# Patient Record
Sex: Female | Born: 1955 | Race: Black or African American | Hispanic: No | Marital: Married | Smoking: Former smoker
Health system: Southern US, Community
[De-identification: ages and names within clinical notes are randomized; demographics above are authoritative.]

## PROBLEM LIST (undated history)

## (undated) DIAGNOSIS — N186 End stage renal disease: Secondary | ICD-10-CM

## (undated) DIAGNOSIS — Z992 Dependence on renal dialysis: Secondary | ICD-10-CM

## (undated) DIAGNOSIS — F32A Depression, unspecified: Secondary | ICD-10-CM

## (undated) DIAGNOSIS — I10 Essential (primary) hypertension: Secondary | ICD-10-CM

## (undated) DIAGNOSIS — Z87440 Personal history of urinary (tract) infections: Secondary | ICD-10-CM

## (undated) DIAGNOSIS — Z8673 Personal history of transient ischemic attack (TIA), and cerebral infarction without residual deficits: Secondary | ICD-10-CM

## (undated) DIAGNOSIS — R58 Hemorrhage, not elsewhere classified: Secondary | ICD-10-CM

## (undated) DIAGNOSIS — D649 Anemia, unspecified: Secondary | ICD-10-CM

## (undated) DIAGNOSIS — E079 Disorder of thyroid, unspecified: Secondary | ICD-10-CM

## (undated) DIAGNOSIS — I5032 Chronic diastolic (congestive) heart failure: Secondary | ICD-10-CM

## (undated) DIAGNOSIS — E213 Hyperparathyroidism, unspecified: Secondary | ICD-10-CM

## (undated) DIAGNOSIS — I219 Acute myocardial infarction, unspecified: Secondary | ICD-10-CM

## (undated) DIAGNOSIS — G4733 Obstructive sleep apnea (adult) (pediatric): Secondary | ICD-10-CM

## (undated) DIAGNOSIS — F329 Major depressive disorder, single episode, unspecified: Secondary | ICD-10-CM

## (undated) DIAGNOSIS — E785 Hyperlipidemia, unspecified: Secondary | ICD-10-CM

## (undated) DIAGNOSIS — M1990 Unspecified osteoarthritis, unspecified site: Secondary | ICD-10-CM

## (undated) DIAGNOSIS — I6529 Occlusion and stenosis of unspecified carotid artery: Secondary | ICD-10-CM

## (undated) DIAGNOSIS — J189 Pneumonia, unspecified organism: Secondary | ICD-10-CM

## (undated) HISTORY — DX: Hyperlipidemia, unspecified: E78.5

## (undated) HISTORY — PX: ABDOMINAL HYSTERECTOMY: SHX81

## (undated) HISTORY — DX: Obstructive sleep apnea (adult) (pediatric): G47.33

## (undated) HISTORY — DX: Personal history of transient ischemic attack (TIA), and cerebral infarction without residual deficits: Z86.73

## (undated) HISTORY — DX: Personal history of urinary (tract) infections: Z87.440

## (undated) HISTORY — DX: Major depressive disorder, single episode, unspecified: F32.9

## (undated) HISTORY — DX: Occlusion and stenosis of unspecified carotid artery: I65.29

## (undated) HISTORY — PX: KIDNEY TRANSPLANT: SHX239

## (undated) HISTORY — PX: HERNIA REPAIR: SHX51

## (undated) HISTORY — DX: Anemia, unspecified: D64.9

## (undated) HISTORY — PX: CHOLECYSTECTOMY: SHX55

## (undated) HISTORY — DX: Hyperparathyroidism, unspecified: E21.3

## (undated) HISTORY — DX: Chronic diastolic (congestive) heart failure: I50.32

## (undated) HISTORY — DX: Essential (primary) hypertension: I10

## (undated) HISTORY — DX: Hemorrhage, not elsewhere classified: R58

## (undated) HISTORY — DX: Disorder of thyroid, unspecified: E07.9

## (undated) HISTORY — DX: Depression, unspecified: F32.A

## (undated) HISTORY — PX: OTHER SURGICAL HISTORY: SHX169

## (undated) HISTORY — PX: EYE SURGERY: SHX253

---

## 1998-11-23 ENCOUNTER — Emergency Department (HOSPITAL_COMMUNITY): Admission: EM | Admit: 1998-11-23 | Discharge: 1998-11-24 | Payer: Self-pay | Admitting: Emergency Medicine

## 2000-05-23 ENCOUNTER — Encounter: Admission: RE | Admit: 2000-05-23 | Discharge: 2000-08-21 | Payer: Self-pay | Admitting: Pulmonary Disease

## 2000-05-31 ENCOUNTER — Ambulatory Visit (HOSPITAL_COMMUNITY): Admission: RE | Admit: 2000-05-31 | Discharge: 2000-05-31 | Payer: Self-pay | Admitting: *Deleted

## 2000-06-02 ENCOUNTER — Emergency Department (HOSPITAL_COMMUNITY): Admission: EM | Admit: 2000-06-02 | Discharge: 2000-06-02 | Payer: Self-pay | Admitting: Emergency Medicine

## 2000-06-03 ENCOUNTER — Emergency Department (HOSPITAL_COMMUNITY): Admission: EM | Admit: 2000-06-03 | Discharge: 2000-06-03 | Payer: Self-pay | Admitting: Emergency Medicine

## 2000-11-02 ENCOUNTER — Encounter: Admission: RE | Admit: 2000-11-02 | Discharge: 2000-11-02 | Payer: Self-pay | Admitting: Orthopedic Surgery

## 2000-11-02 ENCOUNTER — Encounter: Payer: Self-pay | Admitting: Orthopedic Surgery

## 2000-11-21 ENCOUNTER — Encounter: Payer: Self-pay | Admitting: Orthopedic Surgery

## 2000-11-21 ENCOUNTER — Ambulatory Visit (HOSPITAL_COMMUNITY): Admission: RE | Admit: 2000-11-21 | Discharge: 2000-11-21 | Payer: Self-pay | Admitting: Orthopedic Surgery

## 2001-01-23 ENCOUNTER — Ambulatory Visit (HOSPITAL_COMMUNITY): Admission: RE | Admit: 2001-01-23 | Discharge: 2001-01-23 | Payer: Self-pay | Admitting: Gastroenterology

## 2001-02-15 ENCOUNTER — Encounter: Admission: RE | Admit: 2001-02-15 | Discharge: 2001-02-15 | Payer: Self-pay | Admitting: Internal Medicine

## 2001-02-15 ENCOUNTER — Encounter: Payer: Self-pay | Admitting: Internal Medicine

## 2001-03-20 ENCOUNTER — Encounter: Payer: Self-pay | Admitting: Emergency Medicine

## 2001-03-20 ENCOUNTER — Encounter: Admission: RE | Admit: 2001-03-20 | Discharge: 2001-03-20 | Payer: Self-pay | Admitting: Orthopedic Surgery

## 2001-03-20 ENCOUNTER — Emergency Department (HOSPITAL_COMMUNITY): Admission: EM | Admit: 2001-03-20 | Discharge: 2001-03-20 | Payer: Self-pay | Admitting: Emergency Medicine

## 2001-03-20 ENCOUNTER — Encounter: Payer: Self-pay | Admitting: Orthopedic Surgery

## 2001-04-20 ENCOUNTER — Ambulatory Visit (HOSPITAL_COMMUNITY): Admission: RE | Admit: 2001-04-20 | Discharge: 2001-04-20 | Payer: Self-pay | Admitting: Orthopedic Surgery

## 2001-04-20 ENCOUNTER — Encounter: Payer: Self-pay | Admitting: Orthopedic Surgery

## 2001-05-07 ENCOUNTER — Encounter: Admission: RE | Admit: 2001-05-07 | Discharge: 2001-05-16 | Payer: Self-pay | Admitting: Pulmonary Disease

## 2001-07-18 ENCOUNTER — Encounter: Admission: RE | Admit: 2001-07-18 | Discharge: 2001-07-18 | Payer: Self-pay

## 2001-07-18 ENCOUNTER — Encounter: Payer: Self-pay | Admitting: Internal Medicine

## 2001-08-22 ENCOUNTER — Other Ambulatory Visit: Admission: RE | Admit: 2001-08-22 | Discharge: 2001-08-22 | Payer: Self-pay | Admitting: Internal Medicine

## 2002-01-03 ENCOUNTER — Ambulatory Visit (HOSPITAL_COMMUNITY): Admission: RE | Admit: 2002-01-03 | Discharge: 2002-01-03 | Payer: Self-pay | Admitting: Gastroenterology

## 2002-01-03 ENCOUNTER — Encounter (INDEPENDENT_AMBULATORY_CARE_PROVIDER_SITE_OTHER): Payer: Self-pay | Admitting: Specialist

## 2002-02-11 ENCOUNTER — Encounter: Payer: Self-pay | Admitting: Internal Medicine

## 2002-02-11 ENCOUNTER — Encounter: Admission: RE | Admit: 2002-02-11 | Discharge: 2002-02-11 | Payer: Self-pay | Admitting: Internal Medicine

## 2002-02-26 ENCOUNTER — Ambulatory Visit (HOSPITAL_COMMUNITY): Admission: RE | Admit: 2002-02-26 | Discharge: 2002-02-26 | Payer: Self-pay | Admitting: Internal Medicine

## 2002-03-18 ENCOUNTER — Encounter: Payer: Self-pay | Admitting: Internal Medicine

## 2002-03-18 ENCOUNTER — Inpatient Hospital Stay (HOSPITAL_COMMUNITY): Admission: RE | Admit: 2002-03-18 | Discharge: 2002-03-21 | Payer: Self-pay | Admitting: Internal Medicine

## 2002-03-19 ENCOUNTER — Encounter (INDEPENDENT_AMBULATORY_CARE_PROVIDER_SITE_OTHER): Payer: Self-pay | Admitting: Cardiology

## 2002-07-07 ENCOUNTER — Encounter: Payer: Self-pay | Admitting: Emergency Medicine

## 2002-07-07 ENCOUNTER — Emergency Department (HOSPITAL_COMMUNITY): Admission: EM | Admit: 2002-07-07 | Discharge: 2002-07-07 | Payer: Self-pay | Admitting: Emergency Medicine

## 2003-02-05 ENCOUNTER — Ambulatory Visit (HOSPITAL_COMMUNITY): Admission: RE | Admit: 2003-02-05 | Discharge: 2003-02-05 | Payer: Self-pay | Admitting: Gastroenterology

## 2003-02-05 ENCOUNTER — Encounter (INDEPENDENT_AMBULATORY_CARE_PROVIDER_SITE_OTHER): Payer: Self-pay | Admitting: Specialist

## 2003-02-27 ENCOUNTER — Ambulatory Visit (HOSPITAL_COMMUNITY): Admission: RE | Admit: 2003-02-27 | Discharge: 2003-02-27 | Payer: Self-pay | Admitting: Internal Medicine

## 2003-02-27 ENCOUNTER — Encounter: Payer: Self-pay | Admitting: Internal Medicine

## 2003-09-28 ENCOUNTER — Inpatient Hospital Stay (HOSPITAL_COMMUNITY): Admission: EM | Admit: 2003-09-28 | Discharge: 2003-10-01 | Payer: Self-pay | Admitting: *Deleted

## 2005-07-21 ENCOUNTER — Encounter: Admission: RE | Admit: 2005-07-21 | Discharge: 2005-07-21 | Payer: Self-pay | Admitting: Nephrology

## 2005-08-02 ENCOUNTER — Encounter (HOSPITAL_COMMUNITY): Admission: RE | Admit: 2005-08-02 | Discharge: 2005-10-31 | Payer: Self-pay | Admitting: Nephrology

## 2005-11-09 ENCOUNTER — Encounter (HOSPITAL_COMMUNITY): Admission: RE | Admit: 2005-11-09 | Discharge: 2006-02-07 | Payer: Self-pay | Admitting: Nephrology

## 2006-02-17 ENCOUNTER — Encounter (HOSPITAL_COMMUNITY): Admission: RE | Admit: 2006-02-17 | Discharge: 2006-05-18 | Payer: Self-pay | Admitting: Nephrology

## 2006-03-02 ENCOUNTER — Emergency Department (HOSPITAL_COMMUNITY): Admission: EM | Admit: 2006-03-02 | Discharge: 2006-03-02 | Payer: Self-pay | Admitting: Emergency Medicine

## 2006-06-02 ENCOUNTER — Encounter: Admission: RE | Admit: 2006-06-02 | Discharge: 2006-06-02 | Payer: Self-pay | Admitting: Internal Medicine

## 2006-06-20 ENCOUNTER — Encounter (HOSPITAL_COMMUNITY): Admission: RE | Admit: 2006-06-20 | Discharge: 2006-09-18 | Payer: Self-pay | Admitting: Nephrology

## 2006-10-31 ENCOUNTER — Encounter (HOSPITAL_COMMUNITY): Admission: RE | Admit: 2006-10-31 | Discharge: 2007-01-29 | Payer: Self-pay | Admitting: Nephrology

## 2007-02-26 ENCOUNTER — Encounter (HOSPITAL_COMMUNITY): Admission: RE | Admit: 2007-02-26 | Discharge: 2007-05-27 | Payer: Self-pay | Admitting: Nephrology

## 2007-06-19 ENCOUNTER — Emergency Department (HOSPITAL_COMMUNITY): Admission: EM | Admit: 2007-06-19 | Discharge: 2007-06-19 | Payer: Self-pay | Admitting: Emergency Medicine

## 2007-06-21 ENCOUNTER — Encounter (HOSPITAL_COMMUNITY): Admission: RE | Admit: 2007-06-21 | Discharge: 2007-09-19 | Payer: Self-pay | Admitting: Nephrology

## 2007-07-06 ENCOUNTER — Encounter: Admission: RE | Admit: 2007-07-06 | Discharge: 2007-07-06 | Payer: Self-pay | Admitting: Internal Medicine

## 2007-07-21 ENCOUNTER — Observation Stay (HOSPITAL_COMMUNITY): Admission: EM | Admit: 2007-07-21 | Discharge: 2007-07-22 | Payer: Self-pay | Admitting: Emergency Medicine

## 2007-07-21 ENCOUNTER — Encounter (INDEPENDENT_AMBULATORY_CARE_PROVIDER_SITE_OTHER): Payer: Self-pay | Admitting: Neurology

## 2007-09-20 ENCOUNTER — Encounter (HOSPITAL_COMMUNITY): Admission: RE | Admit: 2007-09-20 | Discharge: 2007-12-19 | Payer: Self-pay | Admitting: Nephrology

## 2007-12-20 ENCOUNTER — Encounter (HOSPITAL_COMMUNITY): Admission: RE | Admit: 2007-12-20 | Discharge: 2008-03-19 | Payer: Self-pay | Admitting: Nephrology

## 2008-02-27 ENCOUNTER — Emergency Department (HOSPITAL_COMMUNITY): Admission: EM | Admit: 2008-02-27 | Discharge: 2008-02-27 | Payer: Self-pay | Admitting: Emergency Medicine

## 2008-04-15 ENCOUNTER — Encounter (HOSPITAL_COMMUNITY): Admission: RE | Admit: 2008-04-15 | Discharge: 2008-07-14 | Payer: Self-pay | Admitting: Nephrology

## 2008-05-06 LAB — HM COLONOSCOPY

## 2008-06-17 ENCOUNTER — Emergency Department (HOSPITAL_COMMUNITY): Admission: EM | Admit: 2008-06-17 | Discharge: 2008-06-17 | Payer: Self-pay | Admitting: Emergency Medicine

## 2008-07-21 ENCOUNTER — Encounter: Admission: RE | Admit: 2008-07-21 | Discharge: 2008-07-21 | Payer: Self-pay | Admitting: Internal Medicine

## 2008-07-30 ENCOUNTER — Encounter (HOSPITAL_COMMUNITY): Admission: RE | Admit: 2008-07-30 | Discharge: 2008-10-16 | Payer: Self-pay | Admitting: Nephrology

## 2008-10-17 ENCOUNTER — Encounter (HOSPITAL_COMMUNITY): Admission: RE | Admit: 2008-10-17 | Discharge: 2009-01-15 | Payer: Self-pay | Admitting: Nephrology

## 2009-02-16 ENCOUNTER — Encounter (HOSPITAL_COMMUNITY): Admission: RE | Admit: 2009-02-16 | Discharge: 2009-05-17 | Payer: Self-pay | Admitting: Nephrology

## 2009-05-18 ENCOUNTER — Encounter (HOSPITAL_COMMUNITY): Admission: RE | Admit: 2009-05-18 | Discharge: 2009-08-16 | Payer: Self-pay | Admitting: Nephrology

## 2009-07-22 ENCOUNTER — Encounter: Admission: RE | Admit: 2009-07-22 | Discharge: 2009-07-22 | Payer: Self-pay | Admitting: Internal Medicine

## 2009-09-16 ENCOUNTER — Encounter (HOSPITAL_COMMUNITY): Admission: RE | Admit: 2009-09-16 | Discharge: 2009-12-15 | Payer: Self-pay | Admitting: Nephrology

## 2009-12-14 ENCOUNTER — Encounter: Payer: Self-pay | Admitting: Internal Medicine

## 2009-12-21 ENCOUNTER — Observation Stay (HOSPITAL_COMMUNITY): Admission: EM | Admit: 2009-12-21 | Discharge: 2009-12-23 | Payer: Self-pay | Admitting: Emergency Medicine

## 2010-01-08 ENCOUNTER — Encounter (HOSPITAL_COMMUNITY): Admission: RE | Admit: 2010-01-08 | Discharge: 2010-04-08 | Payer: Self-pay | Admitting: Nephrology

## 2010-01-20 ENCOUNTER — Encounter: Payer: Self-pay | Admitting: Internal Medicine

## 2010-01-29 ENCOUNTER — Emergency Department (HOSPITAL_COMMUNITY): Admission: EM | Admit: 2010-01-29 | Discharge: 2010-01-29 | Payer: Self-pay | Admitting: Emergency Medicine

## 2010-05-21 ENCOUNTER — Encounter: Payer: Self-pay | Admitting: Internal Medicine

## 2010-05-27 ENCOUNTER — Ambulatory Visit: Payer: Self-pay | Admitting: Internal Medicine

## 2010-05-27 ENCOUNTER — Encounter: Payer: Self-pay | Admitting: Internal Medicine

## 2010-05-27 DIAGNOSIS — R109 Unspecified abdominal pain: Secondary | ICD-10-CM

## 2010-05-27 DIAGNOSIS — E119 Type 2 diabetes mellitus without complications: Secondary | ICD-10-CM | POA: Insufficient documentation

## 2010-05-27 DIAGNOSIS — M79609 Pain in unspecified limb: Secondary | ICD-10-CM

## 2010-05-27 DIAGNOSIS — E785 Hyperlipidemia, unspecified: Secondary | ICD-10-CM

## 2010-05-27 DIAGNOSIS — I10 Essential (primary) hypertension: Secondary | ICD-10-CM

## 2010-05-27 DIAGNOSIS — N186 End stage renal disease: Secondary | ICD-10-CM | POA: Insufficient documentation

## 2010-05-27 DIAGNOSIS — D631 Anemia in chronic kidney disease: Secondary | ICD-10-CM

## 2010-05-27 DIAGNOSIS — K5909 Other constipation: Secondary | ICD-10-CM

## 2010-05-27 DIAGNOSIS — N189 Chronic kidney disease, unspecified: Secondary | ICD-10-CM

## 2010-05-27 LAB — CONVERTED CEMR LAB
Bilirubin Urine: NEGATIVE
Blood Glucose, AC Bkfst: 157 mg/dL
Blood in Urine, dipstick: NEGATIVE
Glucose, Urine, Semiquant: NEGATIVE
Hgb A1c MFr Bld: 7.8 %
Ketones, urine, test strip: NEGATIVE
Nitrite: POSITIVE
Specific Gravity, Urine: 1.01
Urobilinogen, UA: 0.2
pH: 5

## 2010-05-27 LAB — HM DIABETES FOOT EXAM

## 2010-05-28 LAB — CONVERTED CEMR LAB
ALT: 14 units/L (ref 0–35)
AST: 13 units/L (ref 0–37)
Albumin: 4.1 g/dL (ref 3.5–5.2)
Alkaline Phosphatase: 90 units/L (ref 39–117)
BUN: 49 mg/dL — ABNORMAL HIGH (ref 6–23)
CO2: 23 meq/L (ref 19–32)
Calcium, Total (PTH): 10 mg/dL (ref 8.4–10.5)
Calcium: 9.9 mg/dL (ref 8.4–10.5)
Chloride: 103 meq/L (ref 96–112)
Cholesterol: 286 mg/dL — ABNORMAL HIGH (ref 0–200)
Creatinine, Ser: 2 mg/dL — ABNORMAL HIGH (ref 0.40–1.20)
Creatinine, Urine: 40.4 mg/dL
Glucose, Bld: 128 mg/dL — ABNORMAL HIGH (ref 70–99)
HCT: 36.2 % (ref 36.0–46.0)
HDL: 48 mg/dL (ref >39)
Hemoglobin: 11.2 g/dL — ABNORMAL LOW (ref 12.0–15.0)
LDL Cholesterol: 208 mg/dL — ABNORMAL HIGH (ref 0–99)
MCHC: 30.9 g/dL (ref 30.0–36.0)
MCV: 84.8 fL (ref >=78.0)
Microalb Creat Ratio: 261.1 mg/g — ABNORMAL HIGH (ref 0.0–30.0)
Microalb, Ur: 10.55 mg/dL — ABNORMAL HIGH (ref 0.00–1.89)
PTH: 97 pg/mL — ABNORMAL HIGH (ref 14.0–72.0)
Platelets: 341 10*3/uL (ref 150–400)
Potassium: 5.4 meq/L — ABNORMAL HIGH (ref 3.5–5.3)
RBC: 4.27 M/uL (ref 3.87–5.11)
RDW: 14.3 % (ref 11.5–15.5)
Sodium: 138 meq/L (ref 135–145)
Total Bilirubin: 0.4 mg/dL (ref 0.3–1.2)
Total CHOL/HDL Ratio: 6
Total Protein: 7.7 g/dL (ref 6.0–8.3)
Triglycerides: 149 mg/dL (ref <150)
VLDL: 30 mg/dL (ref 0–40)
WBC: 11.9 10*3/uL — ABNORMAL HIGH (ref 4.0–10.5)

## 2010-06-08 ENCOUNTER — Encounter: Payer: Self-pay | Admitting: Internal Medicine

## 2010-06-08 ENCOUNTER — Ambulatory Visit: Payer: Self-pay | Admitting: Internal Medicine

## 2010-06-09 LAB — CONVERTED CEMR LAB
BUN: 49 mg/dL — ABNORMAL HIGH (ref 6–23)
CO2: 24 meq/L (ref 19–32)
Calcium: 9.4 mg/dL (ref 8.4–10.5)
Chloride: 103 meq/L (ref 96–112)
Creatinine, Ser: 1.99 mg/dL — ABNORMAL HIGH (ref 0.40–1.20)
Glucose, Bld: 65 mg/dL — ABNORMAL LOW (ref 70–99)
Potassium: 4.3 meq/L (ref 3.5–5.3)
Sodium: 138 meq/L (ref 135–145)

## 2010-06-15 ENCOUNTER — Telehealth: Payer: Self-pay | Admitting: Internal Medicine

## 2010-06-30 ENCOUNTER — Telehealth: Payer: Self-pay | Admitting: *Deleted

## 2010-07-07 ENCOUNTER — Encounter: Payer: Self-pay | Admitting: Internal Medicine

## 2010-07-28 ENCOUNTER — Ambulatory Visit (HOSPITAL_COMMUNITY): Admission: RE | Admit: 2010-07-28 | Discharge: 2010-07-28 | Payer: Self-pay | Admitting: Cardiology

## 2010-07-28 ENCOUNTER — Encounter (INDEPENDENT_AMBULATORY_CARE_PROVIDER_SITE_OTHER): Payer: Self-pay | Admitting: Cardiology

## 2010-08-20 ENCOUNTER — Telehealth: Payer: Self-pay | Admitting: Internal Medicine

## 2010-09-01 ENCOUNTER — Encounter (HOSPITAL_COMMUNITY)
Admission: RE | Admit: 2010-09-01 | Discharge: 2010-11-16 | Payer: Self-pay | Source: Home / Self Care | Attending: Nephrology | Admitting: Nephrology

## 2010-10-11 ENCOUNTER — Emergency Department (HOSPITAL_COMMUNITY)
Admission: EM | Admit: 2010-10-11 | Discharge: 2010-10-11 | Payer: Self-pay | Source: Home / Self Care | Admitting: Family Medicine

## 2010-10-13 ENCOUNTER — Emergency Department (HOSPITAL_COMMUNITY)
Admission: EM | Admit: 2010-10-13 | Discharge: 2010-10-13 | Payer: Self-pay | Source: Home / Self Care | Admitting: Family Medicine

## 2010-10-16 ENCOUNTER — Emergency Department (HOSPITAL_COMMUNITY)
Admission: EM | Admit: 2010-10-16 | Discharge: 2010-10-16 | Payer: Self-pay | Source: Home / Self Care | Admitting: Family Medicine

## 2010-10-19 ENCOUNTER — Emergency Department (HOSPITAL_COMMUNITY)
Admission: EM | Admit: 2010-10-19 | Discharge: 2010-10-19 | Payer: Self-pay | Source: Home / Self Care | Admitting: Family Medicine

## 2010-10-26 ENCOUNTER — Emergency Department (HOSPITAL_COMMUNITY)
Admission: EM | Admit: 2010-10-26 | Discharge: 2010-10-26 | Payer: Self-pay | Source: Home / Self Care | Admitting: Family Medicine

## 2010-11-18 NOTE — Progress Notes (Signed)
Summary: [facility] appt/ [facility]  Phone Note Call from Patient   Summary of Call: pt calls to say her cardiology appt is 07/22/2010 at 0930 at [facility] w/ dr Alford Highland Initial call taken by: Freddy Finner RN,  June 15, 2010 10:43 AM  Follow-up for Phone Call        Thank you - noted. Follow-up by: Rikki Spearing, MD,  June 15, 2010 11:20 AM

## 2010-11-18 NOTE — Assessment & Plan Note (Signed)
Summary: NEW PT/DIABETIC/RECORDS W/CHILON/DS   Vital Signs:  Patient profile:   55 year [name] female Height:      65 inches (165.10 cm) Weight:      207.7 pounds (94.41 kg) BMI:     34.69 Temp:     98.2 degrees F oral Pulse rate:   83 / minute BP sitting:   155 / 85  (right arm)  Vitals Entered By: Danielle Old RN (May 27, 2010 8:49 AM) CC: New to the clinic. Haing pain left flank and upper back. Is Patient Diabetic? Yes Did you bring your meter with you today? Yes Pain Assessment Patient in pain? yes     Location: left flank/back Intensity: 7 Type: sharp Onset of pain  Intermittent Nutritional Status BMI of > 30 = obese  Have you ever been in a relationship where you felt threatened, hurt or afraid?Unable to ask; daughter w/pt.   Does patient need assistance? Functional Status Self care Ambulation Normal   Primary Care Provider:  Rikki Spearing, MD  CC:  New to the clinic. Haing pain left flank and upper back..  History of Present Illness: Patient is a new patient to the clinic and would like to establish care with [place]. Patient has h/o DM (on insulin), HTN, hyperlipidemia, and Grade 3 renal failure (not on dialysis).   Her job ended and so now she is uninsured.  Previously seen by Dr. Baird [name].  The patient ses Dr. Jane [name] (renal)  - she is sponsored by [employer] for 1 year to receive her epogen.  Dr. Carlis [name] (endocrinology) is following her diabetes. She will continue to see renal and endocrine physicians through payment plan..    For the past 5 days the patient has had pain in her upper back that wraps around her front on the left side.  She wakes up with it hurting and it can last through the day.  Sharp pain.  Waxing and waning.  Tried tylenol and this helps some. No dyruria.  Increased frequency.  Increased volume of urine.  No fevers.  With this some periumbillical pain that is sharp and waxing and waning.  Leg pain for the past 2 months; starts in her  lateral left ankle, shoots up her leg and into her back.  It lasts until she leans over, and it will gradually go away.  Sharp pain.  Leaning forward is the only thing that makes it better.  She has tried taking tylenol for it and it does help. No bowel or bladder incontinence.  Patient states she has long standing numbness and tingling in her feet.  This happens 2x/week and is increasing in frequency.  Patient is scared to cut her toenails right now because she has injured them recently - she has seen a podiatrist in the past.  Patient is chronically constipated - has used Miralax in the past but concerned that might be bad for kidneys.  Currently uses OTC stool softeners with good relief.   Depression History:      The patient denies a depressed mood most of the day and a diminished interest in her usual daily activities.         Preventive Screening-Counseling & Management  Alcohol-Tobacco     Alcohol drinks/day: 0     Smoking Status: quit     Year Quit: 28 yrs ago  Caffeine-Diet-Exercise     Does Patient Exercise: no  Current Medications (verified): 1)  Novolog 100 Unit/ml Soln (Insulin Aspart) .Marland Kitchen.. 12u in  The Am, 12u At Lunch, and 18u in The Evening 2)  Lantus 100 Unit/ml Soln (Insulin Glargine) .... 30u in The Evening 3)  Norvasc 10 Mg Tabs (Amlodipine Besylate) .... Take One Tablet By Mouth Daily 4)  Coreg 25 Mg Tabs (Carvedilol) .... Take 1 Tablet By Mouth Twice Daily 5)  Furosemide 40 Mg Tabs (Furosemide) .... Take One Tablet By Mouth in The Morning 6)  Simvastatin 20 Mg Tabs (Simvastatin) .... Take One Tablet By Mouth Daily 7)  Procrit 10000 Unit/ml Soln (Epoetin Alfa) .Marland Kitchen.. 10000u Injection Every 2 Weeks 8)  Fish Oil 1000 Mg Caps (Omega-3 Fatty Acids) .... Take 2 Tablets By Mouth Twice Daily 9)  Lisinopril 10 Mg Tabs (Lisinopril) .... Take One Tablet By Mouth Daily 10)  Ciprofloxacin Hcl 250 Mg Tabs (Ciprofloxacin Hcl) .... Take 1 Tablet By Mouth Twice Daily 11)  Aspir-Low  81 Mg Tbec (Aspirin) .... Take One Tablet By Mouth Daily  Allergies (verified): 1)  ! Codeine  Past History:  Past Medical History: DM, type II - diagnosed in 1988; on insulin (HgbA1c March 2011 11.9) HTN hyperlipidemia (March 2011 Chol 225; LDL 148 HDL 49 TG 142) kidney failure - stage III 2/2 DM and HTN (last creatinine in March 2011 was 2.17 and this appears to be around her baseline with eGFR 28) h/o CHF - diagnosed in 2003 during hospitalization, ECHO Sept 2009 showed EF50-55%, impared LV relaxation, mild calcification of mitral valve, mild mitral regurg, mild tricuspid regurg, mildly sclerotic aortic valve with trace aortic regurgitation s/p heart catheterization July 2003 showing "normal coronary arteries" anemia 2/2 renal failure (March 2011 Hgb 10.7) Depression h/o TIA - October 2008 - "twisted face" and slurred speech without extremity weakness h/o retinal bleed on full-dose aspirin h/o MI h/o UTIs OSA - patient does not use her CPAP because it is uncomfortable; Medium full face mask with heated humidification and CPAP 10cm H2O diabetic retinopathy s/p PRP OS last colonoscopy 2009 with polyp removal - pt next due for colonoscopy in 2014  Past Surgical History: TAH/BSO - 1974 - for heavy bleeding/infection; last female exam July 2010 wnl cholescystectomy - 1984 hernia repair - 1985  Family History: mother - [name] (lung, smoker), DM, HTN, CHF (died at 46) father - DM, HTN, CHF (died at 12) both grandmothers - HTN son (9 years [name]) - DM, CHF, HTN  sister - breast [name] No family history of colon [name]  Social History: Recently unemployed from the health department, and she is actively looking for work.  She lives with husband (monogamous sexual partner) and 2 grandsons.  She has 2 adult children.  Patient is receiving unemployment at the moment; her husband is unemployed.  Quit smoking cigarettes 28 years ago; smoked 4-5 years  ~1ppd. Alcohol - none Drugs -  noneSmoking Status:  quit Does Patient Exercise:  no  Physical Exam  General:  NAD Head:  normocephalic.   Eyes:  pupils equal, pupils round, and pupils reactive to light.   Ears:  no external deformities.   Nose:  no external erythema and no nasal discharge.   Mouth:  pharynx pink and moist, fair dentition, and poor dentition.   Neck:  supple, full ROM, and no masses.   Lungs:  normal respiratory effort, normal breath sounds, no crackles, and no wheezes.   Heart:  normal rate, regular rhythm, and no murmur.   Abdomen:  soft and normal bowel sounds.  Mildly TTP LLQ and LRQ. No rebound/guarding.  + bilateral flank tenderness Msk:  normal ROM, no joint tenderness, no joint swelling, and no joint warmth.   Pulses:  2+ bilateral pedal pulses Extremities:  trace, non-pitting pretibial edema to the knees bilaterally Neurologic:  alert & oriented X3 and cranial nerves II-XII intact.  decreased sensation in bilateral feet (cannot feel monofilament at all and decreased to sharp touch).  Diabetes Management Exam:    Foot Exam (with socks and/or shoes not present):       Sensory-Pinprick/Light touch:          Left medial foot (L-4): absent          Left dorsal foot (L-5): absent          Left lateral foot (S-1): absent          Right medial foot (L-4): absent          Right dorsal foot (L-5): absent          Right lateral foot (S-1): absent       Sensory-Monofilament:          Left foot: absent          Right foot: absent       Inspection:          Left foot: normal          Right foot: normal       Nails:          Left foot: too long          Right foot: too long   Impression & Recommendations:  Problem # 1:  FLANK PAIN (ICD-789.09) Patient today presents with left sided flank pain in setting of increased nitrites and leukocytes on UA.  She has been afebrile and VSS.  We will obtain urine culture, however go ahead and treat for presumed left-sided pyelonephritis as an outpatient.   Patient has h/o UTIs in the past, treated with ciprofloxiacin.  Will Rx Cipro 250mg  by mouth two times a day x14 days (renal adjusted dose, given patient's last estimated GFR of 28).  Orders: T-Culture, Urine [id number])  Problem # 2:  ESSENTIAL HYPERTENSION (ICD-401.9) The patient is not at goal today (which would be <130/80); given the patient has kidney failure, she would benefit over the long term with addition of an ACE-I.  We will check the patient's renal function today as part of the C-Met; her last Cr that we have from the notes she brought from her previous clinic is 2.17 on 12/14/09.  If she is not in acute on chronic renal failure, we will start lisinopril 10 daily and have her return in 1 week for repeat B-Met.  She can continue her other medicines (norvasc, coreg, and lasix) for now.  She is expecting our return phone call with permission to start taking her lisinopril (or not).  Her updated medication list for this problem includes:    Norvasc 10 Mg Tabs (Amlodipine besylate) .Marland Kitchen... Take one tablet by mouth daily    Coreg 25 Mg Tabs (Carvedilol) .Marland Kitchen... Take 1 tablet by mouth twice daily    Furosemide 40 Mg Tabs (Furosemide) .Marland Kitchen... Take one tablet by mouth in the morning    Lisinopril 10 Mg Tabs (Lisinopril) .Marland Kitchen... Take one tablet by mouth daily  Problem # 3:  KIDNEY FAILURE (ICD-586) Patient is followed by nephrology for her kidney failure, which is Grade 3, given her most recent eGFR of 28 and Creatinine of 2.17 (which appears to be near her baseline).  The exact etiology of her renal  failure is unknown to [place], though it is very likely 2/2 HTN and DM.  Today we will check BUN/Cr and also an intact PTH.  (increased PTH can be cardiac risk factor) Orders: T-Parathyroid Hormone, Intact w/ Calcium [id number])  Problem # 4:  DIABETES MELLITUS (ICD-250.00) The patient is followed by endocrinology for this problem.  Her HgbA1c is 7.8 today which the patient states is much better than it  has been in the past.  She is compliant with her insulin regimen.  The patient was not taking a daily aspirin.  There is a note in her records about a retinal hemorrhage while on full dose aspirin in the past, however, her coronary health trumps any negative ocular effects that may or may not be related to the patient's aspirin use, so we will restart her on a daily baby aspirin today. The patient is due for her yearly eye exam, however she is uninsured and does not qualify for the orange card, so this will be difficult to get done.  The same is true for the patient's need to see a podiatrist regarding her long toenails.  Her foot exam today showed significant sensory loss of both feet, however the skin was clean, dry and intact.  The patient was counseled on proper foot care and daily foot exams. We will also check a urine creatinine/microalbumin ratio today.  Her updated medication list for this problem includes:    Novolog 100 Unit/ml Soln (Insulin aspart) .Marland Kitchen... 12u in the am, 12u at lunch, and 18u in the evening    Lantus 100 Unit/ml Soln (Insulin glargine) .Marland KitchenMarland KitchenMarland KitchenMarland Kitchen 30u in the evening    Lisinopril 10 Mg Tabs (Lisinopril) .Marland Kitchen... Take one tablet by mouth daily    Aspir-low 81 Mg Tbec (Aspirin) .Marland Kitchen... Take one tablet by mouth daily  Orders: T- Capillary Blood Glucose [id number]) T-Hgb A1C (in-house) [id number]) T-Urine Microalbumin w/creat. ratio [phone]) Ophthalmology Referral (Ophthalmology)  Problem # 5:  Preventive Health Care (ICD-V70.0) Breast [name]:  The patient has a Risk analyst risk = 2.2% - we can consider discussing chemophrophylaxis with tamoxifen with her in the future, given that her risk is >1.66%.    Colon [name]:  Next colonoscopy due in 2014.  She has no family h/o colon [name] but did have non-cancerous polyps removed in 2009.  Pap Smear:  She has had a TAH/BSO. Last female exam in 2010 was wnl.  Immunizations:  She has had a tetanus shot in the last 10 years.    Problem  # 6:  HYPERLIPIDEMIA ([id number]) The patient is fasting today so we will check an FLP and see if we need to adjust her statin dose.  We will evaluate her liver function with the CMP.  Her updated medication list for this problem includes:    Simvastatin 20 Mg Tabs (Simvastatin) .Marland Kitchen... Take one tablet by mouth daily  Orders: T-Lipid Profile [id number])  Problem # 7:  LEG PAIN, RIGHT (ICD-729.5) The patient is complaining of transient 2x/week right leg pain for the past 2 months that is relieved by leaning over and tylenol.  She does not have any new neurologic symptoms.  The patient was reassure that this is likely musculoskeltal in nature and she was encouraged to continue to use tylenol for pain.  We will continue to monitor this symptom.  Imaging is not warranted at this time.    Complete Medication List: 1)  Novolog 100 Unit/ml Soln (Insulin aspart) .Marland Kitchen.. 12u in the am, 12u at lunch, and 18u  in the evening 2)  Lantus 100 Unit/ml Soln (Insulin glargine) .... 30u in the evening 3)  Norvasc 10 Mg Tabs (Amlodipine besylate) .... Take one tablet by mouth daily 4)  Coreg 25 Mg Tabs (Carvedilol) .... Take 1 tablet by mouth twice daily 5)  Furosemide 40 Mg Tabs (Furosemide) .... Take one tablet by mouth in the morning 6)  Simvastatin 20 Mg Tabs (Simvastatin) .... Take one tablet by mouth daily 7)  Procrit 10000 Unit/ml Soln (Epoetin alfa) .Marland Kitchen.. 10000u injection every 2 weeks 8)  Fish Oil 1000 Mg Caps (Omega-3 fatty acids) .... Take 2 tablets by mouth twice daily 9)  Lisinopril 10 Mg Tabs (Lisinopril) .... Take one tablet by mouth daily 10)  Ciprofloxacin Hcl 250 Mg Tabs (Ciprofloxacin hcl) .... Take 1 tablet by mouth twice daily 11)  Aspir-low 81 Mg Tbec (Aspirin) .... Take one tablet by mouth daily  Other Orders: T-CBC No Diff [id number]) T-Comprehensive Metabolic Panel ([id number])  Patient Instructions: 1)  Please take the antibiotic ciprofloxacin twice a day for 14 days. 2)  Today you  had bloodwork and I will call you if there is anything abnormal.   3)  Today I am going to start you on a new blood pressure medicine called lisinopril.  Do not start taking it until I call you and tell you it is ok.  Then, after you start taking it, you will need to come back and see me and get labwork again after one week of taking the medicine. 4)  Start taking a baby aspirin every day. Prescriptions: CIPROFLOXACIN HCL 250 MG TABS (CIPROFLOXACIN HCL) Take 1 tablet by mouth twice daily  #28 x 0   Entered and Authorized by:   Danielle Spearing, MD   Signed by:   Danielle Spearing, MD on 05/27/2010   Method used:   Electronically to        [facility] [phone]* (retail)       [address]       [place], [place]  [address]       Ph: [phone]       Fax: [phone]   RxIDZV:[id number] LISINOPRIL 10 MG TABS (LISINOPRIL) Take one tablet by mouth daily  #30 x 5   Entered and Authorized by:   Danielle Spearing, MD   Signed by:   Danielle Spearing, MD on 05/27/2010   Method used:   Electronically to        [facility] [phone]* (retail)       [address]       [place], [place]  [address]       Ph: [phone]       Fax: [phone]   RxIDOD:[id number]   Prevention & Chronic Care Immunizations   Influenza vaccine: Not documented    Tetanus booster: Not documented    Pneumococcal vaccine: Not documented  Colorectal Screening   Hemoccult: Not documented    Colonoscopy: Not documented  Other Screening   Pap smear: Not documented    Mammogram: Not documented   Smoking status: quit  (05/27/2010)  Diabetes Mellitus   HgbA1C: 7.8  (05/27/2010)    Eye exam: Not documented   Diabetic eye exam action/deferral: Ophthalmology referral  (05/27/2010)    Foot exam: yes  (05/27/2010)   High risk foot: Not documented   Foot care education: Not documented    Urine microalbumin/creatinine ratio: Not documented   Urine microalbumin action/deferral: Ordered     Diabetes  flowsheet reviewed?: Yes   Progress toward A1C goal: Improved  Lipids   Total Cholesterol: Not documented   Lipid panel action/deferral: Lipid Panel ordered   LDL: Not documented   LDL Direct: Not documented   HDL: Not documented   Triglycerides: Not documented    SGOT (AST): Not documented   BMP action: Ordered   SGPT (ALT): Not documented CMP ordered    Alkaline phosphatase: Not documented   Total bilirubin: Not documented  Hypertension   Last Blood Pressure: 155 / 85  (05/27/2010)   Serum creatinine: Not documented   BMP action: Ordered   Serum potassium Not documented CMP ordered     Hypertension flowsheet reviewed?: Yes   Progress toward BP goal: Deteriorated  Self-Management Support :    Diabetes self-management support: Not documented    Hypertension self-management support: Not documented    Lipid self-management support: Not documented    Nursing Instructions: Refer for screening diabetic eye exam (see order)   Process Orders Check Orders Results:     [facility]: ABN not required for this insurance Tests Sent for requisitioning (May 27, 2010 2:08 PM):     05/27/2010: [facility] -- T-CBC No Diff [id number] (signed)     05/27/2010: [facility] -- T-Parathyroid Hormone, Intact w/ Calcium [id number] (signed)     05/27/2010: [facility] -- T-Urine Microalbumin w/creat. ratio [[id number]] (signed)     05/27/2010: [facility] -- T-Lipid Profile [phone] (signed)     05/27/2010: [facility] -- T-Comprehensive Metabolic Panel [id number] (signed)     05/27/2010: [facility] -- T-Culture, Urine [id number] (signed)    Laboratory Results   Urine Tests  Date/Time Received: 05/27/10 9:51AM Date/Time Reported: same  Routine Urinalysis   Color: lt. yellow Appearance: Clear Glucose: negative   (Normal Range:  Negative) Bilirubin: negative   (Normal Range: Negative) Ketone: negative   (Normal Range: Negative) Spec. Gravity: 1.010   (Normal Range: 1.003-1.035) Blood: negative   (Normal Range: Negative) pH: 5.0   (Normal Range: 5.0-8.0) Protein: trace   (Normal Range: Negative) Urobilinogen: 0.2   (Normal Range: 0-1) Nitrite: positive   (Normal Range: Negative) Leukocyte Esterace: trace   (Normal Range: Negative)     Blood Tests   Date/Time Received: May 27, 2010 9:14 AM  Date/Time Reported: Lenoria Farrier  May 27, 2010 9:14 AM   HGBA1C: 7.8%   (Normal Range: Non-Diabetic - 3-6%   Control Diabetic - 6-8%) CBG Fasting:: 157mg /dL        Process Orders Check Orders Results:     [facility]: ABN not required for this insurance Tests Sent for requisitioning (May 27, 2010 2:08 PM):     05/27/2010: [facility] -- T-CBC No Diff [id number] (signed)     05/27/2010: [facility] -- T-Parathyroid Hormone, Intact w/ Calcium [id number] (signed)     05/27/2010: [facility] -- T-Urine Microalbumin w/creat. ratio [[id number]] (signed)     05/27/2010: [facility] -- T-Lipid Profile [phone] (signed)     05/27/2010: [facility] -- T-Comprehensive Metabolic Panel [id number] (signed)     05/27/2010: [facility] -- T-Culture, Urine [id number] (signed)    Appended Document: NEW PT/DIABETIC/RECORDS W/CHILON/DS [name] hsitory and physical examination were reviewed with Dr. Shon Baton and we jointly formulated her assessment and plan.  I agree with her documentation above.  [name] recent eGFR of 28 places her in the Stage 3/4 range and this will be repeated  today.  If she has chronic kidney disease she may benefit from the initation of an ACEI with close follow-up of the creatinine and potassium.  We will try to figure out the pathology of the  "non-cancerous" polyp from 2009.  If it is a tubular adenoma she will require a surveillence colonoscopy in 2014 as planned.  If it is a hyperplastic polyp she will require a screening colonoscopy in 2019.

## 2010-11-18 NOTE — Letter (Signed)
Summary: [facility]   Imported By: Garlan Fillers 07/07/2010 13:33:49  _____________________________________________________________________  External Attachment:    Type:   Image     Comment:   External Document

## 2010-11-18 NOTE — Letter (Signed)
Summary: ONE TOUCH/BLOOD GLUCOSE  ONE TOUCH/BLOOD GLUCOSE   Imported By: Garlan Fillers 06/16/2010 11:10:58  _____________________________________________________________________  External Attachment:    Type:   Image     Comment:   External Document

## 2010-11-18 NOTE — Miscellaneous (Signed)
Summary: PATIENT CONSENT FORM  PATIENT CONSENT FORM   Imported By: Lacy Duverney 05/27/2010 09:31:38  _____________________________________________________________________  External Attachment:    Type:   Image     Comment:   External Document

## 2010-11-18 NOTE — Progress Notes (Signed)
Summary: Refill/gh  Phone Note Refill Request Message from:  Fax from Pharmacy on August 20, 2010 3:34 PM  Refills Requested: Medication #1:  Vit. D 50,000 unit caps 1 capsule by mouth on Tuesday nad Fridays   Last Refilled: 07/18/2010 Last office visit and labs were 06/08/2010.  No mention of Vit D .  No appointments are pending.   Method Requested: Electronic Initial call taken by: Sander Nephew RN,  August 20, 2010 3:44 PM  Follow-up for Phone Call        I'm going to have to refuse this as you pointed out not on med list and never had Vit D level. Follow-up by: Larey Dresser MD,  August 20, 2010 3:57 PM

## 2010-11-18 NOTE — Letter (Signed)
Summary: DIABETIC METER DOWNLOAD 07/25-08/23  DIABETIC METER DOWNLOAD 07/25-08/23   Imported By: Enedina Finner 06/22/2010 11:13:21  _____________________________________________________________________  External Attachment:    Type:   Image     Comment:   External Document

## 2010-11-18 NOTE — Assessment & Plan Note (Signed)
Summary: EST-CK/FU/MEDS/CFB   Vital Signs:  Patient profile:   55 year old female Height:      65 inches (165.10 cm) Weight:      207.8 pounds (94.41 kg) BMI:     34.70 Temp:     98.7 degrees F (37.06 degrees C) oral Pulse rate:   93 / minute BP sitting:   178 / 88  (left arm) Cuff size:   large  Vitals Entered By: Lucky Rathke NT II (June 08, 2010 4:16 PM) CC: PATIENT IS HERE FOR PAIN IN LEFT ARM - PAIN ALL DAY / N/V THIS MORNING X1  /   RIGHT KNEE PAIN X 3 DAYS Is Patient Diabetic? Yes Did you bring your meter with you today? Yes Pain Assessment Patient in pain? yes     Location: LEFT ARM Intensity:               6 Type: sharp Onset of pain  ALL DAY Nutritional Status BMI of > 30 = obese  Have you ever been in a relationship where you felt threatened, hurt or afraid?No   Does patient need assistance? Functional Status Self care Ambulation Normal   Primary Care Provider:  Rikki Spearing, MD  CC:  PATIENT IS HERE FOR PAIN IN LEFT ARM - PAIN ALL DAY / N/V THIS MORNING X1  /   RIGHT KNEE PAIN X 3 DAYS.  History of Present Illness: Patient here for follow-up.  Today complaining of left sided arm tingling just today.  No weakness.  This has happened before - she says this has happened in the past when she has been stressed out; for the past several years.  It sometimes happens when she is working and on the computer or when she is walking. IT will last sevearl hours at a time. It will go away on its own, nothing makes it better.   With this she can have sharp pain in her chest.  Last time was last week.  Pain lasts a few seconds.  IT goes away on it's own.  When she coughs it hurts worse.   No SOB.  She did get nauseous and vomited this morning x1 and she felt better after vomiting.  No fevers.   No dizziness/LOC.     Patient here today for knee pain - for the past 3 days, but better today.  She can still feel pressure at full extension today. She tried a soft knee brace and  she said she thinks this helped.    Patinet has been taking amoxicillin and tolerating this well for pyelonephritis.  No fevers.  No dysuria, no flank pain.    CBGs 89 - 161 at home.       Preventive Screening-Counseling & Management  Alcohol-Tobacco     Alcohol drinks/day: 0     Smoking Status: quit     Year Quit: 28 yrs ago  Caffeine-Diet-Exercise     Does Patient Exercise: no  Current Problems (verified): 1)  Coronary Artery Disease  (ICD-414.00) 2)  Constipation, Chronic  (ICD-564.09) 3)  Leg Pain, Right  (ICD-729.5) 4)  Kidney Failure  (ICD-586) 5)  Anemia of Renal Failure  (ICD-285.21) 6)  Flank Pain  (ICD-789.09) 7)  Hyperlipidemia  (ICD-272.4) 8)  Essential Hypertension  (ICD-401.9) 9)  Diabetes Mellitus  (ICD-250.00)  Current Medications (verified): 1)  Novolog 100 Unit/ml Soln (Insulin Aspart) .Marland Kitchen.. 12u in The Am, 12u At Lunch, and 18u in The Evening 2)  Lantus 100 Unit/ml Soln (  Insulin Glargine) .... 30u in The Evening 3)  Norvasc 10 Mg Tabs (Amlodipine Besylate) .... Take One Tablet By Mouth Daily 4)  Coreg 25 Mg Tabs (Carvedilol) .... Take 1 Tablet By Mouth Twice Daily 5)  Furosemide 40 Mg Tabs (Furosemide) .... Take One Tablet By Mouth in The Morning 6)  Simvastatin 40 Mg Tabs (Simvastatin) .... Take 1 Tablet By Mouth Daily 7)  Procrit 10000 Unit/ml Soln (Epoetin Alfa) .Marland Kitchen.. 10000u Injection Every 2 Weeks 8)  Fish Oil 1000 Mg Caps (Omega-3 Fatty Acids) .... Take 2 Tablets By Mouth Twice Daily 9)  Aspir-Low 81 Mg Tbec (Aspirin) .... Take One Tablet By Mouth Daily 10)  Amoxicillin 250 Mg Caps (Amoxicillin) .... Take 1 Tablet By Mouth Three Times A Day For 14 Days 11)  Doxazosin Mesylate 1 Mg Tabs (Doxazosin Mesylate) .... Take 1 Tablet By Mouth At Bedtime  Allergies (verified): 1)  ! Codeine  Past History:  Past medical, surgical, family and social histories (including risk factors) reviewed for relevance to current acute and chronic problems.  Past Medical  History: Reviewed history from 05/27/2010 and no changes required. DM, type II - diagnosed in 1988; on insulin (HgbA1c March 2011 11.9) HTN hyperlipidemia (March 2011 Chol 225; LDL 148 HDL 49 TG 142) kidney failure - stage III 2/2 DM and HTN (last creatinine in March 2011 was 2.17 and this appears to be around her baseline with eGFR 28) h/o CHF - diagnosed in 2003 during hospitalization, ECHO Sept 2009 showed EF50-55%, impared LV relaxation, mild calcification of mitral valve, mild mitral regurg, mild tricuspid regurg, mildly sclerotic aortic valve with trace aortic regurgitation s/p heart catheterization July 2003 showing "normal coronary arteries" anemia 2/2 renal failure (March 2011 Hgb 10.7) Depression h/o TIA - October 2008 - "twisted face" and slurred speech without extremity weakness h/o retinal bleed on full-dose aspirin h/o MI h/o UTIs OSA - patient does not use her CPAP because it is uncomfortable; Medium full face mask with heated humidification and CPAP 10cm H2O diabetic retinopathy s/p PRP OS last colonoscopy 2009 with polyp removal - pt next due for colonoscopy in 2014  Past Surgical History: Reviewed history from 05/27/2010 and no changes required. TAH/BSO - 1974 - for heavy bleeding/infection; last female exam July 2010 wnl cholescystectomy - 1984 hernia repair - 1985  Family History: Reviewed history from 05/27/2010 and no changes required. mother - cancer (lung, smoker), DM, HTN, CHF (died at 65) father - DM, HTN, CHF (died at 66) both grandmothers - HTN son (49 years old) - DM, CHF, HTN  sister - breast cancer No family history of colon cancer  Social History: Reviewed history from 05/27/2010 and no changes required. Recently unemployed from the health department, and she is actively looking for work.  She lives with husband (monogamous sexual partner) and 2 grandsons.  She has 2 adult children.  Patient is receiving unemployment at the moment; her husband is  unemployed.  Quit smoking cigarettes 28 years ago; smoked 4-5 years  ~1ppd. Alcohol - none Drugs - none  Review of Systems       see HPI  Physical Exam  General:  obese woman in NAD Mouth:  pharynx pink and moist.   Neck:  supple, full ROM, and no masses.   Lungs:  normal breath sounds, no crackles, and no wheezes.   Heart:  normal rate, regular rhythm, and no murmur.   Abdomen:  soft, non-tender, and normal bowel sounds.   Pulses:  2+ bilateral pedal pulses  Extremities:  trace pitting edema bilaterally Psych:  Oriented X3, memory intact for recent and remote, normally interactive, good eye contact, not anxious appearing, and not depressed appearing.     Impression & Recommendations:  Problem # 1:  CORONARY ARTERY DISEASE (ICD-414.00) Assessment Unchanged Patient presents today with atypical CP, normal EKG.  Pt is uninsured.  Last ECHO in 2009 with EF 50-55%.  Will refer to [facility] cardiology given that she is female, diabeteic high cholesterdol with possible non-sig CAD on cath in 2003.  Continue aspirin, statin, beta blocker.  HTN sub-optimal control (see next problem).   The following medications were removed from the medication list:    Lisinopril 10 Mg Tabs (Lisinopril) .Marland Kitchen... Take one tablet by mouth daily Her updated medication list for this problem includes:    Norvasc 10 Mg Tabs (Amlodipine besylate) .Marland Kitchen... Take one tablet by mouth daily    Coreg 25 Mg Tabs (Carvedilol) .Marland Kitchen... Take 1 tablet by mouth twice daily    Furosemide 40 Mg Tabs (Furosemide) .Marland Kitchen... Take one tablet by mouth in the morning    Aspir-low 81 Mg Tbec (Aspirin) .Marland Kitchen... Take one tablet by mouth daily    Doxazosin Mesylate 1 Mg Tabs (Doxazosin mesylate) .Marland Kitchen... Take 1 tablet by mouth at bedtime  Orders: Cardiology Referral (Cardiology)  Problem # 2:  ESSENTIAL HYPERTENSION (ICD-401.9) Assessment: Deteriorated Pt with stage IV kidney disease and h/o hyperkalemia so unforunately we cannot start lisinopril  on her.  Will try alpha blocker doxazosin and titrate up dose as tolerated.  Continue other meds. f/u 10-12 weeks.  The following medications were removed from the medication list:    Lisinopril 10 Mg Tabs (Lisinopril) .Marland Kitchen... Take one tablet by mouth daily Her updated medication list for this problem includes:    Norvasc 10 Mg Tabs (Amlodipine besylate) .Marland Kitchen... Take one tablet by mouth daily    Coreg 25 Mg Tabs (Carvedilol) .Marland Kitchen... Take 1 tablet by mouth twice daily    Furosemide 40 Mg Tabs (Furosemide) .Marland Kitchen... Take one tablet by mouth in the morning    Doxazosin Mesylate 1 Mg Tabs (Doxazosin mesylate) .Marland Kitchen... Take 1 tablet by mouth at bedtime  Orders: T-Basic Metabolic Panel (99991111)  Problem # 3:  DIABETES MELLITUS (ICD-250.00) Assessment: Unchanged last Hgb A1c was 7.8 - pt on insulin.  Will continue current regimen and f/u 10-12 weeks with repeat HgbA1c.  will refer to Butch Penny for education.  The following medications were removed from the medication list:    Lisinopril 10 Mg Tabs (Lisinopril) .Marland Kitchen... Take one tablet by mouth daily Her updated medication list for this problem includes:    Novolog 100 Unit/ml Soln (Insulin aspart) .Marland Kitchen... 12u in the am, 12u at lunch, and 18u in the evening    Lantus 100 Unit/ml Soln (Insulin glargine) .Marland KitchenMarland KitchenMarland KitchenMarland Kitchen 30u in the evening    Aspir-low 81 Mg Tbec (Aspirin) .Marland Kitchen... Take one tablet by mouth daily  Orders: Diabetic Clinic Referral (Diabetic)  Problem # 4:  KIDNEY FAILURE (ICD-586) Assessment: Comment Only PT is stage 4 - followed by renal for this.  Not on dialysis.  Problem # 5:  HYPERLIPIDEMIA (P102836.4) Assessment: Deteriorated FLP grossly elevated.  Will maximize simvastatin dose today to 40mg  and consider starting niacin in future if still not controlled.  Also gave patient counseling on diet and exercise.   Her updated medication list for this problem includes:    Simvastatin 40 Mg Tabs (Simvastatin) .Marland Kitchen... Take 1 tablet by mouth daily  Complete  Medication List: 1)  Novolog 100 Unit/ml Soln (Insulin aspart) .Marland Kitchen.. 12u in the am, 12u at lunch, and 18u in the evening 2)  Lantus 100 Unit/ml Soln (Insulin glargine) .... 30u in the evening 3)  Norvasc 10 Mg Tabs (Amlodipine besylate) .... Take one tablet by mouth daily 4)  Coreg 25 Mg Tabs (Carvedilol) .... Take 1 tablet by mouth twice daily 5)  Furosemide 40 Mg Tabs (Furosemide) .... Take one tablet by mouth in the morning 6)  Simvastatin 40 Mg Tabs (Simvastatin) .... Take 1 tablet by mouth daily 7)  Procrit 10000 Unit/ml Soln (Epoetin alfa) .Marland Kitchen.. 10000u injection every 2 weeks 8)  Fish Oil 1000 Mg Caps (Omega-3 fatty acids) .... Take 2 tablets by mouth twice daily 9)  Aspir-low 81 Mg Tbec (Aspirin) .... Take one tablet by mouth daily 10)  Amoxicillin 250 Mg Caps (Amoxicillin) .... Take 1 tablet by mouth three times a day for 14 days 11)  Doxazosin Mesylate 1 Mg Tabs (Doxazosin mesylate) .... Take 1 tablet by mouth at bedtime  Patient Instructions: 1)  Please finish your antibiotics. 2)  I will refer you to [facility] cardiology. 3)  I have referred you to Butch Penny, our diabetes educator. 4)  You will start a new blood pressure medicine called doxazosin.  Take 1 tablet every night. 5)  Please follow- up with me in 10-12 weeks. Prescriptions: SIMVASTATIN 40 MG TABS (SIMVASTATIN) Take 1 tablet by mouth daily  #30 x 5   Entered and Authorized by:   Rikki Spearing, MD   Signed by:   Rikki Spearing, MD on 06/08/2010   Method used:   Electronically to        [facility] [phone]* (retail)       [address]       [place], [place]  [address]       Ph: [phone]       Fax: [phone]   [id number] DOXAZOSIN MESYLATE 1 MG TABS (DOXAZOSIN MESYLATE) Take 1 tablet by mouth at bedtime  #30 x 5   Entered and Authorized by:   Rikki Spearing, MD   Signed by:   Rikki Spearing, MD on 06/08/2010   Method used:   Electronically to        [facility] [phone]*  (retail)       [address]       [place], [place]  [address]       Ph: [phone]       Fax: [phone]   RxID:   [id number]  Process Orders Check Orders Results:     [facility]: ABN not required for this insurance Tests Sent for requisitioning (June 08, 2010 6:22 PM):     06/08/2010: [facility] -- T-Basic Metabolic Panel 0000000 (signed)     Prevention & Chronic Care Immunizations   Influenza vaccine: Not documented    Tetanus booster: Not documented    Pneumococcal vaccine: Not documented  Colorectal Screening   Hemoccult: Not documented    Colonoscopy: Not documented  Other Screening   Pap smear: Not documented    Mammogram: Not documented   Smoking status: quit  (06/08/2010)  Diabetes Mellitus   HgbA1C: 7.8  (05/27/2010)    Eye exam: Not documented   Diabetic eye exam action/deferral: Ophthalmology referral  (05/27/2010)    Foot exam: yes  (05/27/2010)   High risk foot: Not documented   Foot care education: Not documented    Urine microalbumin/creatinine ratio: 261.1  (05/28/2010)  Urine microalbumin action/deferral: Ordered  Lipids   Total Cholesterol: 286  (05/28/2010)   Lipid panel action/deferral: Lipid Panel ordered   LDL: 208  (05/28/2010)   LDL Direct: Not documented   HDL: 48  (05/28/2010)   Triglycerides: 149  (05/28/2010)    SGOT (AST): 13  (05/28/2010)   BMP action: Ordered   SGPT (ALT): 14  (05/28/2010)   Alkaline phosphatase: 90  (05/28/2010)   Total bilirubin: 0.4  (05/28/2010)  Hypertension   Last Blood Pressure: 178 / 88  (06/08/2010)   Serum creatinine: 2.00  (05/28/2010)   BMP action: Ordered   Serum potassium 5.4  (05/28/2010)  Self-Management Support :   Personal Goals (by the next clinic visit) :     Personal A1C goal: 6  (06/08/2010)     Personal blood pressure goal: 130/80  (06/08/2010)     Personal LDL goal: 100  (06/08/2010)    Patient will work on the following items  until the next clinic visit to reach self-care goals:     Medications and monitoring: take my medicines every day, check my blood sugar, bring all of my medications to every visit, examine my feet every day  (06/08/2010)     Eating: eat more vegetables, use fresh or frozen vegetables, eat foods that are low in salt, eat baked foods instead of fried foods, eat fruit for snacks and desserts, limit or avoid alcohol  (06/08/2010)    Diabetes self-management support: Resources for patients handout  (06/08/2010)   Referred for diabetes self-mgmt training.    Hypertension self-management support: Resources for patients handout  (06/08/2010)    Lipid self-management support: Resources for patients handout  (06/08/2010)        Resource handout printed.   Appended Document: EST-CK/FU/MEDS/CFB I saw and examined Ms Cowles with Dr Shon Baton and agree with her note above. Pt has atypical CP but is a diabetic and a female so may have atypical CP. She is at high risk as Dr Shon Baton outlined. She had a cath with "no sig lesions." We do not have the origina report and itis not available in e chart either. She could have had nonobstructing lesions of the 30%-40% type (this is just speculation not fact). Regardless she is at high risk and I agree with Dr Shon Baton that she needs further eval.

## 2010-11-18 NOTE — Consult Note (Signed)
Summary: [facility]   [facility]   Imported By: Garlan Fillers 06/16/2010 11:08:47  _____________________________________________________________________  External Attachment:    Type:   Image     Comment:   External Document

## 2010-11-18 NOTE — Progress Notes (Signed)
  Phone Note Call from Patient   Caller: Patient Call For: [name] Reason for Call: Referral Details for Reason: TO HAVE APPT CANCELLED [place]. Summary of Call: [facility] CALLED AND ASKED ME TO CANCELL  THIS APPT WITH [facility].THIS APPT WAS SCHULED FOR OCT. 6. 011. AT PATIENT'S REQUEST.

## 2010-12-27 LAB — POCT URINALYSIS DIPSTICK
Ketones, ur: NEGATIVE mg/dL
Protein, ur: >=300 mg/dL — AB
pH: 5.5 (ref 5.0–8.0)

## 2010-12-27 LAB — URINE CULTURE: Colony Count: 70000

## 2011-01-02 LAB — RENAL FUNCTION PANEL
Albumin: 3.7 g/dL (ref 3.5–5.2)
BUN: 69 mg/dL — ABNORMAL HIGH (ref 6–23)
Calcium: 9.6 mg/dL (ref 8.4–10.5)
Creatinine, Ser: 2.85 mg/dL — ABNORMAL HIGH (ref 0.4–1.2)
GFR calc Af Amer: 23 mL/min — ABNORMAL LOW (ref >60)
GFR calc non Af Amer: 19 mL/min — ABNORMAL LOW (ref >60)
Phosphorus: 4.1 mg/dL (ref 2.3–4.6)
Phosphorus: 5.2 mg/dL — ABNORMAL HIGH (ref 2.3–4.6)
Sodium: 130 mEq/L — ABNORMAL LOW (ref 135–145)

## 2011-01-02 LAB — POCT HEMOGLOBIN-HEMACUE: Hemoglobin: 11.2 g/dL — ABNORMAL LOW (ref 12.0–15.0)

## 2011-01-03 LAB — RENAL FUNCTION PANEL
CO2: 24 mEq/L (ref 19–32)
Calcium: 9 mg/dL (ref 8.4–10.5)
Creatinine, Ser: 2.26 mg/dL — ABNORMAL HIGH (ref 0.4–1.2)
Glucose, Bld: 148 mg/dL — ABNORMAL HIGH (ref 70–99)
Sodium: 135 mEq/L (ref 135–145)

## 2011-01-03 LAB — POCT HEMOGLOBIN-HEMACUE: Hemoglobin: 10.9 g/dL — ABNORMAL LOW (ref 12.0–15.0)

## 2011-01-04 LAB — RENAL FUNCTION PANEL
Calcium: 9.2 mg/dL (ref 8.4–10.5)
Creatinine, Ser: 2.6 mg/dL — ABNORMAL HIGH (ref 0.4–1.2)
Glucose, Bld: 363 mg/dL — ABNORMAL HIGH (ref 70–99)
Phosphorus: 4.2 mg/dL (ref 2.3–4.6)
Sodium: 135 mEq/L (ref 135–145)

## 2011-01-04 LAB — IRON AND TIBC
Iron: 67 ug/dL (ref 42–135)
Saturation Ratios: 22 % (ref 20–55)
TIBC: 300 ug/dL (ref 250–470)

## 2011-01-05 LAB — RENAL FUNCTION PANEL
Albumin: 3.3 g/dL — ABNORMAL LOW (ref 3.5–5.2)
CO2: 20 mEq/L (ref 19–32)
Chloride: 99 mEq/L (ref 96–112)
GFR calc Af Amer: 29 mL/min — ABNORMAL LOW (ref >60)
GFR calc non Af Amer: 24 mL/min — ABNORMAL LOW (ref >60)
Potassium: 5.5 mEq/L — ABNORMAL HIGH (ref 3.5–5.1)
Sodium: 127 mEq/L — ABNORMAL LOW (ref 135–145)

## 2011-01-05 LAB — IRON AND TIBC: Iron: 76 ug/dL (ref 42–135)

## 2011-01-05 LAB — POCT HEMOGLOBIN-HEMACUE: Hemoglobin: 10.7 g/dL — ABNORMAL LOW (ref 12.0–15.0)

## 2011-01-07 LAB — BASIC METABOLIC PANEL
BUN: 52 mg/dL — ABNORMAL HIGH (ref 6–23)
BUN: 57 mg/dL — ABNORMAL HIGH (ref 6–23)
Calcium: 8.7 mg/dL (ref 8.4–10.5)
Calcium: 9.1 mg/dL (ref 8.4–10.5)
Creatinine, Ser: 2.59 mg/dL — ABNORMAL HIGH (ref 0.4–1.2)
GFR calc Af Amer: 29 mL/min — ABNORMAL LOW (ref >60)
GFR calc non Af Amer: 19 mL/min — ABNORMAL LOW (ref >60)
GFR calc non Af Amer: 23 mL/min — ABNORMAL LOW (ref >60)
GFR calc non Af Amer: 24 mL/min — ABNORMAL LOW (ref >60)
Glucose, Bld: 175 mg/dL — ABNORMAL HIGH (ref 70–99)
Glucose, Bld: 245 mg/dL — ABNORMAL HIGH (ref 70–99)
Glucose, Bld: 257 mg/dL — ABNORMAL HIGH (ref 70–99)
Sodium: 136 mEq/L (ref 135–145)
Sodium: 140 mEq/L (ref 135–145)

## 2011-01-07 LAB — LIPID PANEL
LDL Cholesterol: 117 mg/dL — ABNORMAL HIGH (ref 0–99)
Total CHOL/HDL Ratio: 4.7 RATIO
VLDL: 16 mg/dL (ref 0–40)

## 2011-01-07 LAB — CBC
HCT: 32 % — ABNORMAL LOW (ref 36.0–46.0)
Hemoglobin: 10.7 g/dL — ABNORMAL LOW (ref 12.0–15.0)
Hemoglobin: 10.8 g/dL — ABNORMAL LOW (ref 12.0–15.0)
MCHC: 33.8 g/dL (ref 30.0–36.0)
Platelets: 355 10*3/uL (ref 150–400)
RDW: 12.9 % (ref 11.5–15.5)
RDW: 13.1 % (ref 11.5–15.5)
RDW: 13.2 % (ref 11.5–15.5)
WBC: 11.5 10*3/uL — ABNORMAL HIGH (ref 4.0–10.5)

## 2011-01-07 LAB — POCT CARDIAC MARKERS
Myoglobin, poc: 374 ng/mL (ref 12–200)
Troponin i, poc: <0.05 ng/mL (ref 0.00–0.09)

## 2011-01-07 LAB — RAPID URINE DRUG SCREEN, HOSP PERFORMED
Amphetamines: NOT DETECTED
Barbiturates: NOT DETECTED
Benzodiazepines: NOT DETECTED
Cocaine: NOT DETECTED

## 2011-01-07 LAB — URINE CULTURE

## 2011-01-07 LAB — URINALYSIS, ROUTINE W REFLEX MICROSCOPIC
Bilirubin Urine: NEGATIVE
Glucose, UA: NEGATIVE mg/dL
Ketones, ur: NEGATIVE mg/dL
Protein, ur: 30 mg/dL — AB
pH: 5 (ref 5.0–8.0)

## 2011-01-07 LAB — BRAIN NATRIURETIC PEPTIDE: Pro B Natriuretic peptide (BNP): <30 pg/mL (ref 0.0–100.0)

## 2011-01-07 LAB — HEMOGLOBIN A1C
Hgb A1c MFr Bld: 14.9 % — ABNORMAL HIGH (ref 4.6–6.1)
Mean Plasma Glucose: 381 mg/dL

## 2011-01-07 LAB — GLUCOSE, CAPILLARY
Glucose-Capillary: 219 mg/dL — ABNORMAL HIGH (ref 70–99)
Glucose-Capillary: 229 mg/dL — ABNORMAL HIGH (ref 70–99)
Glucose-Capillary: 299 mg/dL — ABNORMAL HIGH (ref 70–99)
Glucose-Capillary: 343 mg/dL — ABNORMAL HIGH (ref 70–99)

## 2011-01-07 LAB — DIFFERENTIAL
Basophils Absolute: 0 10*3/uL (ref 0.0–0.1)
Basophils Absolute: 0 10*3/uL (ref 0.0–0.1)
Basophils Relative: 0 % (ref 0–1)
Eosinophils Absolute: 0.2 10*3/uL (ref 0.0–0.7)
Eosinophils Relative: 2 % (ref 0–5)
Lymphocytes Relative: 11 % — ABNORMAL LOW (ref 12–46)
Lymphs Abs: 1.3 10*3/uL (ref 0.7–4.0)
Monocytes Absolute: 0.5 10*3/uL (ref 0.1–1.0)
Neutrophils Relative %: 82 % — ABNORMAL HIGH (ref 43–77)

## 2011-01-07 LAB — URINE MICROSCOPIC-ADD ON

## 2011-01-07 LAB — CARDIAC PANEL(CRET KIN+CKTOT+MB+TROPI)
Relative Index: 1.6 (ref 0.0–2.5)
Total CK: 128 U/L (ref 7–177)
Troponin I: <0.01 ng/mL (ref 0.00–0.06)

## 2011-01-07 LAB — MAGNESIUM: Magnesium: 1.9 mg/dL (ref 1.5–2.5)

## 2011-01-07 LAB — POCT HEMOGLOBIN-HEMACUE: Hemoglobin: 10.7 g/dL — ABNORMAL LOW (ref 12.0–15.0)

## 2011-01-07 LAB — HEPATIC FUNCTION PANEL
AST: 18 U/L (ref 0–37)
Bilirubin, Direct: 0.1 mg/dL (ref 0.0–0.3)
Indirect Bilirubin: 0.6 mg/dL (ref 0.3–0.9)
Total Bilirubin: 0.7 mg/dL (ref 0.3–1.2)

## 2011-01-07 LAB — PREGNANCY, URINE: Preg Test, Ur: NEGATIVE

## 2011-01-07 LAB — RENAL FUNCTION PANEL
BUN: 42 mg/dL — ABNORMAL HIGH (ref 6–23)
Calcium: 9.2 mg/dL (ref 8.4–10.5)
Creatinine, Ser: 2.3 mg/dL — ABNORMAL HIGH (ref 0.4–1.2)
Glucose, Bld: 294 mg/dL — ABNORMAL HIGH (ref 70–99)
Phosphorus: 4.3 mg/dL (ref 2.3–4.6)
Sodium: 135 mEq/L (ref 135–145)

## 2011-01-07 LAB — CK TOTAL AND CKMB (NOT AT ARMC)
CK, MB: 2.1 ng/mL (ref 0.3–4.0)
Relative Index: 1.5 (ref 0.0–2.5)
Total CK: 144 U/L (ref 7–177)

## 2011-01-07 LAB — TSH: TSH: 0.672 u[IU]/mL (ref 0.350–4.500)

## 2011-01-18 LAB — RENAL FUNCTION PANEL
BUN: 54 mg/dL — ABNORMAL HIGH (ref 6–23)
Chloride: 99 mEq/L (ref 96–112)
Glucose, Bld: 343 mg/dL — ABNORMAL HIGH (ref 70–99)
Phosphorus: 4.2 mg/dL (ref 2.3–4.6)
Potassium: 5.4 mEq/L — ABNORMAL HIGH (ref 3.5–5.1)
Sodium: 132 mEq/L — ABNORMAL LOW (ref 135–145)

## 2011-01-19 ENCOUNTER — Other Ambulatory Visit: Payer: Self-pay | Admitting: *Deleted

## 2011-01-19 DIAGNOSIS — E785 Hyperlipidemia, unspecified: Secondary | ICD-10-CM

## 2011-01-19 MED ORDER — SIMVASTATIN 40 MG PO TABS: 40.0000 mg | ORAL_TABLET | Freq: Every day | ORAL | Status: DC

## 2011-01-20 LAB — RENAL FUNCTION PANEL
Albumin: 3.4 g/dL — ABNORMAL LOW (ref 3.5–5.2)
BUN: 56 mg/dL — ABNORMAL HIGH (ref 6–23)
CO2: 26 mEq/L (ref 19–32)
Calcium: 9.1 mg/dL (ref 8.4–10.5)
Chloride: 101 mEq/L (ref 96–112)
Creatinine, Ser: 3.28 mg/dL — ABNORMAL HIGH (ref 0.4–1.2)
GFR calc Af Amer: 18 mL/min — ABNORMAL LOW (ref >60)
GFR calc non Af Amer: 15 mL/min — ABNORMAL LOW (ref >60)
Glucose, Bld: 252 mg/dL — ABNORMAL HIGH (ref 70–99)
Phosphorus: 3.4 mg/dL (ref 2.3–4.6)
Potassium: 4.9 mEq/L (ref 3.5–5.1)
Sodium: 134 mEq/L — ABNORMAL LOW (ref 135–145)

## 2011-01-20 LAB — POCT HEMOGLOBIN-HEMACUE: Hemoglobin: 9.9 g/dL — ABNORMAL LOW (ref 12.0–15.0)

## 2011-01-21 LAB — RENAL FUNCTION PANEL
BUN: 59 mg/dL — ABNORMAL HIGH (ref 6–23)
CO2: 26 mEq/L (ref 19–32)
Chloride: 103 mEq/L (ref 96–112)
Chloride: 101 mEq/L (ref 96–112)
Creatinine, Ser: 2.49 mg/dL — ABNORMAL HIGH (ref 0.4–1.2)
GFR calc Af Amer: 24 mL/min — ABNORMAL LOW (ref >60)
GFR calc non Af Amer: 20 mL/min — ABNORMAL LOW (ref >60)
Glucose, Bld: 301 mg/dL — ABNORMAL HIGH (ref 70–99)
Glucose, Bld: 152 mg/dL — ABNORMAL HIGH (ref 70–99)
Potassium: 5.2 mEq/L — ABNORMAL HIGH (ref 3.5–5.1)
Sodium: 137 mEq/L (ref 135–145)

## 2011-01-21 LAB — IRON AND TIBC
Iron: 75 ug/dL (ref 42–135)
Saturation Ratios: 16 % — ABNORMAL LOW (ref 20–55)
UIBC: 393 ug/dL

## 2011-01-21 LAB — POCT HEMOGLOBIN-HEMACUE: Hemoglobin: 10.2 g/dL — ABNORMAL LOW (ref 12.0–15.0)

## 2011-01-22 LAB — RENAL FUNCTION PANEL
Albumin: 3.3 g/dL — ABNORMAL LOW (ref 3.5–5.2)
Calcium: 9.7 mg/dL (ref 8.4–10.5)
GFR calc Af Amer: 29 mL/min — ABNORMAL LOW (ref >60)
GFR calc non Af Amer: 24 mL/min — ABNORMAL LOW (ref >60)
Glucose, Bld: 206 mg/dL — ABNORMAL HIGH (ref 70–99)
Phosphorus: 3.8 mg/dL (ref 2.3–4.6)
Potassium: 5.8 mEq/L — ABNORMAL HIGH (ref 3.5–5.1)
Sodium: 135 mEq/L (ref 135–145)

## 2011-01-22 LAB — POCT HEMOGLOBIN-HEMACUE: Hemoglobin: 10.7 g/dL — ABNORMAL LOW (ref 12.0–15.0)

## 2011-01-23 LAB — RENAL FUNCTION PANEL
CO2: 25 mEq/L (ref 19–32)
Chloride: 106 mEq/L (ref 96–112)
Creatinine, Ser: 2.11 mg/dL — ABNORMAL HIGH (ref 0.4–1.2)
GFR calc Af Amer: 29 mL/min — ABNORMAL LOW (ref >60)
GFR calc non Af Amer: 24 mL/min — ABNORMAL LOW (ref >60)
Glucose, Bld: 243 mg/dL — ABNORMAL HIGH (ref 70–99)

## 2011-01-23 LAB — IRON AND TIBC: Iron: 63 ug/dL (ref 42–135)

## 2011-01-24 LAB — POCT HEMOGLOBIN-HEMACUE: Hemoglobin: 10.8 g/dL — ABNORMAL LOW (ref 12.0–15.0)

## 2011-01-24 LAB — RENAL FUNCTION PANEL
Albumin: 3.3 g/dL — ABNORMAL LOW (ref 3.5–5.2)
Chloride: 103 mEq/L (ref 96–112)
Glucose, Bld: 468 mg/dL — ABNORMAL HIGH (ref 70–99)
Phosphorus: 3.9 mg/dL (ref 2.3–4.6)
Potassium: 5.2 mEq/L — ABNORMAL HIGH (ref 3.5–5.1)
Sodium: 134 mEq/L — ABNORMAL LOW (ref 135–145)

## 2011-01-25 LAB — RENAL FUNCTION PANEL
Albumin: 3.3 g/dL — ABNORMAL LOW (ref 3.5–5.2)
BUN: 56 mg/dL — ABNORMAL HIGH (ref 6–23)
Creatinine, Ser: 2.6 mg/dL — ABNORMAL HIGH (ref 0.4–1.2)
Phosphorus: 4.4 mg/dL (ref 2.3–4.6)
Potassium: 5.3 mEq/L — ABNORMAL HIGH (ref 3.5–5.1)

## 2011-01-25 LAB — POCT HEMOGLOBIN-HEMACUE: Hemoglobin: 11.2 g/dL — ABNORMAL LOW (ref 12.0–15.0)

## 2011-01-26 LAB — POCT HEMOGLOBIN-HEMACUE: Hemoglobin: 10.5 g/dL — ABNORMAL LOW (ref 12.0–15.0)

## 2011-01-26 LAB — RENAL FUNCTION PANEL
CO2: 25 mEq/L (ref 19–32)
Chloride: 101 mEq/L (ref 96–112)
GFR calc Af Amer: 28 mL/min — ABNORMAL LOW (ref >60)
GFR calc non Af Amer: 23 mL/min — ABNORMAL LOW (ref >60)
Sodium: 132 mEq/L — ABNORMAL LOW (ref 135–145)

## 2011-01-27 LAB — POCT HEMOGLOBIN-HEMACUE: Hemoglobin: 10.6 g/dL — ABNORMAL LOW (ref 12.0–15.0)

## 2011-01-27 LAB — RENAL FUNCTION PANEL
Albumin: 3.2 g/dL — ABNORMAL LOW (ref 3.5–5.2)
Phosphorus: 4.3 mg/dL (ref 2.3–4.6)
Potassium: 5 mEq/L (ref 3.5–5.1)
Sodium: 135 mEq/L (ref 135–145)

## 2011-01-31 LAB — POCT HEMOGLOBIN-HEMACUE: Hemoglobin: 12.1 g/dL (ref 12.0–15.0)

## 2011-02-01 LAB — RENAL FUNCTION PANEL
Albumin: 3.2 g/dL — ABNORMAL LOW (ref 3.5–5.2)
BUN: 44 mg/dL — ABNORMAL HIGH (ref 6–23)
CO2: 26 mEq/L (ref 19–32)
Chloride: 102 mEq/L (ref 96–112)
Creatinine, Ser: 2.08 mg/dL — ABNORMAL HIGH (ref 0.4–1.2)
Potassium: 5.1 mEq/L (ref 3.5–5.1)

## 2011-02-15 ENCOUNTER — Encounter: Payer: Self-pay | Admitting: Internal Medicine

## 2011-03-01 NOTE — H&P (Signed)
[name]                  ACCOUNT NO.:  [id number]   MEDICAL RECORD NO.:  [id number]          [name] TYPE:  INP   LOCATION:  3020                         FACILITY:  [facility]   PHYSICIAN:  Sharlet Salina, M.D.   DATE OF BIRTH:  1956-03-25   DATE OF ADMISSION:  07/21/2007  DATE OF DISCHARGE:                              HISTORY & PHYSICAL   CHIEF COMPLAINT:  Weakness on Danielle left side.   HISTORY OF PRESENT ILLNESS:  [name] is a 55 year old African-  American female that presented to [facility] urgent care with left-sided  weakness.  [name] states that at about 11:00 p.m. last night, she  started feeling funny.  Then about 8:00 a.m. while reading Danielle bible,  both her and her husband noted that her speech was slurred.  She then  noted also that she was weaker on Danielle left side with some left-side  pain.  She has no chest pain, headache, no shortness of breath, but she  does have some headache on Danielle left side.   PAST MEDICAL HISTORY:  1. Significant for CHF.  2. Diabetes.  3. Hypertension.  4. Chronic kidney disease followed by Dr. Marval Regal.  5. Dyslipidemia.  6. Obesity.   FAMILY HISTORY:  Mother died of heart attack at 47.  Father is 59 years  old with a history of diabetes.  Six siblings with a history of stroke,  diabetes, hypertension, and heart disease, and also cancer.   SOCIAL HISTORY:  She is married.  She quit tobacco about 25 years ago.  No alcohol use.  She has two biologic children.   MEDICATIONS:  She takes:  1. Diovan HCTZ 160/12.5 daily.  2. Humalog 75/25, 40 twice daily.  3. Carvedilol 25 b.i.d.   ALLERGIES:  CODEINE.   REVIEW OF SYMPTOMS:  As per stated in Danielle HPI.   PHYSICAL EXAMINATION:  VITAL SIGNS:  Temperature 98.6, blood pressure  169/86, pulse 89, respirations 16, pulse ox 100% on room air.  HEENT:  Danielle head is normocephalic, atraumatic.  Pupils are equal and  reactive to light and accommodation.  Throat without erythema.  CARDIOVASCULAR:  Regular rate and rhythm.  LUNGS:  Clear bilaterally.  No wheezes, rhonchi or rales.  ABDOMEN:  Obese.  Positive bowel sounds.  EXTREMITIES:  Without edema.  There is 2+ DP pulses bilaterally.  NEUROLOGICAL EXAM:  She has some facial asymmetry.  She also is more  weaker on Danielle left both upper and lower extremities, 3-4/5 compared to  Danielle right of 5/5 strength.   LABORATORY DATA:  A head CT that showed no acute stroke.   ASSESSMENT/PLAN:  1. Transient ischemic attack versus conversion disorder.  We will      admit [name] to telemetry.  We will get an MRI and MRA, a two-      dimensional echocardiogram, fasting lipid panel, hemoglobin A1C,      cardiac enzymes, chest x-ray, urinalysis and urine drug screen.      Dr. Leonie Man in neurology has also seen [name].  2. Diabetes.  We will check hemoglobin  A1C.  Start on Lantus sliding-      scale insulin.  3. Hypertension.  We will continue her home medications.  4. Obesity.  We will encourage diet and exercise.  5. Dyslipidemia.  We will check a fasting lipid panel.      Sharlet Salina, M.D.  Electronically Signed     [place]/MEDQ  D:  07/21/2007  T:  07/21/2007  Job:  [id number]   cc:   Theda Belfast. Baird Cancer, M.D.  Pramod P. Leonie Man, MD

## 2011-03-01 NOTE — Discharge Summary (Signed)
NAMEMARRINA, [name]                  ACCOUNT NO.:  [id number]   MEDICAL RECORD NO.:  [id number]          PATIENT TYPE:  INP   LOCATION:  3020                         FACILITY:  [facility]   PHYSICIAN:  Sherryl Manges, M.D.  DATE OF BIRTH:  May 03, 1956   DATE OF ADMISSION:  07/21/2007  DATE OF DISCHARGE:  07/22/2007                               DISCHARGE SUMMARY   PRIMARY MEDICAL DOCTOR:  Dr. Glendale Chard   DISCHARGE DIAGNOSES:  1. Transient left-sided weakness, likely secondary to conversion      symptoms.  2. Type 2 diabetes mellitus.  3. Dyslipidemia.  4. Chronic kidney disease.  5. Hypertension.  6. History of congestive heart failure.  7. Morbid obesity.   DISCHARGE MEDICATIONS:  1. Diovan HCT (12.5/320) one p.o. daily.  2. Carvedilol 25 mg p.o. b.i.d.  3. NovoLog (75/25) 40 units subcutaneously b.i.d.  4. Aspirin 325 mg p.o. daily (over-the-counter).  5. Zocor 20 mg p.o. q.h.s.   PROCEDURES:  1. Head CT scan dated July 21, 2007:  This showed no acute      intracranial abnormality or evidence of acute cortically based      infarct.  2. Brain MRI dated July 21, 2007:  This was normal with no acute      intracranial abnormality.  3. Brain MRA dated July 21, 2007:  This was a normal study.   CONSULTATIONS:  Dr. Antony Contras, neurologist.   ADMISSION HISTORY:  Per history and physical notes of July 21, 2007  dictated by Dr. Horatio Pel.   In brief, this is a 55 year old female, with known history of type 2  diabetes mellitus, morbid obesity, dyslipidemia, chronic kidney disease,  hypertension, previous history of CHF, who presents with complaints of  slurred speech and left-sided weakness/pain commencing at 8:00 a.m.  Code stroke was called.  Patient was reviewed in the emergency  department by Dr. Antony Contras, neurologist.  Head CT scan was  unremarkable for acute pathology.  It was felt that she had possible  TIA, although physical examination did not reveal  focal neurologic  deficits.  She was, however, admitted for further evaluation,  investigation and management.   CLINICAL COURSE:  1. Possible conversion reaction.  For details of presentation, refer      to Admission History above.  Patient at the time of arrival in the      emergency department was evaluated by neurologist; no focal      neurology was seen.  Subsequent brain imaging studies including      head CT scan, brain MRI/MRA showed no evidence of acute pathology.      Patient was placed on neuro checks; she remained asymptomatic      overnight and per neurology opinion, it appears the patient's      symptoms may have been consistent with a conversion reaction.      Patient has been recommended relaxation/stress management.  On the      other hand, she does have risk factors for possible TIA.  She has;      therefore, been placed on low-dose  Aspirin and risk factor      modification has been instituted.   1. Hypertension.  This was suboptimally controlled during the      patient's hospitalization. Blood pressure on July 22, 2007 as a      matter of fact was 160/75.  Her blood pressure medications have      been adjusted upwards accordingly.  We anticipate that patient's      primary MD will continue to monitor her blood pressure and adjust      medications further if indicated.   1. Type 2 diabetes mellitus.  Patient continues on home insulin      regimen.  We shall defer management of her diabetes to her primary      MD who we recommend she sees, within one to two weeks of discharge.   1. Dyslipidemia.  Patient's lipid profile was as follows:  Total      cholesterol 280, triglycerides 174, HDL 52, LDL 215.  Being      diabetic with cardiovascular risk factors, her LDL should ideally      be less than 100.  We have; therefore, commenced patient on a      statin.   1. History of chronic kidney disease.  Patient's BUN was 31,      creatinine 1.56.  She continues to follow  up with her nephrologist,      Dr. Marval Regal.   DISPOSITION:  Patient was considered clinically stable to be discharged  on July 22, 2007.   DIET:  Heart-healthy, carbohydrate modified.   ACTIVITY:  As tolerated.   FOLLOWUP INSTRUCTIONS:  1. Patient is to follow up with her primary MD, Dr. Glendale Chard,      within one to two weeks of discharge.  She has been instructed to      call for an appointment.  2. She is to follow up with her primary nephrologist per scheduled      appointment.      Sherryl Manges, M.D.  Electronically Signed     [place]/MEDQ  D:  07/22/2007  T:  07/22/2007  Job:  [id number]   cc:   Theda Belfast. Baird Cancer, M.D.

## 2011-03-01 NOTE — Consult Note (Signed)
[name], [name]                  ACCOUNT NO.:  [id number]   MEDICAL RECORD NO.:  [id number]          PATIENT TYPE:  INP   LOCATION:  3020                         FACILITY:  [facility]   PHYSICIAN:  Pramod P. Leonie Man, MD    DATE OF BIRTH:  11-Feb-1956   DATE OF CONSULTATION:  DATE OF DISCHARGE:  07/22/2007                                 CONSULTATION   REASON FOR REFERRAL:  Code stroke.   HISTORY OF PRESENT ILLNESS:  [name] is a 55 year old African-American  lady who states she developed sudden onset of slurred speech and left-  sided weakness, at around 10:00 a.m. today.  She actually stated she was  not feeling well since yesterday and had a mild headache which resolved  this morning.  She got up twice at 4:00 and 8:00 and did not feel like  taking her medicines, as she was not feeling well.  At 10:00, she  developed sudden onset of slurred speech.  Family could not stand her.  She was taken to Urgent Care, with the onset of this left-sided  weakness.  Code Stroke was called at 10:33 a.m.  The patient arrived at  11:00 a.m. and was met by me in the hallway, upon arrival.  She has had  improved in her speech, and by the time she was brought back from CT  scan, her left hand strength had also improved.  She had only minimum  left leg drift remaining and NIH score was on lead 2, with 1.4 on leg  drift and 1+ decreased sensation on the left.  She has no known prior  history of stroke, TIA or significant neurological problems.   PAST MEDICAL HISTORY:  Significant for diabetes, hypertension,  hyperlipidemia, obesity, congestive heart failure, anemia.   HOME MEDICATIONS:  Coreg, Diovan, Humalog, Norvasc.   ALLERGIES TO MEDICATIONS:  CODEINE.   FAMILY HISTORY:  Positive for stroke in her younger brother.   SOCIAL HISTORY:  She is lives at home.  She is independent in activities  of daily living.  Does not smoke presently or drink but has a history of  tobacco abuse in the past.   REVIEW OF SYSTEMS:  Negative for chest pain, fever, cough, diarrhea or  illness.   PHYSICAL EXAM:  GENERAL:  Reveals obese middle-aged African-American  lady who is at present not in distress.  VITAL SIGNS:  She is afebrile, temperature 98.6, pulse 89 per minute,  regular sinus, blood pressure 169/86, heart rate 89 per minute.  Respiratory rate 18 per minute, sats 100% on room air.  HEENT:  Head is nontraumatic.  NECK:  Supple.  There is no bruit.  ENT:  Exam unremarkable.  CARDIAC:  Regular heart sounds.  No murmur or gallop.  LUNGS:  Clear to auscultation.  ABDOMEN:  Soft, nontender.  NEUROLOGIC:  The patient is awake, alert.  She is oriented to time,  place and person.  There is no aphasia, apraxia or dysarthria.  Eye  movements are full range.  There is no nystagmus.  Face is symmetric.  Palatal movements are normal.  Tongue is midline.  Motor system exam  reveals no upper extremity drift; however, fine finger movements are  diminished on the left.  She has mild left lower extremity drift.  She  has subjective decreased sensation on the left body.  Deep tendon  reflexes are 1+ symmetric.  Both ankle jerks are absent.  Plantars are  downgoing.   DATA REVIEWED:  Noncontrast CAT scan of the head done today reveals no  acute abnormality.  No hemorrhage or stroke is seen.   LABS:  Are pending at this time.   IMPRESSION:  A 55 year old lady with sudden onset of slurred speech,  left hemiparesis, which seems to be resolving.  She probably has a right  hemispheric TIA versus a small subcortical infarct.  The patient has  presented within 3 hours, but does not qualify for IV thrombolysis due  to rapid resolution of her symptoms.  She will be admitted for stroke  workup to the hospitalist service.  Will check MRI scan of the brain,  MRA, carotid transcranial Doppler studies, 2-D echo and stroke labs.  Start aspirin.  I had long discussion with the patient and her family  members,  regarding symptoms, presentation, treatment plan and answered  questions.           ______________________________  Kathie Rhodes. Leonie Man, MD     PPS/MEDQ  D:  07/21/2007  T:  07/22/2007  Job:  [id number]

## 2011-03-04 NOTE — Procedures (Signed)
[place]. [facility]  Patient:    [name], [name] Visit Number: [id number] MRN: [id number]          Service Type: Attending:  [name], M.D. Dictated by:   [name], M.D. Proc. Date: 01/03/02   CC:         Bryon Lions, M.D.   Procedure Report  DATE OF BIRTH:  October 22, 1955  PROCEDURE PERFORMED:  Esophagogastroduodenoscopy.  ENDOSCOPIST:  [name], M.D.  INSTRUMENT USED:  Olympus video panendoscope.  INDICATIONS FOR PROCEDURE:  Epigastric pain, nausea and vomiting in a 55 year old [place] American female who has had cholecystectomy done in the past, rule out peptic ulcer disease, esophagitis, gastritis, etc.  The patient gives a history of black stool in the recent past.  PREPROCEDURE PREPARATION:  Informed consent was procured from the patient. The patient was fasted for eight hours prior to the procedure.  PREPROCEDURE PHYSICAL:  VITAL SIGNS:  Stable except for a blood pressure of 222/100.  NECK:  Supple.  CHEST:  Clear to auscultation, S1 and S2 regular.  ABDOMEN:  Soft with normal bowel sounds.  DESCRIPTION OF PROCEDURE:  The patient was placed in the left lateral decubitus position, and sedated with 50 mg of Demerol and 5 mg of Versed intravenously.  Once the patient was adequately sedated, maintained on low flow oxygen and continuous cardiac monitoring, the Olympus video panendoscope was advanced through the mouth piece over the tongue into the esophagus under direct vision.  The entire esophagus appeared widely patent and healthy with no lesions.  On advancing the scope in the stomach, the entire gastric mucosa appeared normal.  No erosions, ulcerations, masses, or polyps were seen.  The proximal small bowel appeared somewhat erythematous and the mucosa seemed "shaggy."  Multiple biopsies were done, the exact nature for this is unclear. There was no outlet obstruction.  No ulceration was in the proximal  small bowel.  RECOMMENDATIONS: 1. Continue PPIs for now. 2. Await pathology results. 3. Outpatient follow up in the next two weeks for further recommendations. Dictated by:   [name], M.D. Attending:  [name], M.D. DD:  01/03/02 TD:  01/06/02 Job: [id number]

## 2011-03-04 NOTE — Op Note (Signed)
   [name], [name]                            ACCOUNT NO.:  [id number]   MEDICAL RECORD NO.:  [id number]                   PATIENT TYPE:  AMB   LOCATION:  ENDO                                 FACILITY:  [facility]   PHYSICIAN:  [name], M.D.               DATE OF BIRTH:  Jul 22, 1956   DATE OF PROCEDURE:  02/05/2003  DATE OF DISCHARGE:                                 OPERATIVE REPORT   PROCEDURE PERFORMED:  Colonoscopy with snare polypectomy times one.   ENDOSCOPIST:  Juanita Craver, M.D.   INSTRUMENT USED:  Olympus video colonoscope.   INDICATIONS FOR PROCEDURE:  Iron deficiency anemia in a 55 year old white  female.  Rule out colonic polyps, masses, etc.   PREPROCEDURE PREPARATION:  Informed consent was procured from the patient.  The patient was fasted for eight hours prior to the procedure and prepped  with a bottle of magnesium citrate and a gallon of GoLYTELY the night prior  to the procedure.   PREPROCEDURE PHYSICAL:  The patient had stable vital signs.  Neck supple.  Chest clear to auscultation.  S1 and S2 regular.  Abdomen soft with normal  bowel sounds.   DESCRIPTION OF PROCEDURE:  The patient was placed in left lateral decubitus  position and sedated with an additional 20 mg of Demerol and 2 mg of Versed  intravenously.  Once the patient was adequately sedated and maintained on  low flow oxygen and continuous cardiac monitoring, the Olympus video  colonoscope was advanced from the rectum to the cecum without difficulty.  The appendicular orifice and ileocecal valve were clearly visualized and  photographed.  A small sessile polyp was snared at 30 cm.  Retroflexion in  the rectum revealed no abnormalities.   IMPRESSION:  Normal colonoscopy except for a small sessile polyp snared from  30 cm.                   RECOMMENDATIONS:  1. Await pathology results.  2. Avoid all nonsteroidals including aspirin for the next four weeks  3. Outpatient  follow-up in the next two weeks for further recommendations.                                                   [name], M.D.    JNM/MEDQ  D:  02/05/2003  T:  02/05/2003  Job:  [id number]   cc:   Theda Belfast. Baird Cancer, M.D.  [address]  [place] [address]  Fax: [phone]

## 2011-03-04 NOTE — Op Note (Signed)
[place]. [facility]  Patient:    [name], [name]                         MRN: [id number] Proc. Date: 01/23/01 Adm. Date:  [id number] Attending:  Juanita Craver CC:         Bryon Lions, M.D.                           Operative Report  DATE OF BIRTH:  05-19-1956.  REFERRING PHYSICIAN:  Bryon Lions, M.D.  PROCEDURE PERFORMED:  Colonoscopy.  ENDOSCOPIST:  [name], M.D.  INSTRUMENT USED:  Olympus video colonoscope.  INDICATIONS FOR PROCEDURE:  Blood in stool with severe constipation and recent history of black stools in a 55 year old African-American female.  Rule out colonic polyps, masses, hemorrhoids, etc.  PREPROCEDURE PREPARATION:  Informed consent was procured from the patient. The patient was fasted for eight hours prior to the procedure and prepped with a bottle of magnesium citrate and a gallon of NuLytely the night prior to the procedure.  PREPROCEDURE PHYSICAL:  The patient had stable vital signs.  Neck supple. Chest clear to auscultation.  S1, S2 regular.  Abdomen soft with normal abdominal bowel sounds.  DESCRIPTION OF PROCEDURE:  The patient was placed in the left lateral decubitus position and sedated with 50 mg of Demerol and 4 mg of Versed intravenously.  Once the patient was adequately sedated and maintained on low-flow oxygen and continuous cardiac monitoring, the Olympus video colonoscope was advanced from the rectum to the cecum with difficulty secondary to a large amount of residual stool in the colon.  The patients position was changed from the left lateral to the supine and the right lateral position to facilitate adequate visualization of the entire colonic mucosa. No masses or polyps were seen.  There was no evidence of diverticulosis or hemorrhoids.  However, small lesions could have been missed secondary to a large amount of residual stool in the colon.  The appendicular office and the cecal base were clearly  visualized after changing the patients position multiple times.  IMPRESSION: 1. No abnormality seen. 2. Large amount of residual stool in the colon.  Small lesions may have    been missed.  RECOMMENDATIONS: 1. Repeat guaiac status on outpatient basis and maker further recommendations    thereafter. 2. The patient has been advised to increase the fluid and fiber in the diet.    Avoid all nonsteroidals including aspirin for now.DD:  01/23/01 TD:  01/23/01 Job: [id number]

## 2011-03-04 NOTE — Discharge Summary (Signed)
NAMETRENEICE, [name]                            ACCOUNT NO.:  [id number]   MEDICAL RECORD NO.:  [id number]                   PATIENT TYPE:  INP   LOCATION:  2012                                 FACILITY:  [facility]   PHYSICIAN:  [name], M.D.             DATE OF BIRTH:  06-Jul-1956   DATE OF ADMISSION:  09/28/2003  DATE OF DISCHARGE:  10/01/2003                                 DISCHARGE SUMMARY   DISCHARGE DIAGNOSES:  1. Chest pain on admission, resolved.  2. A little bit of gastrointestinal discomfort with nausea, resolved.  3. Hypertension.  4. Bradycardia with pulse improved since the dose of Coreg was reduced.  5. Diabetes mellitus.  6. Chronic diastolic heart failure.  7. Hypercholesterolemia.  8. Iron deficiency anemia.  9. History of tobacco abuse.   HISTORY OF PRESENT ILLNESS/HOSPITAL COURSE:  This is a 55 year old Caucasian  lady, a patient of Dr. Octavia [name], who presented to the hospital with  complaints of chest pain, onset three days ago.  Her last echocardiogram was  in June of 2003, which revealed normal ejection fraction and mild mitral  regurgitation.  Last Cardiolite, 19 March 2002, showed EF 36%.  By cath in  July 2003, the patient had no significant coronary artery disease and  ejection fraction was 30-35%.   The patient was admitted to the telemetry unit and we cycled cardiac enzymes  and her first set revealed CPK 362, CK-MB 2.2 and troponin 0.02.  Second set  revealed CPK 284, CK-MB 1.6, and troponin 0.02.  Dr. Shelva [name] saw the  patient on admission and since her complaints of nausea and upper GI  discomfort and negative first set of cardiac enzymes, gastrointestinal  consult was ordered.   Hospital consult:  Dr. [name]. Lajoyce [name] saw the patient for Dr. Nelwyn [name]  and impression was that the patient may have gastroesophageal reflux disease  with possible gastroparesis of diabetes and gastric scan was ordered.  This  gastric scan -- the  next day -- was performed at radiology unit and it  showed delayed emptying.  There was very slow emptying at the first hour and  then normal after two hours.  Recommendations were to progress with the diet  as tolerated if the patient is free of nausea and add Reglan to her  medication regimen and see Dr. Collene [name], outpatient, as needed.   The morning of discharge, the patient was free of chest pain and was  assessed by Dr. Eden Lathe. [name] and deemed stable for discharge home.  Her  blood pressure was 138/65, she was afebrile, heart rate between 70 to 80.  Her bradycardia improved after we decreased the dose of Coreg.   HOSPITAL STUDIES:  At time of discharge, her CBC showed white blood cell  count 7.9, hemoglobin 9.1, hematocrit 27.2, platelets 370,000.  Basic  metabolic panel:  Sodium [id number], potassium 4.2,  chloride 106, carbon dioxide  28, glucose 145, BUN 16, creatinine 1.3, calcium 8.7.  Urine studies this  admission revealed normal values with serum iron 89, TIBC of 306 and percent  of saturation 29, ferritin 58.  The patient was discharged home in stable  condition.   DISCHARGE FOLLOWUP:  Discharge followup with Dr. Tami [name] on October 21, 2003  at 11 a.m. in our office.   DISCHARGE ACTIVITY:  Discharge activity as tolerated.   DISCHARGE DIET:  Advance as tolerated.   DISCHARGE MEDICATIONS:  1. Reglan 10 mg t.i.d. before meals.  2. Protonix 40 mg daily.  3. Norvasc 10 mg daily.  4. Diovan 320 mg daily.  5. Lexapro 20 mg daily.  6. Avandia 8 mg daily.  7. Lantus -- resume home dose.  8. Zocor 20 mg daily.  9. Coreg 12.5 mg b.i.d.  10.      Aspirin 81 mg daily.      [name], P.A.                    [name], M.D.    MK/MEDQ  D:  10/01/2003  T:  10/01/2003  Job:  [id number]   cc:   [facility]

## 2011-03-04 NOTE — Op Note (Signed)
   NAMESHAQUASHA, [name]                            ACCOUNT NO.:  [id number]   MEDICAL RECORD NO.:  [id number]                   PATIENT TYPE:  AMB   LOCATION:  ENDO                                 FACILITY:  [facility]   PHYSICIAN:  [name], M.D.               DATE OF BIRTH:  1956/01/27   DATE OF PROCEDURE:  02/05/2003  DATE OF DISCHARGE:                                 OPERATIVE REPORT   PROCEDURE PERFORMED:  Esophagogastroduodenoscopy.   ENDOSCOPIST:  Juanita Craver, M.D.   INSTRUMENT USED:  Olympus video panendoscope.   INDICATIONS FOR PROCEDURE:  Iron deficiency anemia in a 55 year old African-  American female, rule out peptic ulcer disease, esophagitis, gastritis, etc.   PREPROCEDURE PREPARATION:  Informed consent was procured from the patient.  The patient was fasted for eight hours prior to the procedure.   PREPROCEDURE PHYSICAL:  The patient had stable vital signs.  Neck supple,  chest clear to auscultation.  S1, S2 regular.  Abdomen soft with normal  bowel sounds.   DESCRIPTION OF PROCEDURE:  The patient was placed in the left lateral  decubitus position and sedated with 50 mg of Demerol and 5 mg of Versed  intravenously.  Once the patient was adequately sedated and maintained on  low-flow oxygen and continuous cardiac monitoring, the Olympus video  panendoscope was advanced through the mouth piece over the tongue into the  esophagus under direct vision.  The entire esophagus appeared normal with no  evidence of ring, stricture, masses, esophagitis or Barrett's mucosa.  The  scope was then advanced to the stomach.  The entire gastric mucosa and the  proximal small bowel appeared normal.   IMPRESSION:  Normal esophagogastroduodenoscopy.   RECOMMENDATIONS:  Proceed with colonoscopy at this time.  Further  recommendations will be made after colonoscopy had been done.                                                [name], M.D.    JNM/MEDQ  D:  02/05/2003   T:  02/05/2003  Job:  [id number]   cc:   Theda Belfast. Baird Cancer, M.D.  [address]  [address] [place] [address]  Fax: [phone]

## 2011-03-04 NOTE — Discharge Summary (Signed)
[place]. [facility]  Patient:    [name], [name] Visit Number: [id number] MRN: [id number]          Service Type: MED Location: [id number] Attending Physician:  Maximino Greenland Dictated by:   Bryon Lions, M.D. Admit Date:  03/18/2002 Discharge Date: 03/21/2002                             Discharge Summary  DATE OF BIRTH: 02-Oct-1971  CONSULTATIONS: [facility], Dr. Einar Gip.  DISCHARGE DIAGNOSES: 1. Chest pain. 2. Hypertensive heart disease with congestive heart failure. 3. Pneumonia. 4. Chronic diastolic heart failure. 5. Congestive heart failure. 6. Hypercholesterolemia. 7. Hypopotassemia. 8. Iron deficiency anemia. 9. Type II diabetes. 10.History of tobacco abuse.  HISTORY OF PRESENT ILLNESS: Ms. Damiano is a 55 year old African-American female who presented to the office complaining of shortness of breath. She was evaluated at [facility] on Friday, Mar 15, 2002 and diagnosed with pneumonia and cardiomegaly. She was prescribed Levaquin 500 mg times ten days and Lasix 40 mg daily. She has had minimal improvement of her symptoms over the weekend. She complains of substernal chest pain last week but her husband told her not to worry about it. He thought it was just gas. Her pain has persisted without radiation. There is associated shortness of breath. No diaphoresis. Occasional nausea.  PAST MEDICAL HISTORY: 1. Type II diabetes, uncontrolled, secondary to noncompliance. 2. Hypertension. 3. Hypercholesterolemia.  HOSPITAL COURSE: The patient was admitted to telemetry to rule out MI. Cardiology was consulted as well for further evaluation and management. They agreed with serial enzymes to rule out MI. She was started on IV Heparin and IV Nitro. They felt that an echo would be helpful to evaluate LV systolic and diastolic function. A 2-D echo was significant for overall left ventricular systolic function at lower  limits of normal with EF ranging from 50-55%, mildly increased left ventricular wall thickness, mild mitral regurgitation and mildly dilated left atrium. A BNP was ordered which was 543 which is elevated and significant for congestive heart failure. The Lasix was continued as well as the ace inhibitor to control the congestive heart failure. However, because of her upper respiratory disease, the ace was discontinued and she was started on Diovan. Diabetes had improved control with Glucovance and sliding scale insulin. She was scheduled for outpatient nutrition evaluation at the [facility]. The patients hospitalization was without complications.  DISPOSITION: She was discharged in stable condition on March 21, 2002.  DISCHARGE MEDICATIONS: 1. Glucophage 500 mg daily in the morning. 2. Actos 45 mg daily. 3. Norvasc 5 mg daily. 4. Aspirin 325 mg daily. 5. Zocor 20 mg at bedtime. 6. Lasix 20 mg b.i.d. 7. KCL 40 meq daily. 8. Diovan 80 mg daily. 9. To complete Levaquin 500 mg for three days.  DIET: Diabetic diet.  SPECIAL INSTRUCTIONS: The patient was scheduled for a Persantine-Cardiolite on April 15, 2002 at [facility].  FOLLOW-UP: She was to follow-up with Dr. Baird Cancer on Thursday, March 27, 2002 at 10:00 a.m. and with Dr. [name] at 9:00 a.m. at [facility] on April 09, 2002.  DISCHARGE CONDITION: She was discharged to home in stable and improved condition. Dictated by:   Bryon Lions, M.D. Attending Physician:  Maximino Greenland DD:  04/22/02 TD:  04/23/02 Job: [id number]

## 2011-05-07 ENCOUNTER — Encounter: Payer: Self-pay | Admitting: Internal Medicine

## 2011-07-06 LAB — CBC
MCHC: 34.1
RBC: 4.19
RDW: 13.1

## 2011-07-08 LAB — CBC
Hemoglobin: 11.4 — ABNORMAL LOW
MCV: 81.3
RBC: 4.2
WBC: 11.4 — ABNORMAL HIGH

## 2011-07-08 LAB — IRON AND TIBC
Iron: 65
TIBC: 259
UIBC: 194

## 2011-07-08 LAB — FERRITIN: Ferritin: 259 (ref 10–291)

## 2011-07-11 LAB — POCT HEMOGLOBIN-HEMACUE: Operator id: 181601

## 2011-07-12 LAB — FERRITIN: Ferritin: 294 — ABNORMAL HIGH (ref 10–291)

## 2011-07-12 LAB — HEMOGLOBIN AND HEMATOCRIT, BLOOD
HCT: 35.4 — ABNORMAL LOW
Hemoglobin: 11.7 — ABNORMAL LOW

## 2011-07-13 LAB — RENAL FUNCTION PANEL
CO2: 24
Calcium: 9.6
Chloride: 103
GFR calc Af Amer: 36 — ABNORMAL LOW
GFR calc non Af Amer: 30 — ABNORMAL LOW
Potassium: 4.7
Sodium: 136

## 2011-07-13 LAB — CBC
HCT: 36
Hemoglobin: 12
Platelets: 401 — ABNORMAL HIGH
RBC: 4.28
RBC: 4.51
WBC: 11.4 — ABNORMAL HIGH
WBC: 15.3 — ABNORMAL HIGH

## 2011-07-13 LAB — URINALYSIS, ROUTINE W REFLEX MICROSCOPIC
Glucose, UA: NEGATIVE
Leukocytes, UA: NEGATIVE
Protein, ur: 100 — AB
Urobilinogen, UA: 0.2

## 2011-07-13 LAB — DIFFERENTIAL
Eosinophils Relative: 2
Lymphocytes Relative: 10 — ABNORMAL LOW
Lymphs Abs: 1.6
Monocytes Relative: 5

## 2011-07-13 LAB — POCT CARDIAC MARKERS
Operator id: 234501
Troponin i, poc: <0.05

## 2011-07-13 LAB — POCT I-STAT, CHEM 8
BUN: 45 — ABNORMAL HIGH
Hemoglobin: 12.9
Sodium: 137
TCO2: 24

## 2011-07-13 LAB — URINE MICROSCOPIC-ADD ON

## 2011-07-14 LAB — CBC
Hemoglobin: 11.6 — ABNORMAL LOW
MCHC: 34.6
MCV: 80.1
RBC: 4.17
WBC: 10.8 — ABNORMAL HIGH

## 2011-07-14 LAB — IRON AND TIBC
Iron: 60
TIBC: 273
UIBC: 213

## 2011-07-14 LAB — RENAL FUNCTION PANEL
Albumin: 3.2 — ABNORMAL LOW
BUN: 36 — ABNORMAL HIGH
BUN: 38 — ABNORMAL HIGH
CO2: 25
Calcium: 9
Chloride: 101
Chloride: 102
Creatinine, Ser: 1.86 — ABNORMAL HIGH
Creatinine, Ser: 1.99 — ABNORMAL HIGH
GFR calc Af Amer: 34 — ABNORMAL LOW
GFR calc non Af Amer: 28 — ABNORMAL LOW
Glucose, Bld: 346 — ABNORMAL HIGH
Phosphorus: 4.5

## 2011-07-14 LAB — FERRITIN: Ferritin: 221 (ref 10–291)

## 2011-07-15 LAB — IRON AND TIBC
Saturation Ratios: 24
UIBC: 201

## 2011-07-15 LAB — RENAL FUNCTION PANEL
Albumin: 3 — ABNORMAL LOW
BUN: 36 — ABNORMAL HIGH
Calcium: 9.2
Chloride: 105
Creatinine, Ser: 1.65 — ABNORMAL HIGH
GFR calc Af Amer: 39 — ABNORMAL LOW

## 2011-07-15 LAB — FERRITIN: Ferritin: 164 (ref 10–291)

## 2011-07-15 LAB — HEMOGLOBIN AND HEMATOCRIT, BLOOD
HCT: 38.2
Hemoglobin: 12.8

## 2011-07-19 LAB — RENAL FUNCTION PANEL
Albumin: 2.9 g/dL — ABNORMAL LOW (ref 3.5–5.2)
BUN: 26 mg/dL — ABNORMAL HIGH (ref 6–23)
CO2: 27 mEq/L (ref 19–32)
Chloride: 101 mEq/L (ref 96–112)
GFR calc Af Amer: 34 mL/min — ABNORMAL LOW (ref >60)
GFR calc non Af Amer: 28 mL/min — ABNORMAL LOW (ref >60)
Potassium: 4.4 mEq/L (ref 3.5–5.1)

## 2011-07-19 LAB — IRON AND TIBC
Iron: 61 ug/dL (ref 42–135)
TIBC: 234 ug/dL — ABNORMAL LOW (ref 250–470)
UIBC: 193 ug/dL

## 2011-07-19 LAB — POCT HEMOGLOBIN-HEMACUE
Hemoglobin: 10.9 g/dL — ABNORMAL LOW (ref 12.0–15.0)
Hemoglobin: 10.4 g/dL — ABNORMAL LOW (ref 12.0–15.0)

## 2011-07-19 LAB — FERRITIN: Ferritin: 282 ng/mL (ref 10–291)

## 2011-07-20 LAB — POCT CARDIAC MARKERS
Myoglobin, poc: >500
Troponin i, poc: <0.05

## 2011-07-20 LAB — CBC
Platelets: 383
WBC: 15.8 — ABNORMAL HIGH

## 2011-07-20 LAB — POCT I-STAT, CHEM 8
Chloride: 105
HCT: 40
Potassium: 4.1

## 2011-07-20 LAB — DIFFERENTIAL
Lymphocytes Relative: 8 — ABNORMAL LOW
Lymphs Abs: 1.2
Neutro Abs: 13.8 — ABNORMAL HIGH
Neutrophils Relative %: 87 — ABNORMAL HIGH

## 2011-07-20 LAB — CK: Total CK: 386 — ABNORMAL HIGH

## 2011-07-21 LAB — RENAL FUNCTION PANEL
Albumin: 3.1 g/dL — ABNORMAL LOW (ref 3.5–5.2)
BUN: 55 mg/dL — ABNORMAL HIGH (ref 6–23)
Calcium: 9 mg/dL (ref 8.4–10.5)
Glucose, Bld: 390 mg/dL — ABNORMAL HIGH (ref 70–99)
Phosphorus: 4.4 mg/dL (ref 2.3–4.6)

## 2011-07-21 LAB — IRON AND TIBC
Iron: 40 ug/dL — ABNORMAL LOW (ref 42–135)
Saturation Ratios: 15 % — ABNORMAL LOW (ref 20–55)
UIBC: 221 ug/dL

## 2011-07-21 LAB — FERRITIN: Ferritin: 288 ng/mL (ref 10–291)

## 2011-07-25 LAB — CBC
HCT: 35.3 — ABNORMAL LOW
MCHC: 33.3
MCV: 81.7
Platelets: 360
RDW: 13.3

## 2011-07-26 LAB — CBC
HCT: 31.8 — ABNORMAL LOW
Hemoglobin: 10.6 — ABNORMAL LOW
MCHC: 33.5
MCV: 81.7
RBC: 3.89

## 2011-07-28 LAB — POCT I-STAT 3, ART BLOOD GAS (G3+)
Acid-base deficit: 2
Operator id: 222511
pCO2 arterial: 42.3
pO2, Arterial: 133 — ABNORMAL HIGH

## 2011-07-28 LAB — COMPREHENSIVE METABOLIC PANEL
Albumin: 3 — ABNORMAL LOW
Albumin: 3.2 — ABNORMAL LOW
Alkaline Phosphatase: 134 — ABNORMAL HIGH
Alkaline Phosphatase: 147 — ABNORMAL HIGH
BUN: 31 — ABNORMAL HIGH
BUN: 34 — ABNORMAL HIGH
Calcium: 8.9
Creatinine, Ser: 1.56 — ABNORMAL HIGH
Creatinine, Ser: 1.66 — ABNORMAL HIGH
Potassium: 4.4
Potassium: 4.5
Total Protein: 7.2
Total Protein: 7.7

## 2011-07-28 LAB — CBC
HCT: 34.6 — ABNORMAL LOW
Hemoglobin: 11.1 — ABNORMAL LOW
Hemoglobin: 11.4 — ABNORMAL LOW
Hemoglobin: 11.6 — ABNORMAL LOW
MCHC: 33
RDW: 12.9
RDW: 12.9
RDW: 13.3
WBC: 11.3 — ABNORMAL HIGH
WBC: 9.8

## 2011-07-28 LAB — IRON AND TIBC
Saturation Ratios: 29
TIBC: 279

## 2011-07-28 LAB — CK TOTAL AND CKMB (NOT AT ARMC)
CK, MB: 3
Relative Index: 1.5
Relative Index: 1.6

## 2011-07-28 LAB — URINALYSIS, ROUTINE W REFLEX MICROSCOPIC
Nitrite: NEGATIVE
Specific Gravity, Urine: 1.014
Urobilinogen, UA: 0.2

## 2011-07-28 LAB — DIFFERENTIAL
Lymphocytes Relative: 18
Monocytes Absolute: 0.6
Monocytes Relative: 6
Neutro Abs: 7.1

## 2011-07-28 LAB — URINE MICROSCOPIC-ADD ON

## 2011-07-28 LAB — RAPID URINE DRUG SCREEN, HOSP PERFORMED
Amphetamines: NOT DETECTED
Opiates: NOT DETECTED
Tetrahydrocannabinol: NOT DETECTED

## 2011-07-28 LAB — APTT: aPTT: 32

## 2011-07-28 LAB — TROPONIN I
Troponin I: 0.01
Troponin I: 0.01

## 2011-07-28 LAB — LIPID PANEL
Triglycerides: 174 — ABNORMAL HIGH
VLDL: 35

## 2011-07-28 LAB — PROTIME-INR
INR: 1
Prothrombin Time: 13.1

## 2011-07-28 LAB — HOMOCYSTEINE: Homocysteine: 11.5

## 2011-07-29 LAB — CBC
MCHC: 33.7
MCV: 81.1
Platelets: 381
WBC: 12.2 — ABNORMAL HIGH

## 2011-08-01 LAB — IRON AND TIBC
Iron: 71
Saturation Ratios: 30
UIBC: 167

## 2011-08-01 LAB — CBC
HCT: 31.3 — ABNORMAL LOW
Platelets: 384
WBC: 10.3

## 2011-08-01 LAB — FERRITIN: Ferritin: 330 — ABNORMAL HIGH (ref 10–291)

## 2011-08-02 LAB — CBC
MCHC: 33.5
RDW: 12.8

## 2011-08-04 LAB — CBC
RBC: 4.2
WBC: 8.8

## 2011-08-04 LAB — FERRITIN: Ferritin: 336 — ABNORMAL HIGH (ref 10–291)

## 2011-08-04 LAB — IRON AND TIBC
Iron: 83
TIBC: 306
UIBC: 223

## 2011-11-08 ENCOUNTER — Encounter: Payer: Self-pay | Admitting: Internal Medicine

## 2011-11-23 ENCOUNTER — Encounter: Payer: Self-pay | Admitting: Vascular Surgery

## 2011-11-24 ENCOUNTER — Ambulatory Visit (INDEPENDENT_AMBULATORY_CARE_PROVIDER_SITE_OTHER): Payer: Managed Care, Other (non HMO) | Admitting: Vascular Surgery

## 2011-11-24 ENCOUNTER — Encounter: Payer: Self-pay | Admitting: Vascular Surgery

## 2011-11-24 ENCOUNTER — Encounter (INDEPENDENT_AMBULATORY_CARE_PROVIDER_SITE_OTHER): Payer: Managed Care, Other (non HMO) | Admitting: *Deleted

## 2011-11-24 VITALS — BP 162/79 | HR 84 | Resp 16 | Ht 64.0 in | Wt 211.0 lb

## 2011-11-24 DIAGNOSIS — N186 End stage renal disease: Secondary | ICD-10-CM

## 2011-11-24 DIAGNOSIS — N184 Chronic kidney disease, stage 4 (severe): Secondary | ICD-10-CM

## 2011-11-24 NOTE — Progress Notes (Signed)
[facility] [place] HISTORY AND PHYSICAL   History of Present Illness:  Patient is a 56 y.o. year old female who presents for placement of a permanent hemodialysis access. The patient is left handed .  The patient is not currently on hemodialysis.  The cause of renal failure is thought to be secondary to diabetes and hypertension.  Other chronic medical problems include obesity, hyperlipidemia, CHF, anemia, sleep apnea. All of these are currently controlled.  Past Medical History  Diagnosis Date  . Hypertension   . Hyperlipidemia   . Diabetes mellitus     diagnosed in 1988, on insulin.  . Chronic kidney disease     stage 3, 2/2 DM and HTN.  . CHF (congestive heart failure)     diagnosed in 2003 during hospitalization , echo in 09/09- EF 50-55%, impaired LV relaxation, mild calcification of mitral valve, mild mitral regurg, mild tric regur, mild sclerortic  aortic valve with trace aortic reg.  . Anemia     2/2 renal failure  . Obstructive sleep apnea     does nt use CPAP as it is uncomfortable, medium full face mask with heated humidification and CPAP 10 cm  H2O  . Diabetic retinopathy     s/p PRP OS  . History of UTI   . Depression   . History of TIAs     10/08- twisted face and slurred speech without extremity weakness  . History of MI (myocardial infarction)   . Bleeding     History of retinal bleed on full dose aspirin  . Post PTCA   . S/P left heart catheterization by cutdown     July 2003, normal coronary arteries.    Past Surgical History  Procedure Date  . Hernia repair   . Cholecystectomy   . Abdominal hysterectomy      Social History History  Substance Use Topics  . Smoking status: Former Smoker -- 1.0 packs/day for 4 years    Types: Cigarettes  . Smokeless tobacco: Not on file  . Alcohol Use: No    Family History Family History  Problem Relation Age of Onset  . Cancer Mother     lung smoker  . Diabetes Mother   . Hypertension  Mother   . Heart disease Mother     CHF, died at 24  . Hyperlipidemia Mother   . Heart attack Mother   . Diabetes Father   . Hypertension Father   . Heart disease Father     CHF, died at 29  . Hyperlipidemia Father   . Heart attack Father   . Cancer Sister     breast cancer  . Diabetes Son   . Heart disease Son   . Hypertension Son   . Hypertension Maternal Grandmother   . Hypertension Paternal Grandmother   . Colon cancer Neg Hx     Allergies  Allergies  Allergen Reactions  . Codeine     REACTION: nausea, vomiting     Current Outpatient Prescriptions  Medication Sig Dispense Refill  . amLODipine (NORVASC) 10 MG tablet Take 10 mg by mouth daily.        Marland Kitchen aspirin 81 MG tablet Take 81 mg by mouth daily.        . carvedilol (COREG) 25 MG tablet Take 25 mg by mouth 2 (two) times daily with a meal.        . doxazosin (CARDURA) 1 MG tablet Take 1 mg by mouth at bedtime.        Marland Kitchen  epoetin alfa (PROCRIT) 16109 UNIT/ML injection Inject 10,000 Units into the skin. Every 2 weeks.       . fish oil-omega-3 fatty acids 1000 MG capsule Take 2 g by mouth daily.        . furosemide (LASIX) 40 MG tablet Take 40 mg by mouth daily.        . insulin aspart (NOVOLOG) 100 UNIT/ML injection Inject into the skin. 12 units in AM, 12 units at lunch and 18 units in the evening.       . insulin glargine (LANTUS) 100 UNIT/ML injection Inject 30 Units into the skin at bedtime.        . simvastatin (ZOCOR) 40 MG tablet Take 1 tablet (40 mg total) by mouth daily.  90 tablet  3    ROS:   General:  No weight loss, Fever, chills  HEENT: No recent headaches, no nasal bleeding, no visual changes, no sore throat  Neurologic: No dizziness, blackouts, seizures. No recent symptoms of stroke or mini- stroke. No recent episodes of slurred speech, or temporary blindness.  Cardiac: No recent episodes of chest pain/pressure, no shortness of breath at rest.  No shortness of breath with exertion.  Denies history  of atrial fibrillation or irregular heartbeat  Vascular: No history of rest pain in feet.  No history of claudication.  No history of non-healing ulcer, No history of DVT   Pulmonary: No home oxygen, no productive cough, no hemoptysis,  No asthma or wheezing  Musculoskeletal:  [ ]  Arthritis, [ ]  Low back pain,  [ ]  Joint pain  Hematologic:No history of hypercoagulable state.  No history of easy bleeding.  No history of anemia  Gastrointestinal: No hematochezia or melena,  No gastroesophageal reflux, no trouble swallowing  Urinary: [ ]  chronic Kidney disease, [ ]  on HD - [ ]  MWF or [ ]  TTHS, [ ]  Burning with urination, [ ]  Frequent urination, [ ]  Difficulty urinating;   Skin: No rashes  Psychological: No history of anxiety,  No history of depression   Physical Examination  Filed Vitals:   11/24/11 1437  BP: 162/79  Pulse: 84  Resp: 16  Height: 5\' 4"  (1.626 m)  Weight: 211 lb (95.709 kg)  SpO2: 99%    Body mass index is 36.22 kg/(m^2).  General:  Alert and oriented, no acute distress HEENT: Normal Neck: No bruit or JVD Pulmonary: Clear to auscultation bilaterally Cardiac: Regular Rate and Rhythm without murmur Gastrointestinal: Soft, non-tender, non-distended, no mass, obese Skin: No rash Extremity Pulses:  2+ radial, brachial pulses bilaterally, cephalic vein palpable from the wrist up to the forearm on the right side on placement of a tourniquet. The vein is slightly lateral to his usual anatomic position but easily palpable. Musculoskeletal: No deformity or edema  Neurologic: Upper and lower extremity motor 5/5 and symmetric  DATA:   Vein mapping ultrasound was performed today which I reviewed and interpreted. This shows the cephalic vein in the forearm is between 40 and 60 mm in diameter. It is difficult to visualize in the upper arm. The cephalic vein in the forearm a less than is also between 40 and 50 mm in diameter. The basilic vein was also greater than 3 mm in  diameter throughout its course bilaterally.   ASSESSMENT:   Needs hemodialysis access. Avoid the best option for her initially with be a right radiocephalic AV fistula. Risks benefits possible complications procedure details including but limited to bleeding infection non-maturation of the fistula ischemic steal  were explained to the patient today she understands and agrees to proceed. Scheduled for Monday, 12/05/2011    Ruta Hinds, MD [facility] [place] Office: [phone] Pager: [phone]

## 2011-11-30 ENCOUNTER — Other Ambulatory Visit: Payer: Self-pay

## 2011-12-01 ENCOUNTER — Other Ambulatory Visit: Payer: Self-pay

## 2011-12-01 ENCOUNTER — Encounter (HOSPITAL_COMMUNITY): Payer: Self-pay | Admitting: Pharmacy Technician

## 2011-12-02 ENCOUNTER — Emergency Department (HOSPITAL_COMMUNITY): Payer: No Typology Code available for payment source

## 2011-12-02 ENCOUNTER — Encounter (HOSPITAL_COMMUNITY): Payer: Self-pay

## 2011-12-02 ENCOUNTER — Other Ambulatory Visit: Payer: Self-pay

## 2011-12-02 ENCOUNTER — Emergency Department (HOSPITAL_COMMUNITY)
Admission: EM | Admit: 2011-12-02 | Discharge: 2011-12-02 | Disposition: A | Discharge status: Home / Self Care | Payer: No Typology Code available for payment source | Attending: Emergency Medicine | Admitting: Emergency Medicine

## 2011-12-02 ENCOUNTER — Other Ambulatory Visit (HOSPITAL_COMMUNITY): Payer: Self-pay | Admitting: *Deleted

## 2011-12-02 ENCOUNTER — Encounter (HOSPITAL_COMMUNITY)
Admission: RE | Admit: 2011-12-02 | Discharge: 2011-12-02 | Disposition: A | Discharge status: Home / Self Care | Payer: Managed Care, Other (non HMO) | Source: Ambulatory Visit | Attending: Anesthesiology | Admitting: Anesthesiology

## 2011-12-02 ENCOUNTER — Encounter (HOSPITAL_COMMUNITY): Payer: Self-pay | Admitting: Vascular Surgery

## 2011-12-02 ENCOUNTER — Encounter (HOSPITAL_COMMUNITY)
Admission: RE | Admit: 2011-12-02 | Discharge: 2011-12-02 | Disposition: A | Discharge status: Home / Self Care | Payer: Managed Care, Other (non HMO) | Source: Ambulatory Visit | Attending: Vascular Surgery | Admitting: Vascular Surgery

## 2011-12-02 DIAGNOSIS — E119 Type 2 diabetes mellitus without complications: Secondary | ICD-10-CM | POA: Insufficient documentation

## 2011-12-02 DIAGNOSIS — E785 Hyperlipidemia, unspecified: Secondary | ICD-10-CM | POA: Insufficient documentation

## 2011-12-02 DIAGNOSIS — Z794 Long term (current) use of insulin: Secondary | ICD-10-CM | POA: Insufficient documentation

## 2011-12-02 DIAGNOSIS — F3289 Other specified depressive episodes: Secondary | ICD-10-CM | POA: Insufficient documentation

## 2011-12-02 DIAGNOSIS — I252 Old myocardial infarction: Secondary | ICD-10-CM | POA: Insufficient documentation

## 2011-12-02 DIAGNOSIS — M542 Cervicalgia: Secondary | ICD-10-CM

## 2011-12-02 DIAGNOSIS — Z8673 Personal history of transient ischemic attack (TIA), and cerebral infarction without residual deficits: Secondary | ICD-10-CM | POA: Insufficient documentation

## 2011-12-02 DIAGNOSIS — F329 Major depressive disorder, single episode, unspecified: Secondary | ICD-10-CM | POA: Insufficient documentation

## 2011-12-02 DIAGNOSIS — I509 Heart failure, unspecified: Secondary | ICD-10-CM | POA: Insufficient documentation

## 2011-12-02 DIAGNOSIS — R110 Nausea: Secondary | ICD-10-CM | POA: Insufficient documentation

## 2011-12-02 DIAGNOSIS — I120 Hypertensive chronic kidney disease with stage 5 chronic kidney disease or end stage renal disease: Secondary | ICD-10-CM | POA: Insufficient documentation

## 2011-12-02 DIAGNOSIS — Z79899 Other long term (current) drug therapy: Secondary | ICD-10-CM | POA: Insufficient documentation

## 2011-12-02 DIAGNOSIS — Z7982 Long term (current) use of aspirin: Secondary | ICD-10-CM | POA: Insufficient documentation

## 2011-12-02 DIAGNOSIS — G4733 Obstructive sleep apnea (adult) (pediatric): Secondary | ICD-10-CM | POA: Insufficient documentation

## 2011-12-02 DIAGNOSIS — N186 End stage renal disease: Secondary | ICD-10-CM | POA: Insufficient documentation

## 2011-12-02 DIAGNOSIS — M79609 Pain in unspecified limb: Secondary | ICD-10-CM | POA: Insufficient documentation

## 2011-12-02 DIAGNOSIS — M129 Arthropathy, unspecified: Secondary | ICD-10-CM | POA: Insufficient documentation

## 2011-12-02 HISTORY — DX: Unspecified osteoarthritis, unspecified site: M19.90

## 2011-12-02 HISTORY — DX: Acute myocardial infarction, unspecified: I21.9

## 2011-12-02 MED ORDER — HYDROCODONE-ACETAMINOPHEN 5-325 MG PO TABS: 1.0000 | ORAL_TABLET | ORAL | Status: DC | PRN

## 2011-12-02 MED ORDER — ONDANSETRON 4 MG PO TBDP
8.0000 mg | ORAL_TABLET | Freq: Once | ORAL | Status: AC
  Administered 2011-12-02: 8 mg via ORAL
  Filled 2011-12-02: qty 1

## 2011-12-02 MED ORDER — CYCLOBENZAPRINE HCL 10 MG PO TABS: 5.0000 mg | ORAL_TABLET | Freq: Two times a day (BID) | ORAL | Status: AC | PRN

## 2011-12-02 MED ORDER — MORPHINE SULFATE 4 MG/ML IJ SOLN
4.0000 mg | Freq: Once | INTRAMUSCULAR | Status: AC
  Administered 2011-12-02: 4 mg via INTRAMUSCULAR
  Filled 2011-12-02: qty 1

## 2011-12-02 NOTE — Progress Notes (Signed)
Pt states she had chest pain  Left chest approx 2 weeks ago, pain was sharp and did not radiate.  Pt rated the pain as a "10"- "lasted just a few minutes".  Pt denies SOB at the time.  Pt also stated that she had sharp pain left neck to shoulder a while ago, "10", lasted just a minute.  Pt states that she was sore for a couple weeks after this.  Pt has been seen by [facility] in the past-states it has been a few years.  Pt had labs drawn at [facility] approx a week ago.  I requested labs and office notes from [facility].  I will request information from [facility].  Ebony Hail will she patient while she in in pre- admit today.
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
## 2011-12-02 NOTE — Pre-Procedure Instructions (Signed)
[place]  12/02/2011   Your procedure is scheduled on:  MOnday, February 18th.  Report to [facility] at 7:30 AM.  Call this number if you have problems the morning of surgery: [phone]   Remember:   Do not eat food:After Midnight.  May have clear liquids: up to 4 Hours before arrival.  Clear liquids include soda, tea, black coffee, apple or grape juice, broth.    Take these medicines the morning of surgery with A SIP OF WATER: Amlodipine, Carvedilol, Doxazosin   Do not wear jewelry, make-up or nail polish.  Do not wear lotions, powders, or perfumes. You may wear deodorant.  Do not shave 48 hours prior to surgery.  Do not bring valuables to the hospital.  Contacts, dentures or bridgework may not be worn into surgery.  Leave suitcase in the car. After surgery it may be brought to your room.  For patients admitted to the hospital, checkout time is 11:00 AM the day of discharge.   Patients discharged the day of surgery will not be allowed to drive home.  Name and phone number of your driver: ---  Special Instructions: Incentive Spirometry - Practice and bring it with you on the day of surgery. and CHG Shower Use Special Wash: 1/2 bottle night before surgery and 1/2 bottle morning of surgery.   Please read over the following fact sheets that you were given: Pain Booklet, Coughing and Deep Breathing, MRSA Information and Surgical Site Infection Prevention
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
## 2011-12-03 NOTE — ED Provider Notes (Signed)
Medical screening examination/treatment/procedure(s) were performed by non-physician practitioner and as supervising physician I was immediately available for consultation/collaboration.  Elmer Picker, MD 12/03/11 [phone]

## 2011-12-04 MED ORDER — CEFAZOLIN SODIUM-DEXTROSE 2-3 GM-% IV SOLR
2.0000 g | INTRAVENOUS | Status: AC
  Administered 2011-12-05: 2 g via INTRAVENOUS
  Filled 2011-12-04: qty 50

## 2011-12-05 ENCOUNTER — Encounter (HOSPITAL_COMMUNITY): Payer: Self-pay | Admitting: Vascular Surgery

## 2011-12-05 ENCOUNTER — Encounter (HOSPITAL_COMMUNITY)
Admission: RE | Disposition: A | Discharge status: Home / Self Care | Payer: Self-pay | Source: Ambulatory Visit | Attending: Vascular Surgery

## 2011-12-05 ENCOUNTER — Ambulatory Visit (HOSPITAL_COMMUNITY)
Admission: RE | Admit: 2011-12-05 | Discharge: 2011-12-05 | Disposition: A | Discharge status: Home / Self Care | Payer: Managed Care, Other (non HMO) | Source: Ambulatory Visit | Attending: Vascular Surgery | Admitting: Vascular Surgery

## 2011-12-05 ENCOUNTER — Ambulatory Visit (HOSPITAL_COMMUNITY): Payer: Managed Care, Other (non HMO) | Admitting: Vascular Surgery

## 2011-12-05 DIAGNOSIS — Z01818 Encounter for other preprocedural examination: Secondary | ICD-10-CM | POA: Insufficient documentation

## 2011-12-05 DIAGNOSIS — Z87891 Personal history of nicotine dependence: Secondary | ICD-10-CM | POA: Insufficient documentation

## 2011-12-05 DIAGNOSIS — I2510 Atherosclerotic heart disease of native coronary artery without angina pectoris: Secondary | ICD-10-CM | POA: Insufficient documentation

## 2011-12-05 DIAGNOSIS — F329 Major depressive disorder, single episode, unspecified: Secondary | ICD-10-CM | POA: Insufficient documentation

## 2011-12-05 DIAGNOSIS — Z794 Long term (current) use of insulin: Secondary | ICD-10-CM | POA: Insufficient documentation

## 2011-12-05 DIAGNOSIS — I120 Hypertensive chronic kidney disease with stage 5 chronic kidney disease or end stage renal disease: Secondary | ICD-10-CM | POA: Insufficient documentation

## 2011-12-05 DIAGNOSIS — Z01810 Encounter for preprocedural cardiovascular examination: Secondary | ICD-10-CM | POA: Insufficient documentation

## 2011-12-05 DIAGNOSIS — I509 Heart failure, unspecified: Secondary | ICD-10-CM | POA: Insufficient documentation

## 2011-12-05 DIAGNOSIS — N186 End stage renal disease: Secondary | ICD-10-CM

## 2011-12-05 DIAGNOSIS — F3289 Other specified depressive episodes: Secondary | ICD-10-CM | POA: Insufficient documentation

## 2011-12-05 DIAGNOSIS — K08409 Partial loss of teeth, unspecified cause, unspecified class: Secondary | ICD-10-CM | POA: Insufficient documentation

## 2011-12-05 DIAGNOSIS — G4730 Sleep apnea, unspecified: Secondary | ICD-10-CM | POA: Insufficient documentation

## 2011-12-05 DIAGNOSIS — E1129 Type 2 diabetes mellitus with other diabetic kidney complication: Secondary | ICD-10-CM | POA: Insufficient documentation

## 2011-12-05 HISTORY — PX: AV FISTULA PLACEMENT: SHX1204

## 2011-12-05 LAB — POCT I-STAT 4, (NA,K, GLUC, HGB,HCT)
Glucose, Bld: 302 mg/dL — ABNORMAL HIGH (ref 70–99)
Glucose, Bld: 305 mg/dL — ABNORMAL HIGH (ref 70–99)
HCT: 34 % — ABNORMAL LOW (ref 36.0–46.0)
Potassium: 4.6 mEq/L (ref 3.5–5.1)

## 2011-12-05 LAB — GLUCOSE, CAPILLARY: Glucose-Capillary: 224 mg/dL — ABNORMAL HIGH (ref 70–99)

## 2011-12-05 SURGERY — ARTERIOVENOUS (AV) FISTULA CREATION
Anesthesia: Monitor Anesthesia Care | Site: Arm Lower | Laterality: Right | Wound class: Clean

## 2011-12-05 MED ORDER — ONDANSETRON HCL 4 MG/2ML IJ SOLN: 4.0000 mg | Freq: Once | INTRAMUSCULAR | Status: DC | PRN

## 2011-12-05 MED ORDER — HEPARIN SODIUM (PORCINE) 1000 UNIT/ML IJ SOLN
INTRAMUSCULAR | Status: DC | PRN
  Administered 2011-12-05: 5 mL via INTRAVENOUS

## 2011-12-05 MED ORDER — MIDAZOLAM HCL 5 MG/5ML IJ SOLN
INTRAMUSCULAR | Status: DC | PRN
  Administered 2011-12-05 (×2): 1 mg via INTRAVENOUS

## 2011-12-05 MED ORDER — FENTANYL CITRATE 0.05 MG/ML IJ SOLN
INTRAMUSCULAR | Status: DC | PRN
  Administered 2011-12-05: 25 ug via INTRAVENOUS
  Administered 2011-12-05 (×2): 50 ug via INTRAVENOUS

## 2011-12-05 MED ORDER — SODIUM CHLORIDE 0.9 % IV SOLN
INTRAVENOUS | Status: DC | PRN
  Administered 2011-12-05: 09:00:00 via INTRAVENOUS

## 2011-12-05 MED ORDER — LIDOCAINE HCL (PF) 1 % IJ SOLN
INTRAMUSCULAR | Status: DC | PRN
  Administered 2011-12-05: 16 mL

## 2011-12-05 MED ORDER — INSULIN ASPART 100 UNIT/ML ~~LOC~~ SOLN
5.0000 [IU] | Freq: Once | SUBCUTANEOUS | Status: AC
  Administered 2011-12-05: 5 [IU] via SUBCUTANEOUS

## 2011-12-05 MED ORDER — HYDROMORPHONE HCL PF 1 MG/ML IJ SOLN: 0.2500 mg | INTRAMUSCULAR | Status: DC | PRN

## 2011-12-05 MED ORDER — 0.9 % SODIUM CHLORIDE (POUR BTL) OPTIME
TOPICAL | Status: DC | PRN
  Administered 2011-12-05: 1000 mL

## 2011-12-05 MED ORDER — SODIUM CHLORIDE 0.9 % IV SOLN
200.0000 ug | INTRAVENOUS | Status: DC | PRN
  Administered 2011-12-05: 0.2 ug/kg/h via INTRAVENOUS

## 2011-12-05 MED ORDER — SODIUM CHLORIDE 0.9 % IV SOLN
0.4000 ug/kg/h | INTRAVENOUS | Status: DC
  Filled 2011-12-05: qty 2

## 2011-12-05 MED ORDER — PROTAMINE SULFATE 10 MG/ML IV SOLN
INTRAVENOUS | Status: DC | PRN
  Administered 2011-12-05 (×3): 10 mg via INTRAVENOUS
  Administered 2011-12-05: 20 mg via INTRAVENOUS

## 2011-12-05 MED ORDER — OXYCODONE HCL 5 MG PO TABS: 5.0000 mg | ORAL_TABLET | ORAL | Status: AC | PRN

## 2011-12-05 MED ORDER — SODIUM CHLORIDE 0.9 % IR SOLN
Status: DC | PRN
  Administered 2011-12-05: 10:00:00

## 2011-12-05 SURGICAL SUPPLY — 42 items
CANISTER SUCTION 2500CC (MISCELLANEOUS) ×2 IMPLANT
CLIP TI MEDIUM 6 (CLIP) ×2 IMPLANT
CLIP TI WIDE RED SMALL 6 (CLIP) ×2 IMPLANT
CLOTH BEACON ORANGE TIMEOUT ST (SAFETY) ×2 IMPLANT
COVER PROBE W GEL 5X96 (DRAPES) IMPLANT
COVER SURGICAL LIGHT HANDLE (MISCELLANEOUS) ×4 IMPLANT
DECANTER SPIKE VIAL GLASS SM (MISCELLANEOUS) IMPLANT
DERMABOND ADVANCED (GAUZE/BANDAGES/DRESSINGS) ×1
DERMABOND ADVANCED .7 DNX12 (GAUZE/BANDAGES/DRESSINGS) ×1 IMPLANT
DRAIN PENROSE 1/4X12 LTX STRL (WOUND CARE) ×2 IMPLANT
ELECT REM PT RETURN 9FT ADLT (ELECTROSURGICAL) ×2
ELECTRODE REM PT RTRN 9FT ADLT (ELECTROSURGICAL) ×1 IMPLANT
GAUZE SPONGE 2X2 8PLY STRL LF (GAUZE/BANDAGES/DRESSINGS) ×1 IMPLANT
GEL ULTRASOUND 20GR AQUASONIC (MISCELLANEOUS) IMPLANT
GLOVE BIO SURGEON STRL SZ 6.5 (GLOVE) ×2 IMPLANT
GLOVE BIO SURGEON STRL SZ7.5 (GLOVE) ×2 IMPLANT
GLOVE BIOGEL PI IND STRL 6.5 (GLOVE) ×2 IMPLANT
GLOVE BIOGEL PI IND STRL 7.0 (GLOVE) ×1 IMPLANT
GLOVE BIOGEL PI IND STRL 7.5 (GLOVE) ×1 IMPLANT
GLOVE BIOGEL PI INDICATOR 6.5 (GLOVE) ×2
GLOVE BIOGEL PI INDICATOR 7.0 (GLOVE) ×1
GLOVE BIOGEL PI INDICATOR 7.5 (GLOVE) ×1
GLOVE ECLIPSE 6.5 STRL STRAW (GLOVE) ×4 IMPLANT
GOWN PREVENTION PLUS XLARGE (GOWN DISPOSABLE) ×2 IMPLANT
GOWN STRL NON-REIN LRG LVL3 (GOWN DISPOSABLE) ×6 IMPLANT
KIT BASIN OR (CUSTOM PROCEDURE TRAY) ×2 IMPLANT
KIT ROOM TURNOVER OR (KITS) ×2 IMPLANT
LOOP VESSEL MINI RED (MISCELLANEOUS) ×2 IMPLANT
NS IRRIG 1000ML POUR BTL (IV SOLUTION) ×2 IMPLANT
PACK CV ACCESS (CUSTOM PROCEDURE TRAY) ×2 IMPLANT
PAD ARMBOARD 7.5X6 YLW CONV (MISCELLANEOUS) ×4 IMPLANT
SPONGE GAUZE 2X2 STER 10/PKG (GAUZE/BANDAGES/DRESSINGS) ×1
SPONGE SURGIFOAM ABS GEL 100 (HEMOSTASIS) IMPLANT
SUT PROLENE 6 0 CC (SUTURE) ×4 IMPLANT
SUT PROLENE 7 0 BV 1 (SUTURE) ×4 IMPLANT
SUT VIC AB 3-0 SH 27 (SUTURE) ×2
SUT VIC AB 3-0 SH 27X BRD (SUTURE) ×2 IMPLANT
SUT VICRYL 4-0 PS2 18IN ABS (SUTURE) ×4 IMPLANT
TOWEL OR 17X24 6PK STRL BLUE (TOWEL DISPOSABLE) ×2 IMPLANT
TOWEL OR 17X26 10 PK STRL BLUE (TOWEL DISPOSABLE) ×2 IMPLANT
UNDERPAD 30X30 INCONTINENT (UNDERPADS AND DIAPERS) ×2 IMPLANT
WATER STERILE IRR 1000ML POUR (IV SOLUTION) ×2 IMPLANT

## 2011-12-05 NOTE — Addendum Note (Signed)
Addendum  created 12/05/11 1137 by Josepha Pigg, CRNA   Modules edited:Anesthesia Blocks and Procedures, Inpatient Notes

## 2011-12-05 NOTE — Op Note (Signed)
Procedure: Right Radial Cephalic AV fistula  Preop: ESRD  Postop: ESRD  Anesthesia: MAC with local  Assistant:Regina Roczniak, PA-c  Findings: 3 mm cephalic vein      2  mm radial artery    Procedure Details: The rightt upper extremity was prepped and draped in usual sterile fashion. Local anesthesia was infiltrated midway between the cephalic and radial artery anatomically.  The vein was not palpable due to the patients obesity.  A longitudinal skin incision was then made in this location at the distal right forearm.  The incision was carried into the subcutaneous tissues down to level cephalic vein. The vein had some spasm but was overall reasonable quality approximately 55mm diameter.  The vein was dissected free circumferentially and small side branches ligated and divided between silk ties. This was gently distended with heparinized saline, spatulated,  and marked for orientation.  Next the radial artery was dissected free in the medial portion incision. The artery was  2 mm in diameter. The vessel loops were placed proximal and distal to the planned site of arteriotomy. The patient was given 5000 units of intravenous heparin. After appropriate circulation time, the vessel loops were used to control the artery. A longitudinal opening was made in the right radial artery. The vein was controlled proximally with a fine bulldog clamp. The vein was then swung over to the artery and sewn end of vein to side of artery using a running 7-0 Prolene suture. Just prior to completion, the anastomosis was fore bled back bled and thoroughly flushed. The anastomosis was secured, vessel loops released, and there was a palpable thrill in the fistula immediately. The patient was given 50 mg of Protamine for heparin reversal.   After hemostasis was obtained, the subcutaneous tissues were reapproximated using a running 3-0 Vicryl suture. The skin was then closed with a 4 0 Vicryl subcuticular stitch. Dermabond was  applied to the skin incision.    Ruta Hinds, MD [facility] Office: [phone] Pager: [phone]

## 2011-12-05 NOTE — H&P (View-Only) (Signed)
[facility] [place] HISTORY AND PHYSICAL   History of Present Illness:  Patient is a 56 y.o. year old female who presents for placement of a permanent hemodialysis access. The patient is left handed .  The patient is not currently on hemodialysis.  The cause of renal failure is thought to be secondary to diabetes and hypertension.  Other chronic medical problems include obesity, hyperlipidemia, CHF, anemia, sleep apnea. All of these are currently controlled.  Past Medical History  Diagnosis Date  . Hypertension   . Hyperlipidemia   . Diabetes mellitus     diagnosed in 1988, on insulin.  . Chronic kidney disease     stage 3, 2/2 DM and HTN.  . CHF (congestive heart failure)     diagnosed in 2003 during hospitalization , echo in 09/09- EF 50-55%, impaired LV relaxation, mild calcification of mitral valve, mild mitral regurg, mild tric regur, mild sclerortic  aortic valve with trace aortic reg.  . Anemia     2/2 renal failure  . Obstructive sleep apnea     does nt use CPAP as it is uncomfortable, medium full face mask with heated humidification and CPAP 10 cm  H2O  . Diabetic retinopathy     s/p PRP OS  . History of UTI   . Depression   . History of TIAs     10/08- twisted face and slurred speech without extremity weakness  . History of MI (myocardial infarction)   . Bleeding     History of retinal bleed on full dose aspirin  . Post PTCA   . S/P left heart catheterization by cutdown     July 2003, normal coronary arteries.    Past Surgical History  Procedure Date  . Hernia repair   . Cholecystectomy   . Abdominal hysterectomy      Social History History  Substance Use Topics  . Smoking status: Former Smoker -- 1.0 packs/day for 4 years    Types: Cigarettes  . Smokeless tobacco: Not on file  . Alcohol Use: No    Family History Family History  Problem Relation Age of Onset  . Cancer Mother     lung smoker  . Diabetes Mother   . Hypertension  Mother   . Heart disease Mother     CHF, died at 25  . Hyperlipidemia Mother   . Heart attack Mother   . Diabetes Father   . Hypertension Father   . Heart disease Father     CHF, died at 40  . Hyperlipidemia Father   . Heart attack Father   . Cancer Sister     breast cancer  . Diabetes Son   . Heart disease Son   . Hypertension Son   . Hypertension Maternal Grandmother   . Hypertension Paternal Grandmother   . Colon cancer Neg Hx     Allergies  Allergies  Allergen Reactions  . Codeine     REACTION: nausea, vomiting     Current Outpatient Prescriptions  Medication Sig Dispense Refill  . amLODipine (NORVASC) 10 MG tablet Take 10 mg by mouth daily.        Marland Kitchen aspirin 81 MG tablet Take 81 mg by mouth daily.        . carvedilol (COREG) 25 MG tablet Take 25 mg by mouth 2 (two) times daily with a meal.        . doxazosin (CARDURA) 1 MG tablet Take 1 mg by mouth at bedtime.        Marland Kitchen  epoetin alfa (PROCRIT) 60454 UNIT/ML injection Inject 10,000 Units into the skin. Every 2 weeks.       . fish oil-omega-3 fatty acids 1000 MG capsule Take 2 g by mouth daily.        . furosemide (LASIX) 40 MG tablet Take 40 mg by mouth daily.        . insulin aspart (NOVOLOG) 100 UNIT/ML injection Inject into the skin. 12 units in AM, 12 units at lunch and 18 units in the evening.       . insulin glargine (LANTUS) 100 UNIT/ML injection Inject 30 Units into the skin at bedtime.        . simvastatin (ZOCOR) 40 MG tablet Take 1 tablet (40 mg total) by mouth daily.  90 tablet  3    ROS:   General:  No weight loss, Fever, chills  HEENT: No recent headaches, no nasal bleeding, no visual changes, no sore throat  Neurologic: No dizziness, blackouts, seizures. No recent symptoms of stroke or mini- stroke. No recent episodes of slurred speech, or temporary blindness.  Cardiac: No recent episodes of chest pain/pressure, no shortness of breath at rest.  No shortness of breath with exertion.  Denies history  of atrial fibrillation or irregular heartbeat  Vascular: No history of rest pain in feet.  No history of claudication.  No history of non-healing ulcer, No history of DVT   Pulmonary: No home oxygen, no productive cough, no hemoptysis,  No asthma or wheezing  Musculoskeletal:  [ ]  Arthritis, [ ]  Low back pain,  [ ]  Joint pain  Hematologic:No history of hypercoagulable state.  No history of easy bleeding.  No history of anemia  Gastrointestinal: No hematochezia or melena,  No gastroesophageal reflux, no trouble swallowing  Urinary: [ ]  chronic Kidney disease, [ ]  on HD - [ ]  MWF or [ ]  TTHS, [ ]  Burning with urination, [ ]  Frequent urination, [ ]  Difficulty urinating;   Skin: No rashes  Psychological: No history of anxiety,  No history of depression   Physical Examination  Filed Vitals:   11/24/11 1437  BP: 162/79  Pulse: 84  Resp: 16  Height: 5\' 4"  (1.626 m)  Weight: 211 lb (95.709 kg)  SpO2: 99%    Body mass index is 36.22 kg/(m^2).  General:  Alert and oriented, no acute distress HEENT: Normal Neck: No bruit or JVD Pulmonary: Clear to auscultation bilaterally Cardiac: Regular Rate and Rhythm without murmur Gastrointestinal: Soft, non-tender, non-distended, no mass, obese Skin: No rash Extremity Pulses:  2+ radial, brachial pulses bilaterally, cephalic vein palpable from the wrist up to the forearm on the right side on placement of a tourniquet. The vein is slightly lateral to his usual anatomic position but easily palpable. Musculoskeletal: No deformity or edema  Neurologic: Upper and lower extremity motor 5/5 and symmetric  DATA:   Vein mapping ultrasound was performed today which I reviewed and interpreted. This shows the cephalic vein in the forearm is between 40 and 60 mm in diameter. It is difficult to visualize in the upper arm. The cephalic vein in the forearm a less than is also between 40 and 50 mm in diameter. The basilic vein was also greater than 3 mm in  diameter throughout its course bilaterally.   ASSESSMENT:   Needs hemodialysis access. Avoid the best option for her initially with be a right radiocephalic AV fistula. Risks benefits possible complications procedure details including but limited to bleeding infection non-maturation of the fistula ischemic steal  were explained to the patient today she understands and agrees to proceed. Scheduled for Monday, 12/05/2011    Ruta Hinds, MD [facility] [place] Office: [phone] Pager: [phone]

## 2011-12-05 NOTE — Anesthesia Procedure Notes (Signed)
Procedure Name: MAC Date/Time: 12/05/2011 9:06 AM Performed by: Carl Best K Pre-anesthesia Checklist: Patient identified, Timeout performed, Emergency Drugs available, Suction available and Patient being monitored Patient Re-evaluated:Patient Re-evaluated prior to inductionOxygen Delivery Method: Nasal Cannula Intubation Type: IV induction

## 2011-12-05 NOTE — Addendum Note (Signed)
Addendum  created 12/05/11 1225 by Josepha Pigg, CRNA   Modules edited:Anesthesia Medication Administration

## 2011-12-05 NOTE — Anesthesia Postprocedure Evaluation (Signed)
  Anesthesia Post-op Note  Patient: [name]  Procedure(s) Performed: Procedure(s) (LRB): ARTERIOVENOUS (AV) FISTULA CREATION (Right)  Patient Location: PACU  Anesthesia Type: MAC  Level of Consciousness: awake, alert  and oriented  Airway and Oxygen Therapy: Patient Spontanous Breathing  Post-op Pain: mild  Post-op Assessment: Post-op Vital signs reviewed and Patient's Cardiovascular Status Stable  Post-op Vital Signs: stable  Complications: No apparent anesthesia complications

## 2011-12-05 NOTE — Progress Notes (Signed)
Report to Dr. Linna Caprice, related to glucose resulted on I-STAT .

## 2011-12-05 NOTE — Anesthesia Preprocedure Evaluation (Addendum)
Anesthesia Evaluation  Patient identified by MRN, date of birth, ID band Patient awake    Reviewed: Allergy & Precautions, H&P , NPO status , Patient's Chart, lab work & pertinent test results, reviewed documented beta blocker date and time   Airway       Dental  (+) Teeth Intact, Edentulous Upper and Partial Lower   Pulmonary sleep apnea , former smoker clear to auscultation        Cardiovascular hypertension, Pt. on medications and Pt. on home beta blockers + CAD and +CHF Regular Normal    Neuro/Psych PSYCHIATRIC DISORDERS Depression    GI/Hepatic   Endo/Other  Diabetes mellitus-, Type 2, Insulin Dependent  Renal/GU      Musculoskeletal   Abdominal   Peds  Hematology   Anesthesia Other Findings   Reproductive/Obstetrics                          Anesthesia Physical Anesthesia Plan  ASA: III  Anesthesia Plan: MAC   Post-op Pain Management:    Induction: Intravenous  Airway Management Planned: Nasal Cannula  Additional Equipment:   Intra-op Plan:   Post-operative Plan:   Informed Consent: I have reviewed the patients History and Physical, chart, labs and discussed the procedure including the risks, benefits and alternatives for the proposed anesthesia with the patient or authorized representative who has indicated his/her understanding and acceptance.   Dental advisory given  Plan Discussed with: Anesthesiologist and Surgeon  Anesthesia Plan Comments: (Chronic Renal insuff not yet started HD K-5.0 H/O Chest pain, Nonobstructive CAD by Cath EF 55% by echo Htn Obesity Sleep apnea not using CPAP  Plan MAC,  Roberts Gaudy)       Anesthesia Quick Evaluation

## 2011-12-05 NOTE — Preoperative (Signed)
Beta Blockers   Reason not to administer Beta Blockers:Not Applicable 

## 2011-12-05 NOTE — Interval H&P Note (Signed)
History and Physical Interval Note:  12/05/2011 8:30 AM  [name]  has presented today for surgery, with the diagnosis of END STAGE RENAL DISEASE  The various methods of treatment have been discussed with the patient and family. After consideration of risks, benefits and other options for treatment, the patient has consented to  Procedure(s) (LRB): ARTERIOVENOUS (AV) FISTULA CREATION (Right) as a surgical intervention .  The patients' history has been reviewed, patient examined, no change in status, stable for surgery.  I have reviewed the patients' chart and labs.  Questions were answered to the patient's satisfaction.    Right radial cephalic fistula today   Gaetan Spieker E

## 2011-12-05 NOTE — Transfer of Care (Signed)
Immediate Anesthesia Transfer of Care Note  Patient: [name]  Procedure(s) Performed: Procedure(s) (LRB): ARTERIOVENOUS (AV) FISTULA CREATION (Right)  Patient Location: PACU  Anesthesia Type: MAC  Level of Consciousness: awake, alert  and oriented  Airway & Oxygen Therapy: Patient Spontanous Breathing  Post-op Assessment: Report given to PACU RN and Post -op Vital signs reviewed and stable  Post vital signs: Reviewed and stable  Complications: No apparent anesthesia complications

## 2011-12-05 NOTE — Discharge Instructions (Signed)
No diagram  12/05/2011 [name] [id number] [birth year]  Surgeon(s): Elam Dutch, MD  Procedure(s): right cimino ARTERIOVENOUS (AV) FISTULA CREATION  Comments: none  x Do not stick graft foInstructions Following General Anesthetic, Adult A nurse specialized in giving anesthesia (anesthetist) or a doctor specialized in giving anesthesia (anesthesiologist) gave you a medicine that made you sleep while a procedure was performed. For as long as 24 hours following this procedure, you may feel:  Dizzy.   Weak.   Drowsy.  AFTER THE PROCEDURE After surgery, you will be taken to the recovery area where a nurse will monitor your progress. You will be allowed to go home when you are awake, stable, taking fluids well, and without complications. For the first 24 hours following an anesthetic:  Have a responsible person with you.   Do not drive a car. If you are alone, do not take public transportation.   Do not drink alcohol.   Do not take medicine that has not been prescribed by your caregiver.   Do not sign important papers or make important decisions.   You may resume normal diet and activities as directed.   Change bandages (dressings) as directed.   Only take over-the-counter or prescription medicines for pain, discomfort, or fever as directed by your caregiver.  If you have questions or problems that seem related to the anesthetic, call the hospital and ask for the anesthetist or anesthesiologist on call. SEEK IMMEDIATE MEDICAL CARE IF:   You develop a rash.   You have difficulty breathing.   You have chest pain.   You develop any allergic problems.  Document Released: 01/09/2001 Document Revised: 06/15/2011 Document Reviewed: 08/20/2007 Exit[employer] Patient Information 2012 [employer]r 12 weeks

## 2011-12-06 ENCOUNTER — Encounter (HOSPITAL_COMMUNITY): Payer: Self-pay | Admitting: Vascular Surgery

## 2011-12-09 NOTE — Procedures (Unsigned)
CEPHALIC VEIN MAPPING  INDICATION:  Chronic kidney disease stage 4  HISTORY:  EXAM: The visualized right cephalic vein is compressible with diameter measurements ranging from 0.39 to 0.58 cm.  The right basilic vein is compressible with diameter measurements ranging from 0.3 to 0.49 cm.  The visualized left cephalic vein is compressible with diameter measurements ranging from 0.40 to 0.55 cm.  The left basilic vein is compressible with diameter measurements ranging from 0.44 to 0.61 cm.  See attached worksheet for all measurements.  IMPRESSION:  Patent bilateral cephalic and basilic veins with diameter measurements as described above and on the attached worksheet.  ___________________________________________ Jessy Oto. Fields, MD  CH/MEDQ  D:  11/25/2011  T:  11/25/2011  Job:  [id number]

## 2012-01-04 ENCOUNTER — Encounter: Payer: Self-pay | Admitting: Vascular Surgery

## 2012-01-05 ENCOUNTER — Ambulatory Visit (INDEPENDENT_AMBULATORY_CARE_PROVIDER_SITE_OTHER): Payer: No Typology Code available for payment source | Admitting: Vascular Surgery

## 2012-01-05 ENCOUNTER — Encounter: Payer: Self-pay | Admitting: Vascular Surgery

## 2012-01-05 VITALS — BP 142/69 | HR 88 | Temp 98.4°F | Resp 18 | Ht 64.0 in | Wt 215.0 lb

## 2012-01-05 DIAGNOSIS — N186 End stage renal disease: Secondary | ICD-10-CM

## 2012-01-05 NOTE — Progress Notes (Signed)
Addended by: Mena Goes on: 01/05/2012 03:53 PM   Modules accepted: Orders

## 2012-01-05 NOTE — Progress Notes (Signed)
[facility] [place] HISTORY AND PHYSICAL    History of Present Illness:  Patient is a 56 y.o. year old female who presents for post-operative follow-up after placement of a right radiocephalic AV fistula on XX123456. She denies any numbness or tingling in her hand. She has been exercising the fistula some. She is not on dialysis.   Physical Examination  Filed Vitals:   01/05/12 1522  BP: 142/69  Pulse: 88  Temp: 98.4 F (36.9 C)  Resp: 18    Body mass index is 36.90 kg/(m^2).  General:  Alert and oriented, no acute distress Neck: No bruit or JVD Skin: No rash Extremities:  The fistula is palpable at the wrist and has an audible bruit however it is more difficult to palpate in the forearm Neurologic: Upper and lower extremity motor 5/5 and symmetric   ASSESSMENT: Patent right radiocephalic AV fistula not maturing at this point. The patient will return in 4-6 weeks for fistula ultrasound and reassessment. If the fistula is not matured at that time we will consider whether or not an intervention is necessary. The patient was given a note to return to work on Monday, March 25 with no restrictions   PLAN: see above   Ruta Hinds, MD [facility] [place]: [phone] Pager: [phone]

## 2012-02-08 ENCOUNTER — Encounter: Payer: Self-pay | Admitting: Vascular Surgery

## 2012-02-09 ENCOUNTER — Ambulatory Visit (INDEPENDENT_AMBULATORY_CARE_PROVIDER_SITE_OTHER): Payer: Managed Care, Other (non HMO) | Admitting: Vascular Surgery

## 2012-02-09 ENCOUNTER — Encounter (INDEPENDENT_AMBULATORY_CARE_PROVIDER_SITE_OTHER): Payer: Managed Care, Other (non HMO) | Admitting: *Deleted

## 2012-02-09 ENCOUNTER — Encounter: Payer: Self-pay | Admitting: Vascular Surgery

## 2012-02-09 VITALS — BP 137/68 | HR 90 | Temp 98.3°F | Ht 63.0 in | Wt 218.0 lb

## 2012-02-09 DIAGNOSIS — N186 End stage renal disease: Secondary | ICD-10-CM

## 2012-02-09 DIAGNOSIS — T82898A Other specified complication of vascular prosthetic devices, implants and grafts, initial encounter: Secondary | ICD-10-CM

## 2012-02-09 NOTE — Progress Notes (Signed)
Patient is status post creation of a right radiocephalic fistula on XX123456. She is currently not on hemodialysis. She presents today for further followup and checked maturation of her fistula. She denies any numbness or tingling in her hand.  Physical exam: The right wrist incision is well-healed. The fistula is difficult to palpate. There is an audible bruit.  Data: Duplex ultrasound today was performed which shows some narrowing of the anastomosis. She also has several side branches throughout the course of the fistula in the mid forearm. The cephalic vein is 123XX123 mm in diameter. I reviewed and interpreted this study.  Assessment: Non-maturing AV fistula right forearm  Plan: Revision of AV fistula proximal anastomosis as well a sidebranch ligation. The patient had several events coming up in the next few weeks. She wishes to defer this until 03/19/2012.  Ruta Hinds, MD [facility] [place] Office: [phone] Pager: [phone]

## 2012-02-16 NOTE — Procedures (Unsigned)
VASCULAR LAB EXAM  INDICATION:  One month status post right radiocephalic fistula.  HISTORY: Diabetes: Cardiac: Hypertension:  EXAM: 1. Patent right radiocephalic fistula with elevated velocities in the     native artery at the arterial anastomosis.  Distal to the     anastomosis, the radial artery is retrograde. 2. There are multiple branches observed throughout the fistula. 3. The fistula appears to empty into the basilic system in the upper     arm. 4. Please see attached diagram for details.  IMPRESSION: 1. Patent right radiocephalic fistula with significantly increased     velocities at the arterial anastomosis. 2. Multiple branches are observed throughout the course of the     fistula.  ___________________________________________ Jessy Oto. Fields, MD  LT/MEDQ  D:  02/10/2012  T:  02/10/2012  Job:  [id number]

## 2012-02-18 ENCOUNTER — Other Ambulatory Visit: Payer: Self-pay | Admitting: Internal Medicine

## 2012-02-20 NOTE — Telephone Encounter (Signed)
Not IM patient

## 2012-03-09 ENCOUNTER — Other Ambulatory Visit: Payer: Self-pay | Admitting: *Deleted

## 2012-03-20 ENCOUNTER — Other Ambulatory Visit: Payer: Self-pay | Admitting: Internal Medicine

## 2012-03-20 NOTE — Telephone Encounter (Signed)
Not IM patient

## 2012-03-22 ENCOUNTER — Encounter (HOSPITAL_COMMUNITY): Payer: Self-pay | Admitting: Pharmacy Technician

## 2012-03-27 ENCOUNTER — Encounter (HOSPITAL_COMMUNITY): Payer: Self-pay | Admitting: *Deleted

## 2012-03-27 NOTE — Pre-Procedure Instructions (Signed)
[place]  03/27/2012   Your procedure is scheduled on:  04/02/12  Report to [facility] at 7:30 AM.  Call this number if you have problems the morning of surgery: [phone]   Remember:   Do not eat food:After Midnight.  May have clear liquids: up to 4 Hours before arrival (3:30 AM).  Clear liquids include soda, tea, black coffee, apple or grape juice, broth.  Take these medicines the morning of surgery with A SIP OF WATER: Amlodipine/Norvasc, Carvedilol/Coreg, Cardura/Doxazosin   Do not wear jewelry, make-up or nail polish.  Do not wear lotions, powders, or perfumes. You may wear deodorant.  Do not shave 48 hours prior to surgery. Men may shave face and neck.  Do not bring valuables to the hospital.  Contacts, dentures or bridgework may not be worn into surgery.  Leave suitcase in the car. After surgery it may be brought to your room.  For patients admitted to the hospital, checkout time is 11:00 AM the day of discharge.   Patients discharged the day of surgery will not be allowed to drive home.    Special Instructions: CHG Shower Use Special Wash: 1/2 bottle night before surgery and 1/2 bottle morning of surgery.   Please read over the following fact sheets that you were given: Pain Booklet, Coughing and Deep Breathing, MRSA Information and Surgical Site Infection Prevention

## 2012-03-27 NOTE — Progress Notes (Addendum)
Note to Danae Chen to request from [facility] Cardiology- most recent office note, EKG, any tests, ?cardiac cath (patient said she has not had one but there is a note in EPIC mentioning cath-not able to find in EPIC or [place].  States she was last seen 08/2011. Used to see Dr. [name] but he has left practice.  Patient states she is in process of trying to change cardiology offices.  Patient has hx of MI-states she had chest pain last night but okay today. States she ate cabbage last night and sometimes when she eats cabbage it bothers her. Will put note on chart so when patient comes in for lab appt the lab RN can double check with patient.    Patient states sleep study was done at least  5 yrs ago.

## 2012-03-29 ENCOUNTER — Encounter (HOSPITAL_COMMUNITY)
Admission: RE | Admit: 2012-03-29 | Discharge: 2012-03-29 | Disposition: A | Discharge status: Home / Self Care | Payer: Managed Care, Other (non HMO) | Source: Ambulatory Visit | Attending: Vascular Surgery | Admitting: Vascular Surgery

## 2012-03-29 LAB — SURGICAL PCR SCREEN
MRSA, PCR: NEGATIVE
Staphylococcus aureus: NEGATIVE

## 2012-03-29 NOTE — Consult Note (Signed)
Anesthesia Consult:  Patient is scheduled for revision of a right arm AVF on 04/02/12.  She is s/p creation of her right arm AVF on 12/05/11.  She is not yet on HD.  I saw her prior to her procedure in February for atypical chest pain.  (Please refer to my consult note at that time.)    Today she reported similar pains that occur about once every 2 weeks.  As before, they are sharp pains that occur in the left chest and only last for a few seconds.  They can occur with activity or rest and are not associated with any other symptoms such as diaphoresis, pre-syncope, palpitations.  She gets occasional nausea regardless of when she is having pains.  She feels in her usual state of health.  Her PCP is now Dr. Jeanie Cooks.  Dr. Marval Regal is her Nephrologist.  She says he is planning to get her re-established with a Cardiologist.  She was previously followed by Dr. [name] at [facility], and was last seen by Dr. Glenetta Hew.  She thought she had a history of MI in the past, but according to his notes, she had a history of non-ischemic CM, but with improved EF to 55-60% by October 2011 echo.     By [facility] notes, she had a nuclear stress test in October 2011 that showed no definite inducible ischemia.  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 EF 30-35%.     EKG today showed NSR.  Exam shows heart with a RRR, lungs clear.  No significant LE edema.  Her symptomology has not changed since her last surgery and her EKG remains WNL.  Anticipate she can proceed if no significant change in her status.  Anesthesiologist Dr. Linna Caprice agrees with this plan.  She was told to contact EMS is any new or progressive CV symptoms.  Myra Gianotti, PA-C

## 2012-03-29 NOTE — Pre-Procedure Instructions (Addendum)
[place]  03/29/2012   Your procedure is scheduled on:  Monday, June 17th.  Report to [facility] at 8:30 AM.  Call this number if you have problems the morning of surgery: [phone]   Remember:   Do not eat food or drink any liquids after midnight.    Take these medicines the morning of surgery with A SIP OF WATER: Amlodipine (NOrvasc), Carvedil (Coreg), and Cardura (Doxazosin),   Do not wear jewelry, make-up or nail polish.  Do not wear lotions, powders, or perfumes. You may wear deodorant.  Do not shave 48 hours prior to surgery. Men may shave face and neck.  Do not bring valuables to the hospital.  Contacts, dentures or bridgework may not be worn into surgery.  Leave suitcase in the car. After surgery it may be brought to your room.  For patients admitted to the hospital, checkout time is 11:00 AM the day of discharge.   Patients discharged the day of surgery will not be allowed to drive home.  Name and phone number of your driver: Farrel Gordon [phone]  Special Instructions: CHG Shower Use Special Wash: 1/2 bottle night before surgery and 1/2 bottle morning of surgery.   Please read over the following fact sheets that you were given: Pain Booklet, Coughing and Deep Breathing, MRSA Information and Surgical Site Infection Prevention

## 2012-03-29 NOTE — Progress Notes (Signed)
I faxed a request to [facility] for Stress Test results.

## 2012-04-01 MED ORDER — DEXTROSE 5 % IV SOLN
1.5000 g | INTRAVENOUS | Status: AC
  Administered 2012-04-02: 1.5 g via INTRAVENOUS
  Filled 2012-04-01: qty 1.5

## 2012-04-02 ENCOUNTER — Encounter (HOSPITAL_COMMUNITY)
Admission: RE | Disposition: A | Discharge status: Home / Self Care | Payer: Self-pay | Source: Ambulatory Visit | Attending: Vascular Surgery

## 2012-04-02 ENCOUNTER — Encounter (HOSPITAL_COMMUNITY): Payer: Self-pay | Admitting: Certified Registered Nurse Anesthetist

## 2012-04-02 ENCOUNTER — Encounter (HOSPITAL_COMMUNITY): Payer: Self-pay | Admitting: Vascular Surgery

## 2012-04-02 ENCOUNTER — Ambulatory Visit (HOSPITAL_COMMUNITY): Payer: Managed Care, Other (non HMO) | Admitting: Vascular Surgery

## 2012-04-02 ENCOUNTER — Ambulatory Visit (HOSPITAL_COMMUNITY)
Admission: RE | Admit: 2012-04-02 | Discharge: 2012-04-02 | Disposition: A | Discharge status: Home / Self Care | Payer: Managed Care, Other (non HMO) | Source: Ambulatory Visit | Attending: Vascular Surgery | Admitting: Vascular Surgery

## 2012-04-02 ENCOUNTER — Encounter (HOSPITAL_COMMUNITY): Payer: Self-pay | Admitting: *Deleted

## 2012-04-02 DIAGNOSIS — I509 Heart failure, unspecified: Secondary | ICD-10-CM | POA: Insufficient documentation

## 2012-04-02 DIAGNOSIS — I252 Old myocardial infarction: Secondary | ICD-10-CM | POA: Insufficient documentation

## 2012-04-02 DIAGNOSIS — M129 Arthropathy, unspecified: Secondary | ICD-10-CM | POA: Insufficient documentation

## 2012-04-02 DIAGNOSIS — I10 Essential (primary) hypertension: Secondary | ICD-10-CM | POA: Insufficient documentation

## 2012-04-02 DIAGNOSIS — E119 Type 2 diabetes mellitus without complications: Secondary | ICD-10-CM | POA: Insufficient documentation

## 2012-04-02 DIAGNOSIS — I2510 Atherosclerotic heart disease of native coronary artery without angina pectoris: Secondary | ICD-10-CM | POA: Insufficient documentation

## 2012-04-02 DIAGNOSIS — T82898A Other specified complication of vascular prosthetic devices, implants and grafts, initial encounter: Secondary | ICD-10-CM

## 2012-04-02 DIAGNOSIS — Y832 Surgical operation with anastomosis, bypass or graft as the cause of abnormal reaction of the patient, or of later complication, without mention of misadventure at the time of the procedure: Secondary | ICD-10-CM | POA: Insufficient documentation

## 2012-04-02 DIAGNOSIS — E669 Obesity, unspecified: Secondary | ICD-10-CM | POA: Insufficient documentation

## 2012-04-02 DIAGNOSIS — F3289 Other specified depressive episodes: Secondary | ICD-10-CM | POA: Insufficient documentation

## 2012-04-02 DIAGNOSIS — F329 Major depressive disorder, single episode, unspecified: Secondary | ICD-10-CM | POA: Insufficient documentation

## 2012-04-02 DIAGNOSIS — N289 Disorder of kidney and ureter, unspecified: Secondary | ICD-10-CM | POA: Insufficient documentation

## 2012-04-02 DIAGNOSIS — Z87891 Personal history of nicotine dependence: Secondary | ICD-10-CM | POA: Insufficient documentation

## 2012-04-02 DIAGNOSIS — Z01810 Encounter for preprocedural cardiovascular examination: Secondary | ICD-10-CM | POA: Insufficient documentation

## 2012-04-02 DIAGNOSIS — N186 End stage renal disease: Secondary | ICD-10-CM

## 2012-04-02 DIAGNOSIS — G4730 Sleep apnea, unspecified: Secondary | ICD-10-CM | POA: Insufficient documentation

## 2012-04-02 LAB — GLUCOSE, CAPILLARY
Glucose-Capillary: 182 mg/dL — ABNORMAL HIGH (ref 70–99)
Glucose-Capillary: 164 mg/dL — ABNORMAL HIGH (ref 70–99)

## 2012-04-02 LAB — POCT I-STAT 4, (NA,K, GLUC, HGB,HCT)
Glucose, Bld: 335 mg/dL — ABNORMAL HIGH (ref 70–99)
Potassium: 4.5 mEq/L (ref 3.5–5.1)
Sodium: 134 mEq/L — ABNORMAL LOW (ref 135–145)

## 2012-04-02 SURGERY — REVISON OF ARTERIOVENOUS FISTULA
Anesthesia: Monitor Anesthesia Care | Site: Arm Lower | Laterality: Right | Wound class: Clean

## 2012-04-02 MED ORDER — 0.9 % SODIUM CHLORIDE (POUR BTL) OPTIME
TOPICAL | Status: DC | PRN
  Administered 2012-04-02: 1000 mL

## 2012-04-02 MED ORDER — PROPOFOL 10 MG/ML IV EMUL
INTRAVENOUS | Status: DC | PRN
  Administered 2012-04-02: 120 ug/kg/min via INTRAVENOUS

## 2012-04-02 MED ORDER — LIDOCAINE HCL (PF) 1 % IJ SOLN
INTRAMUSCULAR | Status: DC | PRN
  Administered 2012-04-02: 10 mL via INTRADERMAL

## 2012-04-02 MED ORDER — SODIUM CHLORIDE 0.9 % IV SOLN
INTRAVENOUS | Status: DC | PRN
  Administered 2012-04-02: 14:00:00 via INTRAVENOUS

## 2012-04-02 MED ORDER — TRAMADOL HCL 50 MG PO TABS: 50.0000 mg | ORAL_TABLET | Freq: Four times a day (QID) | ORAL | Status: AC | PRN

## 2012-04-02 MED ORDER — SODIUM CHLORIDE 0.9 % IV SOLN: INTRAVENOUS | Status: DC

## 2012-04-02 MED ORDER — INSULIN ASPART 100 UNIT/ML ~~LOC~~ SOLN
SUBCUTANEOUS | Status: AC
  Administered 2012-04-02: 8 [IU] via SUBCUTANEOUS
  Filled 2012-04-02: qty 1

## 2012-04-02 MED ORDER — FENTANYL CITRATE 0.05 MG/ML IJ SOLN
INTRAMUSCULAR | Status: DC | PRN
  Administered 2012-04-02: 100 ug via INTRAVENOUS

## 2012-04-02 MED ORDER — ONDANSETRON HCL 4 MG/2ML IJ SOLN: 4.0000 mg | Freq: Four times a day (QID) | INTRAMUSCULAR | Status: DC | PRN

## 2012-04-02 MED ORDER — FENTANYL CITRATE 0.05 MG/ML IJ SOLN
INTRAMUSCULAR | Status: AC
  Filled 2012-04-02: qty 2

## 2012-04-02 MED ORDER — INSULIN ASPART 100 UNIT/ML ~~LOC~~ SOLN
8.0000 [IU] | Freq: Once | SUBCUTANEOUS | Status: AC
  Administered 2012-04-02: 8 [IU] via SUBCUTANEOUS

## 2012-04-02 MED ORDER — MIDAZOLAM HCL 5 MG/5ML IJ SOLN
INTRAMUSCULAR | Status: DC | PRN
  Administered 2012-04-02: 2 mg via INTRAVENOUS

## 2012-04-02 MED ORDER — FENTANYL CITRATE 0.05 MG/ML IJ SOLN
25.0000 ug | INTRAMUSCULAR | Status: DC | PRN
  Administered 2012-04-02: 25 ug via INTRAVENOUS

## 2012-04-02 MED ORDER — HEPARIN SODIUM (PORCINE) 1000 UNIT/ML IJ SOLN
INTRAMUSCULAR | Status: DC | PRN
  Administered 2012-04-02: 5000 [IU] via INTRAVENOUS

## 2012-04-02 MED ORDER — HEPARIN SODIUM (PORCINE) 5000 UNIT/ML IJ SOLN
INTRAMUSCULAR | Status: DC | PRN
  Administered 2012-04-02: 14:00:00

## 2012-04-02 SURGICAL SUPPLY — 42 items
CANISTER SUCTION 2500CC (MISCELLANEOUS) ×2 IMPLANT
CLIP TI MEDIUM 6 (CLIP) ×2 IMPLANT
CLIP TI WIDE RED SMALL 6 (CLIP) ×2 IMPLANT
CLOTH BEACON ORANGE TIMEOUT ST (SAFETY) ×2 IMPLANT
COVER PROBE W GEL 5X96 (DRAPES) ×2 IMPLANT
COVER SURGICAL LIGHT HANDLE (MISCELLANEOUS) ×4 IMPLANT
DECANTER SPIKE VIAL GLASS SM (MISCELLANEOUS) ×2 IMPLANT
DERMABOND ADVANCED (GAUZE/BANDAGES/DRESSINGS) ×1
DERMABOND ADVANCED .7 DNX12 (GAUZE/BANDAGES/DRESSINGS) ×1 IMPLANT
DRAIN PENROSE 1/4X12 LTX STRL (WOUND CARE) ×2 IMPLANT
ELECT REM PT RETURN 9FT ADLT (ELECTROSURGICAL) ×2
ELECTRODE REM PT RTRN 9FT ADLT (ELECTROSURGICAL) ×1 IMPLANT
GAUZE SPONGE 2X2 8PLY STRL LF (GAUZE/BANDAGES/DRESSINGS) ×1 IMPLANT
GEL ULTRASOUND 20GR AQUASONIC (MISCELLANEOUS) IMPLANT
GLOVE BIO SURGEON STRL SZ 6.5 (GLOVE) ×6 IMPLANT
GLOVE BIO SURGEON STRL SZ7.5 (GLOVE) ×2 IMPLANT
GLOVE BIO SURGEON STRL SZ8 (GLOVE) ×2 IMPLANT
GLOVE BIOGEL PI IND STRL 6.5 (GLOVE) ×2 IMPLANT
GLOVE BIOGEL PI IND STRL 8 (GLOVE) ×1 IMPLANT
GLOVE BIOGEL PI INDICATOR 6.5 (GLOVE) ×2
GLOVE BIOGEL PI INDICATOR 8 (GLOVE) ×1
GOWN PREVENTION PLUS XLARGE (GOWN DISPOSABLE) ×2 IMPLANT
GOWN STRL NON-REIN LRG LVL3 (GOWN DISPOSABLE) ×4 IMPLANT
GOWN STRL REIN 3XL LVL4 (GOWN DISPOSABLE) ×2 IMPLANT
KIT BASIN OR (CUSTOM PROCEDURE TRAY) ×2 IMPLANT
KIT ROOM TURNOVER OR (KITS) ×2 IMPLANT
LOOP VESSEL MINI RED (MISCELLANEOUS) IMPLANT
NS IRRIG 1000ML POUR BTL (IV SOLUTION) ×2 IMPLANT
PACK CV ACCESS (CUSTOM PROCEDURE TRAY) ×2 IMPLANT
PAD ARMBOARD 7.5X6 YLW CONV (MISCELLANEOUS) ×4 IMPLANT
PATCH VASCULAR VASCU GUARD 1X6 (Vascular Products) ×2 IMPLANT
SPONGE GAUZE 2X2 STER 10/PKG (GAUZE/BANDAGES/DRESSINGS) ×1
SPONGE SURGIFOAM ABS GEL 100 (HEMOSTASIS) IMPLANT
SUT PROLENE 7 0 BV 1 (SUTURE) ×4 IMPLANT
SUT VIC AB 3-0 SH 27 (SUTURE) ×1
SUT VIC AB 3-0 SH 27X BRD (SUTURE) ×1 IMPLANT
SUT VICRYL 4-0 PS2 18IN ABS (SUTURE) ×2 IMPLANT
TAPE STRIPS DRAPE STRL (GAUZE/BANDAGES/DRESSINGS) ×2 IMPLANT
TOWEL OR 17X24 6PK STRL BLUE (TOWEL DISPOSABLE) ×2 IMPLANT
TOWEL OR 17X26 10 PK STRL BLUE (TOWEL DISPOSABLE) ×2 IMPLANT
UNDERPAD 30X30 INCONTINENT (UNDERPADS AND DIAPERS) ×2 IMPLANT
WATER STERILE IRR 1000ML POUR (IV SOLUTION) ×2 IMPLANT

## 2012-04-02 NOTE — Transfer of Care (Signed)
Immediate Anesthesia Transfer of Care Note  Patient: [name]  Procedure(s) Performed: Procedure(s) (LRB): REVISON OF ARTERIOVENOUS FISTULA (Right)  Patient Location: PACU  Anesthesia Type: MAC  Level of Consciousness: awake, alert , oriented and patient cooperative  Airway & Oxygen Therapy: Patient Spontanous Breathing and Patient connected to face mask oxygen  Post-op Assessment: Report given to PACU RN, Post -op Vital signs reviewed and stable and Patient moving all extremities  Post vital signs: Reviewed and stable  Complications: No apparent anesthesia complications

## 2012-04-02 NOTE — Preoperative (Signed)
Beta Blockers   Reason not to administer Beta Blockers:Not Applicable 

## 2012-04-02 NOTE — Progress Notes (Signed)
Patient blood sugar upon arrival via Istat is 335.  Patient did not take any of her normal insulin regimen this morning or last night.  Anesthesia notified and orders for novolog 8 units.  Vergie Living

## 2012-04-02 NOTE — Anesthesia Postprocedure Evaluation (Signed)
  Anesthesia Post-op Note  Patient: [name]  Procedure(s) Performed: Procedure(s) (LRB): REVISON OF ARTERIOVENOUS FISTULA (Right)  Patient Location: PACU  Anesthesia Type: General  Level of Consciousness: awake, alert  and oriented  Airway and Oxygen Therapy: Patient Spontanous Breathing and Patient connected to nasal cannula oxygen  Post-op Pain: mild  Post-op Assessment: Post-op Vital signs reviewed  Post-op Vital Signs: Reviewed  Complications: No apparent anesthesia complications

## 2012-04-02 NOTE — Anesthesia Preprocedure Evaluation (Signed)
Anesthesia Evaluation  Patient identified by MRN, date of birth, ID band Patient awake    Reviewed: Allergy & Precautions, H&P , NPO status , Patient's Chart, lab work & pertinent test results  Airway Mallampati: II  Neck ROM: full    Dental   Pulmonary sleep apnea , former smoker         Cardiovascular hypertension, + CAD, + Past MI and +CHF     Neuro/Psych Depression    GI/Hepatic   Endo/Other  Diabetes mellitus-, Type 2obese  Renal/GU Renal InsufficiencyRenal disease     Musculoskeletal  (+) Arthritis -,   Abdominal   Peds  Hematology   Anesthesia Other Findings   Reproductive/Obstetrics                           Anesthesia Physical Anesthesia Plan  ASA: III  Anesthesia Plan: MAC   Post-op Pain Management:    Induction: Intravenous  Airway Management Planned: Simple Face Mask  Additional Equipment:   Intra-op Plan:   Post-operative Plan:   Informed Consent: I have reviewed the patients History and Physical, chart, labs and discussed the procedure including the risks, benefits and alternatives for the proposed anesthesia with the patient or authorized representative who has indicated his/her understanding and acceptance.     Plan Discussed with: CRNA and Surgeon  Anesthesia Plan Comments:         Anesthesia Quick Evaluation

## 2012-04-02 NOTE — Op Note (Signed)
Procedure: Revision Right Radial Cephalic AV Fistula PreOp: Poorly functioning AVF PostOp: same Anesthesia: Local with sedation Asst: Evorn Gong, PA-c  Findings: diffuse narrowing proximal fistula repaired with bovine pericardial patch  Operative details: After obtaining informed consent, the patient was taken to the operating room. The patient was placed in supine position on the operating table. After induction of general anesthesia, the patient's entire right upper extremity was prepped and draped in the correct in the usual sterile fashion. A longitudinal incision was made through a pre-existing scar near the right wrist. The incision was carried onto the subcutaneous tissues down to level of the pre-existing AV fistula. The fistula was patent and did have a thrill within it.  Next the fistula was dissected free all the way down to level of the proximal anastomosis.The proximal 2 cm of the fistula was 4-6 mm in diameter but the vein was diffusely small 2 mm distal to this with no real endpoint.   The patient was given 5000 units of intravenous heparin. The fistula was clamped at the level of the anastomosis as well as just distal to the stenosed segment. A longitudinal opening was made in the fistula with a 11 blade. There was a thickened spot of intimal hypoplasia over approximately 1 cm. A bovine pericardial patch was brought up on the operative field and sewn on as a patch angioplasty using a running 6-0 Prolene suture. Just prior to completion of the anastomosis, it was forebled backbled and thoroughly flushed.  The anastomosis was secured; clamps released; and  a palpable thrill was palpable in the fistula immediately. Hemostasis was obtained with direct pressure. The vein had some spasm but was diffusely small and it was my hope that patching this segment would allow the fistula to develop distally.  Next subcutaneous tissues were reapproximated using running 3-0 Vicryl suture. The skin was  closed with 4 Vicryl subcuticular stitch. The patient are procedure well and there were no complications. Instrument sponge and needle counts were correct at the end of the case. The patient was taken to the recovery room in stable condition.  Ruta Hinds, MD [facility] [place] Office: [phone] Pager: [phone]

## 2012-04-02 NOTE — Discharge Instructions (Signed)
    04/02/2012 [name] [id number] 1956/07/12  Surgeon(s): Elam Dutch, MD  Procedure(s): REVISON OF ARTERIOVENOUS FISTULA-right radio cephalic        x Do not stick graft for until pt returns to see Dr. Oneida Alar

## 2012-04-02 NOTE — H&P (Signed)
VVS History and physical   Patient is status post creation of a right radiocephalic fistula on XX123456. She is currently not on hemodialysis. She presents today for further followup and checked maturation of her fistula. She denies any numbness or tingling in her hand.   Physical exam: The right wrist incision is well-healed. The fistula is difficult to palpate. There is an audible bruit.   Data: Duplex ultrasound today was performed which shows some narrowing of the anastomosis. She also has several side branches throughout the course of the fistula in the mid forearm. The cephalic vein is 123XX123 mm in diameter. I reviewed and interpreted this study.   Assessment: Non-maturing AV fistula right forearm   Plan: Revision of AV fistula proximal anastomosis as well a sidebranch ligation.   Ruta Hinds, MD  [facility] [place]  Office: [phone]  Pager: [phone]

## 2012-04-03 ENCOUNTER — Telehealth: Payer: Self-pay | Admitting: Vascular Surgery

## 2012-04-03 NOTE — Telephone Encounter (Signed)
LVM to notify patient, letter sent also,dpm

## 2012-04-03 NOTE — Telephone Encounter (Signed)
Message copied by Gena Fray on Tue Apr 03, 2012  9:24 AM ------      Message from: Peter Minium K      Created: Mon Apr 02, 2012  4:18 PM      Regarding: schedule                   ----- Message -----         From: Gabriel Earing, PA         Sent: 04/02/2012   3:49 PM           To: Mena Goes, CMA                        [name],Danielle HFemale, 56 y.o., 1956-03-21            S/p revision right radio cephalic AVF by CEF      F/uu with him in 4 weeks.      She does not need a duplex.            Thanks!      Aldona Bar

## 2012-04-23 DIAGNOSIS — N186 End stage renal disease: Secondary | ICD-10-CM

## 2012-05-09 ENCOUNTER — Encounter: Payer: Self-pay | Admitting: Vascular Surgery

## 2012-05-10 ENCOUNTER — Ambulatory Visit (INDEPENDENT_AMBULATORY_CARE_PROVIDER_SITE_OTHER): Payer: PRIVATE HEALTH INSURANCE | Admitting: Vascular Surgery

## 2012-05-10 ENCOUNTER — Encounter: Payer: Self-pay | Admitting: Vascular Surgery

## 2012-05-10 VITALS — BP 158/73 | HR 82 | Temp 98.2°F | Ht 64.0 in | Wt 209.0 lb

## 2012-05-10 DIAGNOSIS — N186 End stage renal disease: Secondary | ICD-10-CM

## 2012-05-10 NOTE — Progress Notes (Signed)
Patient is a 56 year old female who is status post revision of her right radiocephalic AV fistula on June 17. She had a bovine pericardial patch placed on the proximal portion of the fistula. She returns today for further followup. She is a small amount of drainage from the proximal aspect of the incision. She is able to hear a bruit within the fistula.  Physical exam: Filed Vitals:   05/10/12 1142  BP: 158/73  Pulse: 82  Temp: 98.2 F (36.8 C)  TempSrc: Oral  Height: 5\' 4"  (1.626 m)  Weight: 209 lb (94.802 kg)  SpO2: 100%   Right wrist incision is well-healed at the distal portion. The proximal 1 cm has an area of eschar with a small amount of thick drainage. This was debrided and irrigated under soap and water today. The wound is clean underneath this. There is an audible bruit in the fistula.  Assessment: Healing wound status post fistula revision. The fistula still not develop enough for cannulation.  Plan: She will be local wound care with soap and water daily and a small spot of antibiotic ointment on the wound. She return in 6 weeks for an ultrasound of the fistula. It is not developing at that point we may need to consider a new access.  Ruta Hinds, MD [facility] [place] Office: [phone] Pager: [phone]

## 2012-05-16 ENCOUNTER — Encounter: Payer: Self-pay | Admitting: Vascular Surgery

## 2012-05-16 ENCOUNTER — Other Ambulatory Visit: Payer: Self-pay | Admitting: Vascular Surgery

## 2012-05-16 ENCOUNTER — Ambulatory Visit (INDEPENDENT_AMBULATORY_CARE_PROVIDER_SITE_OTHER): Payer: PRIVATE HEALTH INSURANCE | Admitting: Vascular Surgery

## 2012-05-16 VITALS — BP 158/76 | HR 91 | Temp 99.1°F | Ht 64.0 in | Wt 209.0 lb

## 2012-05-16 DIAGNOSIS — N186 End stage renal disease: Secondary | ICD-10-CM

## 2012-05-16 NOTE — Progress Notes (Signed)
[facility] [place]  Patient name: [name] MRN: [id number] DOB: [birth year] Sex: female  REASON FOR VISIT: swelling at proximal right wrist incision  HPI: [name] is a 56 y.o. female who was seen on 05/10/2012 in the office by Dr. Oneida Alar. She had a revision of a right radiocephalic AV fistula on 123456. The proximal aspect of her incision had a small amount of drainage and this was debrided and irrigated. She comes in today she developed recurrent swelling at the site. She has not had a fever that she is aware of.   REVIEW OF SYSTEMS: Valu.Nieves ] denotes positive finding; [  ] denotes negative finding  CARDIOVASCULAR:  [ ]  chest pain   [ ]  dyspnea on exertion    CONSTITUTIONAL:  [ ]  fever   [ ]  chills  PHYSICAL EXAM: Filed Vitals:   05/16/12 1546  BP: 158/76  Pulse: 91  Temp: 99.1 F (37.3 C)  TempSrc: Oral  Height: 5\' 4"  (1.626 m)  Weight: 209 lb (94.802 kg)  SpO2: 100%   Body mass index is 35.87 kg/(m^2). GENERAL: The patient is a well-nourished female, in no acute distress. The vital signs are documented above. CARDIOVASCULAR: There is a regular rate and rhythm  PULMONARY: There is good air exchange bilaterally without wheezing or rales. The fistula has a bruit and thrill. There is swelling at the proximal incision and I I&D this in the office today and obtain a culture.  MEDICAL ISSUES: I've instructed her to keep the wound clean and dry and covered with a Band-Aid. She will change this daily and as needed. If this fails to heal she'll call for an appointment sooner otherwise she is scheduled to see Dr. Oneida Alar back approximately 5 weeks with a duplex scan of her fistula.  Stamps [facility] [place] Beeper: [phone]

## 2012-05-19 LAB — WOUND CULTURE

## 2012-05-23 ENCOUNTER — Telehealth: Payer: Self-pay

## 2012-05-23 DIAGNOSIS — T8149XA Infection following a procedure, other surgical site, initial encounter: Secondary | ICD-10-CM

## 2012-05-23 MED ORDER — CLINDAMYCIN HCL 300 MG PO CAPS: 300.0000 mg | ORAL_CAPSULE | Freq: Four times a day (QID) | ORAL | Status: AC

## 2012-05-23 NOTE — Telephone Encounter (Signed)
Rec'd staff message regarding starting pt. on antibiotic for positive MRSA culture from right radiocephalic AVF site, wound.  Asked Dr. Scot Dock for dosage directions for the Clindamycin, as pt. Has not started on Hemodialysis.  Rec'd v.o. For Clindamycin 300 mg po, qid x 10 days

## 2012-05-23 NOTE — Telephone Encounter (Signed)
Message copied by Denman George on Wed May 23, 2012 12:54 PM ------      Message from: Angelia Mould      Created: Tue May 22, 2012  5:32 PM      Regarding: culture result       This patient has an MRSA from a culture obtained in the office. If she is on dialysis she did receive nitroglycerin at the time of dialysis. If she is not on dialysis she could receive po clindamycin.      Thank you. CSD

## 2012-06-28 ENCOUNTER — Ambulatory Visit: Payer: PRIVATE HEALTH INSURANCE | Admitting: Vascular Surgery

## 2012-07-17 ENCOUNTER — Other Ambulatory Visit: Payer: Self-pay | Admitting: *Deleted

## 2012-07-17 DIAGNOSIS — Z4931 Encounter for adequacy testing for hemodialysis: Secondary | ICD-10-CM

## 2012-07-17 DIAGNOSIS — N186 End stage renal disease: Secondary | ICD-10-CM

## 2012-07-18 ENCOUNTER — Encounter: Payer: Self-pay | Admitting: Vascular Surgery

## 2012-07-19 ENCOUNTER — Encounter: Payer: Self-pay | Admitting: Vascular Surgery

## 2012-07-19 ENCOUNTER — Encounter (INDEPENDENT_AMBULATORY_CARE_PROVIDER_SITE_OTHER): Payer: Managed Care, Other (non HMO) | Admitting: *Deleted

## 2012-07-19 ENCOUNTER — Ambulatory Visit (INDEPENDENT_AMBULATORY_CARE_PROVIDER_SITE_OTHER): Payer: Managed Care, Other (non HMO) | Admitting: Vascular Surgery

## 2012-07-19 VITALS — BP 152/72 | HR 91 | Resp 16 | Ht 64.0 in | Wt 213.0 lb

## 2012-07-19 DIAGNOSIS — T82898A Other specified complication of vascular prosthetic devices, implants and grafts, initial encounter: Secondary | ICD-10-CM

## 2012-07-19 DIAGNOSIS — N186 End stage renal disease: Secondary | ICD-10-CM

## 2012-07-19 DIAGNOSIS — T82598A Other mechanical complication of other cardiac and vascular devices and implants, initial encounter: Secondary | ICD-10-CM

## 2012-07-19 DIAGNOSIS — Z4931 Encounter for adequacy testing for hemodialysis: Secondary | ICD-10-CM

## 2012-07-19 NOTE — Progress Notes (Signed)
Patient is a 56 year old female who previously had a right radiocephalic AV fistula placed in June of 2013. She is currently not on hemodialysis. She returns today for further followup. She denies numbness or tingling in her hand.  Review of systems: She denies shortness of breath. She denies chest pain. She denies skin itching.  Physical exam: Filed Vitals:   07/19/12 1158  BP: 152/72  Pulse: 91  Resp: 16  Height: 5\' 4"  (1.626 m)  Weight: 213 lb (96.616 kg)  SpO2: 100%   Right upper extremity: Well-healed wrist incision palpable thrill in fistula with audible bruit however the vein is fairly deep as you proceed up the arm  Chest clear to auscultation  Cardiac regular rate and rhythm  Data: The patient had a duplex ultrasound of her fistula today which shows the fistula is patent the velocities at the arterial anastomosis were somewhat elevated. There were a few branches in the main portion of the fistula. The fistula diameter was between 4 and 7 mm. However, the depth was 1 cm or greater in several locations.  Assessment: Maturing AV fistula but fairly deep  Plan: If the patient is approaching need for dialysis she most likely will need Superficialization. However, if it appears that her kidney function is stable I would defer this operation until she is more near need for use of the fistula. She has followup scheduled with Dr. Arty Baumgartner early next week. She will call me to schedule Superficialization if he believes we would need to go ahead and proceed.  Ruta Hinds, MD [facility] [place] Office: [phone] Pager: [phone]

## 2013-01-01 ENCOUNTER — Encounter: Payer: Self-pay | Admitting: Vascular Surgery

## 2013-01-02 ENCOUNTER — Encounter: Payer: Self-pay | Admitting: Vascular Surgery

## 2013-01-03 ENCOUNTER — Ambulatory Visit: Payer: Self-pay | Admitting: Vascular Surgery

## 2013-01-09 ENCOUNTER — Other Ambulatory Visit: Payer: Self-pay | Admitting: *Deleted

## 2013-01-09 DIAGNOSIS — N186 End stage renal disease: Secondary | ICD-10-CM

## 2013-01-09 DIAGNOSIS — Z4931 Encounter for adequacy testing for hemodialysis: Secondary | ICD-10-CM

## 2013-01-11 ENCOUNTER — Other Ambulatory Visit: Payer: Self-pay

## 2013-01-11 DIAGNOSIS — T82598A Other mechanical complication of other cardiac and vascular devices and implants, initial encounter: Secondary | ICD-10-CM

## 2013-01-16 ENCOUNTER — Encounter: Payer: Self-pay | Admitting: Vascular Surgery

## 2013-01-17 ENCOUNTER — Ambulatory Visit (INDEPENDENT_AMBULATORY_CARE_PROVIDER_SITE_OTHER): Payer: Medicaid Other | Admitting: Vascular Surgery

## 2013-01-17 ENCOUNTER — Encounter: Payer: Self-pay | Admitting: Vascular Surgery

## 2013-01-17 ENCOUNTER — Encounter (INDEPENDENT_AMBULATORY_CARE_PROVIDER_SITE_OTHER): Payer: Medicaid Other | Admitting: *Deleted

## 2013-01-17 VITALS — BP 167/71 | HR 84 | Ht 64.0 in | Wt 204.0 lb

## 2013-01-17 DIAGNOSIS — N186 End stage renal disease: Secondary | ICD-10-CM

## 2013-01-17 DIAGNOSIS — Z4931 Encounter for adequacy testing for hemodialysis: Secondary | ICD-10-CM

## 2013-01-17 DIAGNOSIS — T82598A Other mechanical complication of other cardiac and vascular devices and implants, initial encounter: Secondary | ICD-10-CM

## 2013-01-17 NOTE — Progress Notes (Signed)
This is a 57 year old female who presents for followup today. She had a right radiocephalic AV fistula placed in June of 2013. She is currently not on hemodialysis. However Dr. Arty Baumgartner was concerned that she may need dialysis soon. She returns today for further followup to assess maturity of her fistula. She denies any numbness or tingling in her hand. She does have a spot that is hyperesthetic between her first and second digits on the dorsal aspect of her hand.  Review of systems: She denies skin itching. She denies shortness of breath. She denies chest pain.  Past Medical History  Diagnosis Date  . Hypertension   . Hyperlipidemia   . Diabetes mellitus     diagnosed in 1988, on insulin.  . Anemia     2/2 renal failure  . Diabetic retinopathy(362.0)     s/p PRP OS  . History of UTI   . Depression   . History of TIAs     10/08- twisted face and slurred speech without extremity weakness  . History of MI (myocardial infarction)   . Bleeding     History of retinal bleed on full dose aspirin  . S/P left heart catheterization by cutdown     July 2003, normal coronary arteries.  . Arthritis     ? Hands  . Chronic kidney disease     stage 3, 2/2 DM and HTN.-not on diaylsis yet  . CHF (congestive heart failure)   . Myocardial infarction     per patient about 5 yrs ago  . Obstructive sleep apnea     does nt use CPAP as it is uncomfortable, medium full face mask with heated humidification and CPAP 10 cm  H2O  . Palpitations   . Chest pain   . Hyperparathyroidism   . Thyroid disease    Past Surgical History  Procedure Laterality Date  . Hernia repair    . Cholecystectomy    . Abdominal hysterectomy    . Umbical hernia    . Eye surgery      Laser DM   . Av fistula placement  12/05/2011    Procedure: ARTERIOVENOUS (AV) FISTULA CREATION;  Surgeon: Elam Dutch, MD;  Location: [facility];  Service: Vascular;  Laterality: Right;  Creation of Arteriovenous fistula right lower arm      Physical exam:  Filed Vitals:   01/17/13 1036  BP: 167/71  Pulse: 84  Height: 5\' 4"  (1.626 m)  Weight: 204 lb (92.534 kg)  SpO2: 100%   Right upper extremity: Well-healed wrist incision with easily palpable thrill over the proximal 4-5 cm of the fistula. The fistula becomes more difficult to palpate in the mid forearm.  Data: The patient had a fistula duplex exam today that I reviewed and interpreted. The fistula diameter is 5.4-7.1 cm in diameter. It is 5.4-5.7 mm in depth.  Assessment: Fistula diameter and depth are both slightly marginal.  Plan: I believe the best option at this point would be to return to the operating room to Superficialization fistula to make it easier to cannulate. The patient currently is in the process of getting Medicaid approved. She wishes to defer scheduling an operation until Medicaid is fully instituted. She will call to schedule this in the near future.  Ruta Hinds, MD [facility] Office: [phone] Pager: [phone]

## 2013-01-18 ENCOUNTER — Encounter: Payer: Self-pay | Admitting: Nephrology

## 2013-03-12 ENCOUNTER — Encounter: Payer: Self-pay | Admitting: Nephrology

## 2013-03-26 ENCOUNTER — Encounter: Payer: Self-pay | Admitting: *Deleted

## 2013-03-26 ENCOUNTER — Other Ambulatory Visit: Payer: Self-pay | Admitting: *Deleted

## 2013-03-27 ENCOUNTER — Encounter: Payer: Self-pay | Admitting: Pulmonary Disease

## 2013-03-27 ENCOUNTER — Ambulatory Visit (INDEPENDENT_AMBULATORY_CARE_PROVIDER_SITE_OTHER): Payer: Medicaid Other | Admitting: Pulmonary Disease

## 2013-03-27 VITALS — BP 124/64 | HR 87 | Temp 98.4°F | Ht 64.0 in | Wt 197.0 lb

## 2013-03-27 DIAGNOSIS — G4733 Obstructive sleep apnea (adult) (pediatric): Secondary | ICD-10-CM | POA: Insufficient documentation

## 2013-03-27 NOTE — Patient Instructions (Addendum)
Will schedule for sleep study, and arrange followup with me once results are available.  Work on weight loss.  Do not drive if you are sleepy.

## 2013-03-27 NOTE — Assessment & Plan Note (Signed)
The patient has a history of obstructive sleep apnea in the distant past, but did not respond well to CPAP.  However, she tells me that it was never adjusted from the very beginning.  Her CPAP but she quit working, and now she is having worsening symptoms that are classic for significant sleep disordered breathing.  I think she needs to have a repeat sleep study, and treatment with CPAP if she qualifies.  I have asked her to work on weight loss, and also reminded her of her moral responsibility to not drive if she is sleepy.

## 2013-03-27 NOTE — Progress Notes (Signed)
Subjective:    Patient ID: [name], female    DOB: 08-07-1956, 57 y.o.   MRN: [id number]  HPI Patient is a 57 year old female who I am asked to see for management of obstructive sleep apnea.  She tells me that she had a sleep study in 2003, and was started on CPAP for treatment.  She wore this for approximately 2-3 years, but she had issues with nasal dryness and no one ever adjusted the device.  She did not feel the machine was helping her, and then it spontaneously quit working.  She only wore the device for about 3 years.  Currently, she has been noted to have loud snoring, as well as an abnormal breathing pattern during sleep according to her husband.  She does not feel rested in the mornings upon arising, and has severe daytime sleepiness with any period of inactivity.  She falls asleep watching television in the evening, and does have sleep pressure with driving.  She states that her weight is down from 2 years ago, but is unsure how much.  Her Epworth Sleepiness Scale today is 24.   Sleep Questionnaire What time do you typically go to bed?( Between what hours) 7-8p 7-8p at 1358 on 03/27/13 by Danella Maiers, CMA How long does it take you to fall asleep? within 10mins within 59mins at 1358 on 03/27/13 by Ivy Lynn Mabe, CMA How many times during the night do you wake up? 1 1 at 1358 on 03/27/13 by Danella Maiers, CMA What time do you get out of bed to start your day? 0500 0500 at 1358 on 03/27/13 by Danella Maiers, CMA Do you drive or operate heavy machinery in your occupation? No No at 1358 on 03/27/13 by Danella Maiers, CMA How much has your weight changed (up or down) over the past two years? (In pounds) 10 lb (4.536 kg)10 lb (4.536 kg) 8-10lb +/- at 1358 on 03/27/13 by Danella Maiers, CMA Have you ever had a sleep study before? Yes Yes at 1358 on 03/27/13 by Danella Maiers, CMA If yes, [facility] [address] [address] at 1358 on  03/27/13 by Danella Maiers, CMA If yes, date of study? 2003? 2003? at 1358 on 03/27/13 by Danella Maiers, CMA Do you currently use CPAP? No No at 1358 on 03/27/13 by Ivy Lynn Mabe, CMA Do you wear oxygen at any time? No No at 1358 on 03/27/13 by Ivy Lynn Mabe, CMA   Review of Systems  Constitutional: Negative for fever and unexpected weight change.  HENT: Negative for ear pain, nosebleeds, congestion, sore throat, rhinorrhea, sneezing, trouble swallowing, dental problem, postnasal drip and sinus pressure.   Eyes: Negative for redness and itching.  Respiratory: Positive for cough and shortness of breath. Negative for chest tightness and wheezing.   Cardiovascular: Negative for palpitations and leg swelling.  Gastrointestinal: Positive for nausea and vomiting.  Genitourinary: Negative for dysuria.  Musculoskeletal: Negative for joint swelling.  Skin: Negative for rash.  Neurological: Positive for headaches.  Hematological: Does not bruise/bleed easily.  Psychiatric/Behavioral: Positive for dysphoric mood. The patient is nervous/anxious.        Objective:   Physical Exam Constitutional:  Overweight female, no acute distress  HENT:  Nares patent without discharge  Oropharynx without exudate, palate and uvula very thick and elongated.   Eyes:  Perrla, eomi, no scleral icterus  Neck:  No JVD, no TMG  Cardiovascular:  Normal rate, regular rhythm, no rubs or gallops.  No murmurs        Intact distal pulses but decreased  Pulmonary :  Normal breath sounds, no stridor or respiratory distress   No rales, rhonchi, or wheezing  Abdominal:  Soft, nondistended, bowel sounds present.  No tenderness noted.   Musculoskeletal:  1+ lower extremity edema noted.  Lymph Nodes:  No cervical lymphadenopathy noted  Skin:  No cyanosis noted  Neurologic:  Alert, appropriate, moves all 4 extremities without obvious deficit.         Assessment & Plan:

## 2013-04-09 ENCOUNTER — Encounter (HOSPITAL_COMMUNITY): Payer: Self-pay | Admitting: Pharmacy Technician

## 2013-04-10 ENCOUNTER — Encounter (HOSPITAL_COMMUNITY): Payer: Self-pay

## 2013-04-10 ENCOUNTER — Encounter (HOSPITAL_COMMUNITY)
Admission: RE | Admit: 2013-04-10 | Discharge: 2013-04-10 | Disposition: A | Discharge status: Home / Self Care | Payer: Medicaid Other | Source: Ambulatory Visit | Attending: Anesthesiology | Admitting: Anesthesiology

## 2013-04-10 ENCOUNTER — Encounter (HOSPITAL_COMMUNITY)
Admission: RE | Admit: 2013-04-10 | Discharge: 2013-04-10 | Disposition: A | Discharge status: Home / Self Care | Payer: Medicaid Other | Source: Ambulatory Visit | Attending: Vascular Surgery | Admitting: Vascular Surgery

## 2013-04-10 ENCOUNTER — Other Ambulatory Visit (HOSPITAL_COMMUNITY): Payer: Medicaid Other

## 2013-04-10 HISTORY — DX: Pneumonia, unspecified organism: J18.9

## 2013-04-10 LAB — SURGICAL PCR SCREEN: Staphylococcus aureus: NEGATIVE

## 2013-04-10 NOTE — Pre-Procedure Instructions (Signed)
[name]  04/10/2013   Your procedure is scheduled on:  04/17/13  Report to [facility] at 830 AM.  Call this number if you have problems the morning of surgery: [phone]   Remember:   Do not eat food or drink liquids after midnight.   Take these medicines the morning of surgery with A SIP OF WATER: amlodipine,,carvedilol, doxazosin   Do not wear jewelry, make-up or nail polish.  Do not wear lotions, powders, or perfumes. You may wear deodorant.  Do not shave 48 hours prior to surgery. Men may shave face and neck.  Do not bring valuables to the hospital.  [facility] is not responsible                   for any belongings or valuables.  Contacts, dentures or bridgework may not be worn into surgery.  Leave suitcase in the car. After surgery it may be brought to your room.  For patients admitted to the hospital, checkout time is 11:00 AM the day of  discharge.   Patients discharged the day of surgery will not be allowed to drive  home.  Name and phone number of your driver:   Special Instructions: Shower using CHG 2 nights before surgery and the night before surgery.  If you shower the day of surgery use CHG.  Use special wash - you have one bottle of CHG for all showers.  You should use approximately 1/3 of the bottle for each shower.   Please read over the following fact sheets that you were given: Pain Booklet, Coughing and Deep Breathing, MRSA Information and Surgical Site Infection Prevention

## 2013-04-10 NOTE — Progress Notes (Signed)
Note dr Gwenette Greet 6/14,   No cardiologist >4 yrs. Chart left for a zelanek pa to review

## 2013-04-16 MED ORDER — CEFUROXIME SODIUM 1.5 G IJ SOLR
1.5000 g | INTRAMUSCULAR | Status: AC
  Administered 2013-04-17: 1.5 g via INTRAVENOUS
  Filled 2013-04-16: qty 1.5

## 2013-04-17 ENCOUNTER — Other Ambulatory Visit: Payer: Self-pay | Admitting: *Deleted

## 2013-04-17 ENCOUNTER — Encounter (HOSPITAL_COMMUNITY)
Admission: RE | Disposition: A | Discharge status: Home / Self Care | Payer: Self-pay | Source: Ambulatory Visit | Attending: Vascular Surgery

## 2013-04-17 ENCOUNTER — Telehealth: Payer: Self-pay | Admitting: Vascular Surgery

## 2013-04-17 ENCOUNTER — Ambulatory Visit (HOSPITAL_COMMUNITY): Payer: Medicaid Other | Admitting: *Deleted

## 2013-04-17 ENCOUNTER — Encounter (HOSPITAL_COMMUNITY): Payer: Self-pay | Admitting: *Deleted

## 2013-04-17 ENCOUNTER — Ambulatory Visit (HOSPITAL_COMMUNITY)
Admission: RE | Admit: 2013-04-17 | Discharge: 2013-04-17 | Disposition: A | Discharge status: Home / Self Care | Payer: Medicaid Other | Source: Ambulatory Visit | Attending: Vascular Surgery | Admitting: Vascular Surgery

## 2013-04-17 DIAGNOSIS — I120 Hypertensive chronic kidney disease with stage 5 chronic kidney disease or end stage renal disease: Secondary | ICD-10-CM | POA: Insufficient documentation

## 2013-04-17 DIAGNOSIS — T82898A Other specified complication of vascular prosthetic devices, implants and grafts, initial encounter: Secondary | ICD-10-CM

## 2013-04-17 DIAGNOSIS — E11319 Type 2 diabetes mellitus with unspecified diabetic retinopathy without macular edema: Secondary | ICD-10-CM | POA: Insufficient documentation

## 2013-04-17 DIAGNOSIS — E1139 Type 2 diabetes mellitus with other diabetic ophthalmic complication: Secondary | ICD-10-CM | POA: Insufficient documentation

## 2013-04-17 DIAGNOSIS — E785 Hyperlipidemia, unspecified: Secondary | ICD-10-CM | POA: Insufficient documentation

## 2013-04-17 DIAGNOSIS — F329 Major depressive disorder, single episode, unspecified: Secondary | ICD-10-CM | POA: Insufficient documentation

## 2013-04-17 DIAGNOSIS — E1129 Type 2 diabetes mellitus with other diabetic kidney complication: Secondary | ICD-10-CM | POA: Insufficient documentation

## 2013-04-17 DIAGNOSIS — I509 Heart failure, unspecified: Secondary | ICD-10-CM | POA: Insufficient documentation

## 2013-04-17 DIAGNOSIS — Y832 Surgical operation with anastomosis, bypass or graft as the cause of abnormal reaction of the patient, or of later complication, without mention of misadventure at the time of the procedure: Secondary | ICD-10-CM | POA: Insufficient documentation

## 2013-04-17 DIAGNOSIS — Z8673 Personal history of transient ischemic attack (TIA), and cerebral infarction without residual deficits: Secondary | ICD-10-CM | POA: Insufficient documentation

## 2013-04-17 DIAGNOSIS — M129 Arthropathy, unspecified: Secondary | ICD-10-CM | POA: Insufficient documentation

## 2013-04-17 DIAGNOSIS — E669 Obesity, unspecified: Secondary | ICD-10-CM | POA: Insufficient documentation

## 2013-04-17 DIAGNOSIS — Z794 Long term (current) use of insulin: Secondary | ICD-10-CM | POA: Insufficient documentation

## 2013-04-17 DIAGNOSIS — E079 Disorder of thyroid, unspecified: Secondary | ICD-10-CM | POA: Insufficient documentation

## 2013-04-17 DIAGNOSIS — I252 Old myocardial infarction: Secondary | ICD-10-CM | POA: Insufficient documentation

## 2013-04-17 DIAGNOSIS — G4733 Obstructive sleep apnea (adult) (pediatric): Secondary | ICD-10-CM | POA: Insufficient documentation

## 2013-04-17 DIAGNOSIS — N186 End stage renal disease: Secondary | ICD-10-CM

## 2013-04-17 DIAGNOSIS — F3289 Other specified depressive episodes: Secondary | ICD-10-CM | POA: Insufficient documentation

## 2013-04-17 DIAGNOSIS — Z4931 Encounter for adequacy testing for hemodialysis: Secondary | ICD-10-CM

## 2013-04-17 DIAGNOSIS — D631 Anemia in chronic kidney disease: Secondary | ICD-10-CM | POA: Insufficient documentation

## 2013-04-17 DIAGNOSIS — E213 Hyperparathyroidism, unspecified: Secondary | ICD-10-CM | POA: Insufficient documentation

## 2013-04-17 DIAGNOSIS — Z87891 Personal history of nicotine dependence: Secondary | ICD-10-CM | POA: Insufficient documentation

## 2013-04-17 HISTORY — PX: REVISON OF ARTERIOVENOUS FISTULA: SHX6074

## 2013-04-17 LAB — POCT I-STAT 4, (NA,K, GLUC, HGB,HCT): HCT: 35 % — ABNORMAL LOW (ref 36.0–46.0)

## 2013-04-17 LAB — GLUCOSE, CAPILLARY
Glucose-Capillary: 114 mg/dL — ABNORMAL HIGH (ref 70–99)
Glucose-Capillary: 89 mg/dL (ref 70–99)

## 2013-04-17 SURGERY — REVISON OF ARTERIOVENOUS FISTULA
Anesthesia: General | Site: Arm Upper | Laterality: Right | Wound class: Clean

## 2013-04-17 MED ORDER — GLYCOPYRROLATE 0.2 MG/ML IJ SOLN
INTRAMUSCULAR | Status: DC | PRN
  Administered 2013-04-17: 0.1 mg via INTRAVENOUS

## 2013-04-17 MED ORDER — ONDANSETRON HCL 4 MG/2ML IJ SOLN: 4.0000 mg | Freq: Four times a day (QID) | INTRAMUSCULAR | Status: DC | PRN

## 2013-04-17 MED ORDER — 0.9 % SODIUM CHLORIDE (POUR BTL) OPTIME
TOPICAL | Status: DC | PRN
  Administered 2013-04-17: 1000 mL

## 2013-04-17 MED ORDER — OXYCODONE HCL 5 MG PO TABS: 5.0000 mg | ORAL_TABLET | Freq: Four times a day (QID) | ORAL | Status: DC | PRN

## 2013-04-17 MED ORDER — SODIUM CHLORIDE 0.9 % IV SOLN
INTRAVENOUS | Status: DC
  Administered 2013-04-17: 20 mL/h via INTRAVENOUS

## 2013-04-17 MED ORDER — SODIUM CHLORIDE 0.9 % IR SOLN
Status: DC | PRN
  Administered 2013-04-17: 13:00:00

## 2013-04-17 MED ORDER — LIDOCAINE HCL (CARDIAC) 20 MG/ML IV SOLN
INTRAVENOUS | Status: DC | PRN
  Administered 2013-04-17: 80 mg via INTRAVENOUS

## 2013-04-17 MED ORDER — FENTANYL CITRATE 0.05 MG/ML IJ SOLN: 25.0000 ug | INTRAMUSCULAR | Status: DC | PRN

## 2013-04-17 MED ORDER — PROPOFOL 10 MG/ML IV BOLUS
INTRAVENOUS | Status: DC | PRN
  Administered 2013-04-17: 80 mg via INTRAVENOUS
  Administered 2013-04-17: 200 mg via INTRAVENOUS

## 2013-04-17 MED ORDER — ONDANSETRON HCL 4 MG/2ML IJ SOLN
INTRAMUSCULAR | Status: DC | PRN
  Administered 2013-04-17: 4 mg via INTRAVENOUS

## 2013-04-17 MED ORDER — FENTANYL CITRATE 0.05 MG/ML IJ SOLN
INTRAMUSCULAR | Status: DC | PRN
  Administered 2013-04-17 (×6): 25 ug via INTRAVENOUS

## 2013-04-17 MED ORDER — FENTANYL CITRATE 0.05 MG/ML IJ SOLN
INTRAMUSCULAR | Status: AC
  Filled 2013-04-17: qty 2

## 2013-04-17 SURGICAL SUPPLY — 40 items
CANISTER SUCTION 2500CC (MISCELLANEOUS) ×2 IMPLANT
CLIP TI MEDIUM 6 (CLIP) ×2 IMPLANT
CLIP TI WIDE RED SMALL 6 (CLIP) ×2 IMPLANT
CLOTH BEACON ORANGE TIMEOUT ST (SAFETY) ×2 IMPLANT
COVER PROBE W GEL 5X96 (DRAPES) IMPLANT
COVER SURGICAL LIGHT HANDLE (MISCELLANEOUS) ×2 IMPLANT
DECANTER SPIKE VIAL GLASS SM (MISCELLANEOUS) IMPLANT
DERMABOND ADHESIVE PROPEN (GAUZE/BANDAGES/DRESSINGS) ×1
DERMABOND ADVANCED (GAUZE/BANDAGES/DRESSINGS) ×1
DERMABOND ADVANCED .7 DNX12 (GAUZE/BANDAGES/DRESSINGS) ×1 IMPLANT
DERMABOND ADVANCED .7 DNX6 (GAUZE/BANDAGES/DRESSINGS) ×1 IMPLANT
DRAIN PENROSE 1/4X12 LTX STRL (WOUND CARE) IMPLANT
ELECT REM PT RETURN 9FT ADLT (ELECTROSURGICAL) ×2
ELECTRODE REM PT RTRN 9FT ADLT (ELECTROSURGICAL) ×1 IMPLANT
GEL ULTRASOUND 20GR AQUASONIC (MISCELLANEOUS) IMPLANT
GLOVE BIO SURGEON STRL SZ 6.5 (GLOVE) ×4 IMPLANT
GLOVE BIO SURGEON STRL SZ7.5 (GLOVE) ×2 IMPLANT
GLOVE BIOGEL PI IND STRL 7.5 (GLOVE) ×1 IMPLANT
GLOVE BIOGEL PI INDICATOR 7.5 (GLOVE) ×1
GLOVE SS BIOGEL STRL SZ 7 (GLOVE) ×1 IMPLANT
GLOVE SUPERSENSE BIOGEL SZ 7 (GLOVE) ×1
GLOVE SURG SS PI 7.0 STRL IVOR (GLOVE) ×2 IMPLANT
GOWN PREVENTION PLUS XLARGE (GOWN DISPOSABLE) ×2 IMPLANT
GOWN STRL NON-REIN LRG LVL3 (GOWN DISPOSABLE) ×2 IMPLANT
GOWN STRL REIN XL XLG (GOWN DISPOSABLE) ×2 IMPLANT
KIT BASIN OR (CUSTOM PROCEDURE TRAY) ×2 IMPLANT
KIT ROOM TURNOVER OR (KITS) ×2 IMPLANT
LOOP VESSEL MINI RED (MISCELLANEOUS) IMPLANT
NS IRRIG 1000ML POUR BTL (IV SOLUTION) ×2 IMPLANT
PACK CV ACCESS (CUSTOM PROCEDURE TRAY) ×2 IMPLANT
PAD ARMBOARD 7.5X6 YLW CONV (MISCELLANEOUS) ×4 IMPLANT
SPONGE SURGIFOAM ABS GEL 100 (HEMOSTASIS) IMPLANT
SUT PROLENE 7 0 BV 1 (SUTURE) ×2 IMPLANT
SUT VIC AB 3-0 SH 27 (SUTURE) ×2
SUT VIC AB 3-0 SH 27X BRD (SUTURE) ×2 IMPLANT
SUT VICRYL 4-0 PS2 18IN ABS (SUTURE) ×6 IMPLANT
TOWEL OR 17X24 6PK STRL BLUE (TOWEL DISPOSABLE) ×2 IMPLANT
TOWEL OR 17X26 10 PK STRL BLUE (TOWEL DISPOSABLE) ×2 IMPLANT
UNDERPAD 30X30 INCONTINENT (UNDERPADS AND DIAPERS) ×2 IMPLANT
WATER STERILE IRR 1000ML POUR (IV SOLUTION) ×2 IMPLANT

## 2013-04-17 NOTE — Anesthesia Postprocedure Evaluation (Signed)
Anesthesia Post Note  Patient: [name]  Procedure(s) Performed: Procedure(s) (LRB): REVISON OF ARTERIOVENOUS FISTULA and superficialization (Right)  Anesthesia type: General  Patient location: PACU  Post pain: Pain level controlled and Adequate analgesia  Post assessment: Post-op Vital signs reviewed, Patient's Cardiovascular Status Stable, Respiratory Function Stable, Patent Airway and Pain level controlled  Last Vitals:  Filed Vitals:   04/17/13 1415  BP: 165/72  Pulse: 77  Temp: 36.3 C  Resp: 17    Post vital signs: Reviewed and stable  Level of consciousness: awake, alert  and oriented  Complications: No apparent anesthesia complications

## 2013-04-17 NOTE — Anesthesia Preprocedure Evaluation (Signed)
Anesthesia Evaluation  Patient identified by MRN, date of birth, ID band Patient awake    Reviewed: Allergy & Precautions, H&P , NPO status , Patient's Chart, lab work & pertinent test results  Airway Mallampati: II  Neck ROM: full    Dental   Pulmonary shortness of breath, sleep apnea , former smoker,          Cardiovascular hypertension, + Past MI and +CHF     Neuro/Psych Depression TIA   GI/Hepatic   Endo/Other  diabetes, Type 2obese  Renal/GU ESRFRenal disease     Musculoskeletal  (+) Arthritis -,   Abdominal   Peds  Hematology   Anesthesia Other Findings   Reproductive/Obstetrics                           Anesthesia Physical Anesthesia Plan  ASA: III  Anesthesia Plan: General   Post-op Pain Management:    Induction: Intravenous  Airway Management Planned: LMA  Additional Equipment:   Intra-op Plan:   Post-operative Plan:   Informed Consent: I have reviewed the patients History and Physical, chart, labs and discussed the procedure including the risks, benefits and alternatives for the proposed anesthesia with the patient or authorized representative who has indicated his/her understanding and acceptance.     Plan Discussed with: CRNA, Anesthesiologist and Surgeon  Anesthesia Plan Comments:         Anesthesia Quick Evaluation

## 2013-04-17 NOTE — Preoperative (Signed)
Beta Blockers   Reason not to administer Beta Blockers:Not Applicable 

## 2013-04-17 NOTE — Telephone Encounter (Addendum)
Message copied by Doristine Section on Wed Apr 17, 2013  5:11 PM ------      Message from: [place], [place] K      Created: Wed Apr 17, 2013  3:20 PM      Regarding: schedule                   ----- Message -----         From: Gabriel Earing, PA-C         Sent: 04/17/2013   1:29 PM           To: Mena Goes, CMA            S/p superficialization of radiocephalic AVF 7.2.14.  F/u with Dr. Oneida Alar in 4 weeks with duplex of AVF.            Thanks,      Aldona Bar ------  NOTIFIED PATIENT OF FU APPT. WITH DR. FIELDS ON 05-16-13 10:30

## 2013-04-17 NOTE — Transfer of Care (Signed)
Immediate Anesthesia Transfer of Care Note  Patient: [name]  Procedure(s) Performed: Procedure(s): REVISON OF ARTERIOVENOUS FISTULA and superficialization (Right)  Patient Location: PACU  Anesthesia Type:General  Level of Consciousness: awake, alert , oriented, patient cooperative and responds to stimulation  Airway & Oxygen Therapy: Patient Spontanous Breathing and Patient connected to nasal cannula oxygen  Post-op Assessment: Report given to PACU RN and Post -op Vital signs reviewed and stable  Post vital signs: Reviewed and stable  Complications: No apparent anesthesia complications

## 2013-04-17 NOTE — H&P (Signed)
This is a 57 year old female who presents for followup today. She had a right radiocephalic AV fistula placed in June of 2013. She is currently not on hemodialysis. However Dr. Arty Baumgartner was concerned that she may need dialysis soon. She returns today for further followup to assess maturity of her fistula. She denies any numbness or tingling in her hand. She does have a spot that is hyperesthetic between her first and second digits on the dorsal aspect of her hand.  Review of systems: She denies skin itching. She denies shortness of breath. She denies chest pain.  Past Medical History   Diagnosis  Date   .  Hypertension    .  Hyperlipidemia    .  Diabetes mellitus      diagnosed in 1988, on insulin.   .  Anemia      2/2 renal failure   .  Diabetic retinopathy(362.0)      s/p PRP OS   .  History of UTI    .  Depression    .  History of TIAs      10/08- twisted face and slurred speech without extremity weakness   .  History of MI (myocardial infarction)    .  Bleeding      History of retinal bleed on full dose aspirin   .  S/P left heart catheterization by cutdown      July 2003, normal coronary arteries.   .  Arthritis      ? Hands   .  Chronic kidney disease      stage 3, 2/2 DM and HTN.-not on diaylsis yet   .  CHF (congestive heart failure)    .  Myocardial infarction      per patient about 5 yrs ago   .  Obstructive sleep apnea      does nt use CPAP as it is uncomfortable, medium full face mask with heated humidification and CPAP 10 cm H2O   .  Palpitations    .  Chest pain    .  Hyperparathyroidism    .  Thyroid disease     Past Surgical History   Procedure  Laterality  Date   .  Hernia repair     .  Cholecystectomy     .  Abdominal hysterectomy     .  Umbical hernia     .  Eye surgery       Laser DM   .  Av fistula placement   12/05/2011     Procedure: ARTERIOVENOUS (AV) FISTULA CREATION; Surgeon: Elam Dutch, MD; Location: [facility] OR; Service: Vascular; Laterality:  Right; Creation of Arteriovenous fistula right lower arm   Physical exam:  Filed Vitals:   04/17/13 0843  BP: 150/80  Pulse: 79  Temp: 97.7 F (36.5 C)  TempSrc: Oral  Resp: 18  SpO2: 99%   Right upper extremity: Well-healed wrist incision with easily palpable thrill over the proximal 4-5 cm of the fistula. The fistula becomes more difficult to palpate in the mid forearm.   Data: The patient had a fistula duplex exam today that I reviewed and interpreted. The fistula diameter is 5.4-7.1 cm in diameter. It is 5.4-5.7 mm in depth.   Assessment: Fistula diameter and depth are both slightly marginal.   Plan: I believe the best option at this point would be to return to the operating room to Superficialization fistula to make it easier to cannulate. The patient currently is in the process of getting  Medicaid approved. She wishes to defer scheduling an operation until Medicaid is fully instituted. She will call to schedule this in the near future.   Ruta Hinds, MD  [facility]  Office: [phone]  Pager: [phone]

## 2013-04-17 NOTE — Op Note (Addendum)
Procedure: Right Radial Cephalic AV Fistula side branch ligation and superficialization PreOp: Poorly functioning AVF PostOp: same Anesthesia: General Asst: Gerri Lins PA-C  Findings: 4 large side branches ligated, diameter of main fistula 4-5 mm  Operative details: After obtaining informed consent, the patient was taken to the operating room. The patient was placed in supine position on the operating table. After general anesthesia was administered, the patient's entire right upper extremity was prepped and draped in the correct in the usual sterile fashion.  The incision was carried onto the subcutaneous tissues down to level of the pre-existing AV fistula. The fistula was patent and did have a thrill within it. The fistula was dissected free circumferentially in each incision and all side branches identified and ligated and divided between silk ties.  The main fistula was 4-5 mm diameter.  After all side branches were ligated the subcutaneous tissues under each skin bridge were debrided of subcutaneous fat to superficialize the fistula.    The skin of each incision was closed with a 4  0 Vicryl subcuticular stitch. The patient tolerated the procedure well and there were no complications. Instrument sponge and needle counts were correct at the end of the case. The patient was taken to the recovery room in stable condition.  Ruta Hinds, MD [facility] Office: [phone] Pager: [phone]

## 2013-04-18 ENCOUNTER — Encounter (HOSPITAL_COMMUNITY): Payer: Self-pay | Admitting: Vascular Surgery

## 2013-04-25 ENCOUNTER — Ambulatory Visit (HOSPITAL_BASED_OUTPATIENT_CLINIC_OR_DEPARTMENT_OTHER): Payer: Medicaid Other | Attending: Pulmonary Disease | Admitting: Radiology

## 2013-04-25 VITALS — Ht 64.0 in | Wt 189.0 lb

## 2013-04-25 DIAGNOSIS — G4733 Obstructive sleep apnea (adult) (pediatric): Secondary | ICD-10-CM | POA: Insufficient documentation

## 2013-05-03 ENCOUNTER — Telehealth: Payer: Self-pay | Admitting: Pulmonary Disease

## 2013-05-03 ENCOUNTER — Other Ambulatory Visit: Payer: Self-pay | Admitting: Pulmonary Disease

## 2013-05-03 DIAGNOSIS — G4733 Obstructive sleep apnea (adult) (pediatric): Secondary | ICD-10-CM

## 2013-05-03 NOTE — Telephone Encounter (Signed)
Results have been explained to patient, pt expressed understanding. Nothing further needed. Patient scheduled fro 6 week f/u appt 9/2 at 9

## 2013-05-03 NOTE — Telephone Encounter (Signed)
Please let pt know that her sleep study shows sleep apnea, and would like to restart her on cpap.  I will send order to [facility].  She needs to call me if tolerance issues, and needs to see me in 6 weeks.

## 2013-05-03 NOTE — Procedures (Signed)
[name], [name]                  ACCOUNT NO.:  [id number]  MEDICAL RECORD NO.:  [id number]          PATIENT TYPE:  OUT  LOCATION:  SLEEP CENTER                 FACILITY:  [facility]  PHYSICIAN:  Kathee Delton, MD,FCCPDATE OF BIRTH:  1956/02/17  DATE OF STUDY:  04/25/2013                           NOCTURNAL POLYSOMNOGRAM  REFERRING PHYSICIAN:  Kathee Delton, MD,FCCP  INDICATION FOR STUDY:  Hypersomnia with sleep apnea.  EPWORTH SLEEPINESS SCORE:  22.  MEDICATIONS:  SLEEP ARCHITECTURE:  The patient had a total sleep time of 349 minutes with no slow-wave sleep and adequate quantity of REM.  Sleep onset latency was normal at 11 minutes and REM onset was slightly prolonged at 113 minutes.  Sleep efficiency was mildly reduced at 87%.  RESPIRATORY DATA:  The patient was found to have 25 apneas and 52 obstructive hypopneas, giving her an apnea-hypopnea index of 13 events per hour.  The events occurred in all body positions, but were primarily during REM.  Loud snoring was noted throughout.  OXYGEN DATA:  There was O2 desaturation as low as 79% during her obstructive events.  CARDIAC DATA:  Rare PVC noted, but no clinically significant arrhythmias were seen.  MOVEMENT-PARASOMNIA:  The patient had no significant leg jerks or other abnormalities of behavior.  IMPRESSIONS-RECOMMENDATIONS:  1.,  Mild obstructive sleep apnea/hypopnea syndrome, with an AHI of 13 events per hour, but a REM AHI of 48 events per hour.  Treatment for this degree of sleep apnea can include a trial of weight loss alone, upper airway surgery, dental appliance, and also CPAP.  Clinical correlation is suggested. 1. Rare PVC noted, but no clinically significant arrhythmias were     seen.     Kathee Delton, MD,FCCP [place], [place] Board of Sleep Medicine    [facility]MED[facility]  D:  05/03/2013 08:22:00  T:  05/03/2013 08:49:49  Job:  [id number]

## 2013-05-07 ENCOUNTER — Encounter: Payer: Self-pay | Admitting: Nephrology

## 2013-05-15 ENCOUNTER — Encounter: Payer: Self-pay | Admitting: Vascular Surgery

## 2013-05-16 ENCOUNTER — Ambulatory Visit (INDEPENDENT_AMBULATORY_CARE_PROVIDER_SITE_OTHER): Payer: Medicaid Other | Admitting: Vascular Surgery

## 2013-05-16 ENCOUNTER — Encounter: Payer: Self-pay | Admitting: Vascular Surgery

## 2013-05-16 ENCOUNTER — Ambulatory Visit: Payer: Medicaid Other | Admitting: Vascular Surgery

## 2013-05-16 VITALS — BP 150/74 | HR 85 | Resp 16 | Ht 64.0 in | Wt 198.0 lb

## 2013-05-16 DIAGNOSIS — Z4931 Encounter for adequacy testing for hemodialysis: Secondary | ICD-10-CM

## 2013-05-16 DIAGNOSIS — N186 End stage renal disease: Secondary | ICD-10-CM

## 2013-05-16 NOTE — Progress Notes (Signed)
Patient is a 57 year old female returns today for followup after revision of her right radiocephalic fistula on July 2. She underwent multiple side branch ligation Superficialization of the fistula. She has no steal symptoms in her hand. Incisions are essentially healed at this point. She's not on dialysis.  Physical exam:  Filed Vitals:   05/16/13 1302  BP: 150/74  Pulse: 85  Resp: 16  Height: 5\' 4"  (1.626 m)  Weight: 198 lb (89.812 kg)  SpO2: 99%   Incisions right forearm continuing to heal fistulas easily palpable with palpable thrill and audible bruit  Assessment: Maturing right arm AV fistula  Plan: Followup as needed fistula should be ready for use in 3-4 weeks  Ruta Hinds, MD [facility] Office: [phone] Pager: [phone]

## 2013-06-18 ENCOUNTER — Encounter: Payer: Self-pay | Admitting: Pulmonary Disease

## 2013-06-18 ENCOUNTER — Ambulatory Visit (INDEPENDENT_AMBULATORY_CARE_PROVIDER_SITE_OTHER): Payer: Medicaid Other | Admitting: Pulmonary Disease

## 2013-06-18 VITALS — BP 138/76 | HR 88 | Temp 97.9°F | Ht 64.0 in | Wt 192.8 lb

## 2013-06-18 DIAGNOSIS — G4733 Obstructive sleep apnea (adult) (pediatric): Secondary | ICD-10-CM

## 2013-06-18 NOTE — Progress Notes (Signed)
  Subjective:    Patient ID: [name], female    DOB: [birth year], 57 y.o.   MRN: [id number]  HPI The patient comes in today for followup after her recent sleep study.  She was found to have mild obstructive sleep apnea with an AHI of 13 events per hour, but a REM AHI of 48 minutes per hour.  I have reviewed this study with her in detail, and answered all of her questions.  In the interim, she has been started on CPAP at a pressure level of 8 cm of water, and has been tolerating this well.  She is having no mask or pressure issues, but is having some nocturnal cough and describes postnasal drip.   Review of Systems  Constitutional: Negative for fever and unexpected weight change.  HENT: Negative for ear pain, nosebleeds, congestion, sore throat, rhinorrhea, sneezing, trouble swallowing, dental problem, postnasal drip and sinus pressure.   Eyes: Negative for redness and itching.  Respiratory: Positive for cough. Negative for chest tightness, shortness of breath and wheezing.   Cardiovascular: Negative for palpitations and leg swelling.  Gastrointestinal: Positive for nausea ( 3 weeks) and vomiting.  Genitourinary: Negative for dysuria.  Musculoskeletal: Negative for joint swelling.  Skin: Negative for rash.  Neurological: Positive for headaches.  Hematological: Does not bruise/bleed easily.  Psychiatric/Behavioral: Positive for dysphoric mood. The patient is nervous/anxious.        Objective:   Physical Exam Overweight female in no acute distress Nose without purulence or discharge noted No skin breakdown or pressure necrosis from the CPAP mask Neck without lymphadenopathy or thyromegaly Lower extremities with mild edema, no cyanosis Alert and oriented, moves all 4 extremities.       Assessment & Plan:

## 2013-06-18 NOTE — Patient Instructions (Addendum)
Continue on cpap, and we will put your machine on the auto setting to optimize your pressure.  I can then set your machine on the optimal pressure level. Work on weight loss Can try zyrtec 10mg  over the counter each night at bedtime to see if this helps your cough.  followup with me in 51mos.

## 2013-06-18 NOTE — Assessment & Plan Note (Signed)
The patient was noticed with mild sleep apnea that was primarily REM dependent.  She has been started on CPAP because of her significant symptoms, and has seen definite improvement in her sleep and daytime alertness.  She is having no issues with her mask fit or pressure.  At this point, we will need to optimize her pressure on the automatic setting.  I have also encouraged her to work aggressively on weight loss. Care Plan:  At this point, will arrange for the patient's machine to be changed over to auto mode for 2 weeks to optimize their pressure.  I will review the downloaded data once sent by dme, and also evaluate for compliance, leaks, and residual osa.  I will call the patient and dme to discuss the results, and have the patient's machine set appropriately.  This will serve as the pt's cpap pressure titration.

## 2013-06-19 ENCOUNTER — Encounter: Payer: Self-pay | Admitting: Nephrology

## 2013-06-20 ENCOUNTER — Ambulatory Visit: Payer: Medicaid Other | Admitting: Vascular Surgery

## 2013-06-28 ENCOUNTER — Other Ambulatory Visit (HOSPITAL_COMMUNITY): Payer: Self-pay | Admitting: *Deleted

## 2013-07-01 ENCOUNTER — Encounter (HOSPITAL_COMMUNITY): Payer: Medicaid Other

## 2013-07-02 ENCOUNTER — Encounter (HOSPITAL_COMMUNITY)
Admission: RE | Admit: 2013-07-02 | Discharge: 2013-07-02 | Disposition: A | Discharge status: Home / Self Care | Payer: Medicaid Other | Source: Ambulatory Visit | Attending: Nephrology | Admitting: Nephrology

## 2013-07-02 VITALS — BP 141/68 | HR 80 | Temp 98.2°F | Resp 20

## 2013-07-02 DIAGNOSIS — N186 End stage renal disease: Secondary | ICD-10-CM | POA: Insufficient documentation

## 2013-07-02 DIAGNOSIS — I120 Hypertensive chronic kidney disease with stage 5 chronic kidney disease or end stage renal disease: Secondary | ICD-10-CM | POA: Insufficient documentation

## 2013-07-02 DIAGNOSIS — D631 Anemia in chronic kidney disease: Secondary | ICD-10-CM | POA: Insufficient documentation

## 2013-07-02 MED ORDER — EPOETIN ALFA 20000 UNIT/ML IJ SOLN
20000.0000 [IU] | INTRAMUSCULAR | Status: DC
  Administered 2013-07-02: 20000 [IU] via SUBCUTANEOUS

## 2013-07-02 MED ORDER — EPOETIN ALFA 20000 UNIT/ML IJ SOLN
INTRAMUSCULAR | Status: AC
  Filled 2013-07-02: qty 1

## 2013-07-04 ENCOUNTER — Encounter: Payer: Self-pay | Admitting: Vascular Surgery

## 2013-07-04 ENCOUNTER — Ambulatory Visit (INDEPENDENT_AMBULATORY_CARE_PROVIDER_SITE_OTHER): Payer: Medicaid Other | Admitting: Vascular Surgery

## 2013-07-04 ENCOUNTER — Encounter (INDEPENDENT_AMBULATORY_CARE_PROVIDER_SITE_OTHER): Payer: Medicaid Other | Admitting: *Deleted

## 2013-07-04 VITALS — BP 151/71 | HR 88 | Ht 64.0 in | Wt 188.4 lb

## 2013-07-04 DIAGNOSIS — T82898A Other specified complication of vascular prosthetic devices, implants and grafts, initial encounter: Secondary | ICD-10-CM

## 2013-07-04 DIAGNOSIS — Z4931 Encounter for adequacy testing for hemodialysis: Secondary | ICD-10-CM

## 2013-07-04 DIAGNOSIS — N186 End stage renal disease: Secondary | ICD-10-CM

## 2013-07-04 DIAGNOSIS — T82598A Other mechanical complication of other cardiac and vascular devices and implants, initial encounter: Secondary | ICD-10-CM

## 2013-07-04 NOTE — Progress Notes (Signed)
Patient is a 57 year old female who previously had a right radiocephalic AV fistula placed several months ago. She subsequently had revision with ligation of side branches 04/17/2013. She is referred back by Dr. Arty Baumgartner to assess for maturation of the fistula. She is currently not hemodialysis. She denies any numbness or tingling in her right hand.  Review of systems: She denies shortness of breath. She denies chest pain.  Physical exam:  Filed Vitals:   07/04/13 0849  BP: 151/71  Pulse: 88  Height: 5\' 4"  (1.626 m)  Weight: 188 lb 6.4 oz (85.458 kg)  SpO2: 100%   Right upper extremity: Palpable thrill audible bruit in fistula  Data: Patient had a duplex ultrasound of her AV fistula today which are reviewed and interpreted. This showed that the fistula was greater than 6 mm in diameter throughout its entire course in the forearm. It is also less than 6 mm in depth from the skin throughout the entire forearm.  Assessment: Fistula seems to be mature and ready for use and needs criteria of diameter and depth from skin surface.  Plan: At this point I would attempt cannulating the fistula. If there is any difficulty cannulating the fistula. Most likely we will have to consider placing a new access as there is nothing further we could do to make the fistula improved from its current diameter and depth.  Ruta Hinds, MD [facility] Office: [phone] Pager: [phone]

## 2013-07-10 ENCOUNTER — Telehealth: Payer: Self-pay | Admitting: Pulmonary Disease

## 2013-07-10 NOTE — Telephone Encounter (Signed)
Pt called & states that she was contacted by Huey Romans today.  [facility] set up an appt for today @ 3:00 PM.  Nothing further needed at this time.  Satira Anis

## 2013-07-10 NOTE — Telephone Encounter (Signed)
Arbie Cookey contacted patient this morning and has scheduled pt to bring the device in today to have auto pressure set. Rhonda J Cobb

## 2013-07-10 NOTE — Telephone Encounter (Signed)
Spoke with Arbie Cookey at [place] and she has received the order. She will contact the patient now to arrange to change cpap pressure to auto mode per order on 06/18/13. Rhonda J Cobb

## 2013-07-16 ENCOUNTER — Encounter (HOSPITAL_COMMUNITY)
Admission: RE | Admit: 2013-07-16 | Discharge: 2013-07-16 | Disposition: A | Discharge status: Home / Self Care | Payer: Medicaid Other | Source: Ambulatory Visit | Attending: Nephrology | Admitting: Nephrology

## 2013-07-16 DIAGNOSIS — D631 Anemia in chronic kidney disease: Secondary | ICD-10-CM

## 2013-07-16 LAB — POCT HEMOGLOBIN-HEMACUE: Hemoglobin: 10.6 g/dL — ABNORMAL LOW (ref 12.0–15.0)

## 2013-07-16 MED ORDER — EPOETIN ALFA 20000 UNIT/ML IJ SOLN
INTRAMUSCULAR | Status: AC
  Administered 2013-07-16: 20000 [IU] via SUBCUTANEOUS
  Filled 2013-07-16: qty 1

## 2013-07-16 MED ORDER — EPOETIN ALFA 20000 UNIT/ML IJ SOLN: 20000.0000 [IU] | INTRAMUSCULAR | Status: DC

## 2013-07-30 ENCOUNTER — Encounter (HOSPITAL_COMMUNITY)
Admission: RE | Admit: 2013-07-30 | Discharge: 2013-07-30 | Disposition: A | Discharge status: Home / Self Care | Payer: Medicaid Other | Source: Ambulatory Visit | Attending: Nephrology | Admitting: Nephrology

## 2013-07-30 VITALS — BP 142/68 | HR 85 | Temp 98.6°F | Resp 20

## 2013-07-30 DIAGNOSIS — D631 Anemia in chronic kidney disease: Secondary | ICD-10-CM | POA: Insufficient documentation

## 2013-07-30 LAB — FERRITIN: Ferritin: 186 ng/mL (ref 10–291)

## 2013-07-30 LAB — IRON AND TIBC
Saturation Ratios: 21 % (ref 20–55)
TIBC: 219 ug/dL — ABNORMAL LOW (ref 250–470)
UIBC: 172 ug/dL (ref 125–400)

## 2013-07-30 LAB — POCT HEMOGLOBIN-HEMACUE: Hemoglobin: 10.5 g/dL — ABNORMAL LOW (ref 12.0–15.0)

## 2013-07-30 MED ORDER — EPOETIN ALFA 20000 UNIT/ML IJ SOLN
INTRAMUSCULAR | Status: AC
  Filled 2013-07-30: qty 1

## 2013-07-30 MED ORDER — EPOETIN ALFA 20000 UNIT/ML IJ SOLN
20000.0000 [IU] | INTRAMUSCULAR | Status: DC
  Administered 2013-07-30: 20000 [IU] via SUBCUTANEOUS

## 2013-08-12 ENCOUNTER — Other Ambulatory Visit (HOSPITAL_COMMUNITY): Payer: Self-pay | Admitting: *Deleted

## 2013-08-13 ENCOUNTER — Encounter (HOSPITAL_COMMUNITY)
Admission: RE | Admit: 2013-08-13 | Discharge: 2013-08-13 | Disposition: A | Discharge status: Home / Self Care | Payer: Medicaid Other | Source: Ambulatory Visit | Attending: Nephrology | Admitting: Nephrology

## 2013-08-13 VITALS — BP 136/62 | HR 88 | Temp 98.2°F | Resp 20

## 2013-08-13 DIAGNOSIS — D631 Anemia in chronic kidney disease: Secondary | ICD-10-CM

## 2013-08-13 LAB — COMPREHENSIVE METABOLIC PANEL
Albumin: 3.1 g/dL — ABNORMAL LOW (ref 3.5–5.2)
Alkaline Phosphatase: 127 U/L — ABNORMAL HIGH (ref 39–117)
BUN: 72 mg/dL — ABNORMAL HIGH (ref 6–23)
Calcium: 9.1 mg/dL (ref 8.4–10.5)
Creatinine, Ser: 4.76 mg/dL — ABNORMAL HIGH (ref 0.50–1.10)
GFR calc Af Amer: 11 mL/min — ABNORMAL LOW (ref >90)
GFR calc non Af Amer: 9 mL/min — ABNORMAL LOW (ref >90)
Glucose, Bld: 241 mg/dL — ABNORMAL HIGH (ref 70–99)
Total Protein: 7.3 g/dL (ref 6.0–8.3)

## 2013-08-13 LAB — HEPATITIS B SURFACE ANTIGEN: Hepatitis B Surface Ag: NEGATIVE

## 2013-08-13 LAB — POCT HEMOGLOBIN-HEMACUE: Hemoglobin: 10.5 g/dL — ABNORMAL LOW (ref 12.0–15.0)

## 2013-08-13 MED ORDER — EPOETIN ALFA 20000 UNIT/ML IJ SOLN
20000.0000 [IU] | INTRAMUSCULAR | Status: DC
  Administered 2013-08-13: 20000 [IU] via SUBCUTANEOUS

## 2013-08-13 MED ORDER — EPOETIN ALFA 20000 UNIT/ML IJ SOLN
INTRAMUSCULAR | Status: AC
  Filled 2013-08-13: qty 1

## 2013-08-14 LAB — PTH, INTACT AND CALCIUM: PTH: 467.8 pg/mL — ABNORMAL HIGH (ref 14.0–72.0)

## 2013-08-27 ENCOUNTER — Encounter (HOSPITAL_COMMUNITY)
Admission: RE | Admit: 2013-08-27 | Discharge: 2013-08-27 | Disposition: A | Discharge status: Home / Self Care | Payer: Medicaid Other | Source: Ambulatory Visit | Attending: Nephrology | Admitting: Nephrology

## 2013-08-27 VITALS — BP 114/74 | HR 92 | Temp 98.3°F | Resp 20

## 2013-08-27 DIAGNOSIS — D631 Anemia in chronic kidney disease: Secondary | ICD-10-CM | POA: Insufficient documentation

## 2013-08-27 LAB — POCT HEMOGLOBIN-HEMACUE: Hemoglobin: 10.7 g/dL — ABNORMAL LOW (ref 12.0–15.0)

## 2013-08-27 MED ORDER — EPOETIN ALFA 20000 UNIT/ML IJ SOLN
20000.0000 [IU] | INTRAMUSCULAR | Status: DC
  Administered 2013-08-27: 20000 [IU] via SUBCUTANEOUS

## 2013-08-27 MED ORDER — EPOETIN ALFA 20000 UNIT/ML IJ SOLN
INTRAMUSCULAR | Status: AC
  Administered 2013-08-27: 20000 [IU] via SUBCUTANEOUS
  Filled 2013-08-27: qty 1

## 2013-09-10 ENCOUNTER — Encounter (HOSPITAL_COMMUNITY)
Admission: RE | Admit: 2013-09-10 | Discharge: 2013-09-10 | Disposition: A | Discharge status: Home / Self Care | Payer: Medicaid Other | Source: Ambulatory Visit | Attending: Nephrology | Admitting: Nephrology

## 2013-09-10 VITALS — BP 146/73 | HR 91 | Temp 97.6°F | Resp 20

## 2013-09-10 DIAGNOSIS — D631 Anemia in chronic kidney disease: Secondary | ICD-10-CM

## 2013-09-10 LAB — COMPREHENSIVE METABOLIC PANEL
ALT: 12 U/L (ref 0–35)
Albumin: 3.5 g/dL (ref 3.5–5.2)
Alkaline Phosphatase: 177 U/L — ABNORMAL HIGH (ref 39–117)
BUN: 71 mg/dL — ABNORMAL HIGH (ref 6–23)
Calcium: 9.3 mg/dL (ref 8.4–10.5)
Chloride: 99 mEq/L (ref 96–112)
Potassium: 5.3 mEq/L — ABNORMAL HIGH (ref 3.5–5.1)
Total Bilirubin: 0.3 mg/dL (ref 0.3–1.2)
Total Protein: 7.7 g/dL (ref 6.0–8.3)

## 2013-09-10 LAB — POCT HEMOGLOBIN-HEMACUE: Hemoglobin: 11.7 g/dL — ABNORMAL LOW (ref 12.0–15.0)

## 2013-09-10 MED ORDER — EPOETIN ALFA 20000 UNIT/ML IJ SOLN
20000.0000 [IU] | INTRAMUSCULAR | Status: DC
  Administered 2013-09-10: 20000 [IU] via SUBCUTANEOUS

## 2013-09-10 MED ORDER — EPOETIN ALFA 20000 UNIT/ML IJ SOLN
INTRAMUSCULAR | Status: AC
  Administered 2013-09-10: 20000 [IU] via SUBCUTANEOUS
  Filled 2013-09-10: qty 1

## 2013-09-21 ENCOUNTER — Other Ambulatory Visit: Payer: Self-pay | Admitting: Pulmonary Disease

## 2013-09-21 DIAGNOSIS — G4733 Obstructive sleep apnea (adult) (pediatric): Secondary | ICD-10-CM

## 2013-09-24 ENCOUNTER — Encounter (HOSPITAL_COMMUNITY)
Admission: RE | Admit: 2013-09-24 | Discharge: 2013-09-24 | Disposition: A | Discharge status: Home / Self Care | Payer: Medicaid Other | Source: Ambulatory Visit | Attending: Nephrology | Admitting: Nephrology

## 2013-09-24 VITALS — BP 147/79 | HR 88 | Temp 98.4°F | Resp 20

## 2013-09-24 DIAGNOSIS — D631 Anemia in chronic kidney disease: Secondary | ICD-10-CM | POA: Insufficient documentation

## 2013-09-24 LAB — IRON AND TIBC
TIBC: 261 ug/dL (ref 250–470)
UIBC: 214 ug/dL (ref 125–400)

## 2013-09-24 LAB — POCT HEMOGLOBIN-HEMACUE: Hemoglobin: 12.1 g/dL (ref 12.0–15.0)

## 2013-09-24 MED ORDER — EPOETIN ALFA 20000 UNIT/ML IJ SOLN: 20000.0000 [IU] | INTRAMUSCULAR | Status: DC

## 2013-09-25 LAB — FERRITIN: Ferritin: 151 ng/mL (ref 10–291)

## 2013-10-07 ENCOUNTER — Other Ambulatory Visit: Payer: Self-pay | Admitting: *Deleted

## 2013-10-07 DIAGNOSIS — Z01810 Encounter for preprocedural cardiovascular examination: Secondary | ICD-10-CM

## 2013-10-07 DIAGNOSIS — N184 Chronic kidney disease, stage 4 (severe): Secondary | ICD-10-CM

## 2013-10-08 ENCOUNTER — Encounter (HOSPITAL_COMMUNITY)
Admission: RE | Admit: 2013-10-08 | Discharge: 2013-10-08 | Disposition: A | Discharge status: Home / Self Care | Payer: Medicaid Other | Source: Ambulatory Visit | Attending: Nephrology | Admitting: Nephrology

## 2013-10-08 VITALS — BP 152/75 | HR 84 | Temp 98.3°F | Resp 18

## 2013-10-08 DIAGNOSIS — D631 Anemia in chronic kidney disease: Secondary | ICD-10-CM

## 2013-10-08 LAB — POCT HEMOGLOBIN-HEMACUE: Hemoglobin: 11.9 g/dL — ABNORMAL LOW (ref 12.0–15.0)

## 2013-10-08 LAB — COMPREHENSIVE METABOLIC PANEL
ALT: 13 U/L (ref 0–35)
AST: 12 U/L (ref 0–37)
Albumin: 3.4 g/dL — ABNORMAL LOW (ref 3.5–5.2)
Alkaline Phosphatase: 171 U/L — ABNORMAL HIGH (ref 39–117)
BUN: 73 mg/dL — ABNORMAL HIGH (ref 6–23)
GFR calc Af Amer: 10 mL/min — ABNORMAL LOW (ref >90)
Glucose, Bld: 190 mg/dL — ABNORMAL HIGH (ref 70–99)
Potassium: 5 mEq/L (ref 3.5–5.1)
Sodium: 135 mEq/L (ref 135–145)
Total Bilirubin: 0.3 mg/dL (ref 0.3–1.2)
Total Protein: 7.9 g/dL (ref 6.0–8.3)

## 2013-10-08 MED ORDER — EPOETIN ALFA 20000 UNIT/ML IJ SOLN
20000.0000 [IU] | INTRAMUSCULAR | Status: DC
  Administered 2013-10-08: 14:00:00 20000 [IU] via SUBCUTANEOUS

## 2013-10-08 MED ORDER — EPOETIN ALFA 20000 UNIT/ML IJ SOLN
INTRAMUSCULAR | Status: AC
  Filled 2013-10-08: qty 1

## 2013-10-22 ENCOUNTER — Encounter (HOSPITAL_COMMUNITY)
Admission: RE | Admit: 2013-10-22 | Discharge: 2013-10-22 | Disposition: A | Discharge status: Home / Self Care | Payer: Medicaid Other | Source: Ambulatory Visit | Attending: Nephrology | Admitting: Nephrology

## 2013-10-22 VITALS — BP 172/80 | HR 80 | Temp 98.2°F

## 2013-10-22 DIAGNOSIS — D631 Anemia in chronic kidney disease: Secondary | ICD-10-CM

## 2013-10-22 DIAGNOSIS — N039 Chronic nephritic syndrome with unspecified morphologic changes: Principal | ICD-10-CM

## 2013-10-22 LAB — POCT HEMOGLOBIN-HEMACUE: Hemoglobin: 12.3 g/dL (ref 12.0–15.0)

## 2013-10-22 MED ORDER — EPOETIN ALFA 20000 UNIT/ML IJ SOLN: 20000.0000 [IU] | INTRAMUSCULAR | Status: DC

## 2013-10-23 LAB — PTH, INTACT AND CALCIUM
Calcium, Total (PTH): 9 mg/dL (ref 8.4–10.5)
PTH: 514.6 pg/mL — ABNORMAL HIGH (ref 14.0–72.0)

## 2013-10-28 ENCOUNTER — Encounter: Payer: Self-pay | Admitting: Vascular Surgery

## 2013-10-29 ENCOUNTER — Ambulatory Visit (HOSPITAL_COMMUNITY)
Admission: RE | Admit: 2013-10-29 | Discharge: 2013-10-29 | Disposition: A | Discharge status: Home / Self Care | Payer: Medicaid Other | Source: Ambulatory Visit | Attending: Vascular Surgery | Admitting: Vascular Surgery

## 2013-10-29 ENCOUNTER — Encounter: Payer: Self-pay | Admitting: *Deleted

## 2013-10-29 ENCOUNTER — Ambulatory Visit (INDEPENDENT_AMBULATORY_CARE_PROVIDER_SITE_OTHER): Payer: Medicaid Other | Admitting: Vascular Surgery

## 2013-10-29 VITALS — BP 124/74 | HR 94 | Resp 16 | Ht 64.0 in | Wt 190.0 lb

## 2013-10-29 DIAGNOSIS — Z01810 Encounter for preprocedural cardiovascular examination: Secondary | ICD-10-CM | POA: Insufficient documentation

## 2013-10-29 DIAGNOSIS — N184 Chronic kidney disease, stage 4 (severe): Secondary | ICD-10-CM | POA: Insufficient documentation

## 2013-10-29 DIAGNOSIS — N186 End stage renal disease: Secondary | ICD-10-CM

## 2013-10-29 NOTE — Progress Notes (Signed)
Subjective:     Patient ID: [name], female   DOB: [birth year], 57 y.o.   MRN: [id number]  HPI this 58 year old female was referred to further evaluate her right upper arm radial to cephalic AV fistula created by Dr. Oneida Alar. The fistula was most recently revised by Dr. Oneida Alar in July of 2014. Side branches had been ligated and it has been moved to a more superficial location. Patient has not yet been on hemodialysis so there has been no attempt to utilize the fistula. She denies any pain or numbness in the right hand.  Past Medical History  Diagnosis Date  . Hypertension   . Hyperlipidemia   . Diabetes mellitus     diagnosed in 1988, on insulin.  . Anemia     2/2 renal failure  . Diabetic retinopathy     s/p PRP OS  . History of UTI   . Depression   . History of TIAs     10/08- twisted face and slurred speech without extremity weakness  . History of MI (myocardial infarction)   . Bleeding     History of retinal bleed on full dose aspirin  . S/P left heart catheterization by cutdown     July 2003, normal coronary arteries.  . Arthritis     ? Hands  . Chronic kidney disease     stage 3, 2/2 DM and HTN.-not on diaylsis yet  . CHF (congestive heart failure)   . Myocardial infarction     per patient about 5 yrs ago  . Palpitations   . Chest pain   . Hyperparathyroidism   . Thyroid disease   . Shortness of breath     exersion  . Pneumonia     hx  . Obstructive sleep apnea     to have new sleep study 04/25/13 does not use cpap  . Stroke     tia hx  . Heart murmur     History  Substance Use Topics  . Smoking status: Former Smoker -- 1.00 packs/day for 10 years    Types: Cigarettes    Quit date: 01/12/1983  . Smokeless tobacco: Never Used  . Alcohol Use: No    Family History  Problem Relation Age of Onset  . Cancer Mother     lung smoker  . Diabetes Mother   . Hypertension Mother   . Heart disease Mother     CHF, died at 74  . Hyperlipidemia Mother   . Heart  attack Mother   . Diabetes Father   . Hypertension Father   . Heart disease Father     CHF, died at 81  . Hyperlipidemia Father   . Heart attack Father   . Cancer Sister     breast cancer  . Diabetes Son   . Heart disease Son   . Hypertension Son   . Hypertension Maternal Grandmother   . Hypertension Paternal Grandmother   . Colon cancer Neg Hx   . Anesthesia problems Neg Hx     Allergies  Allergen Reactions  . Codeine Nausea And Vomiting  . Hydrocodone Other (See Comments)    hallucinations    Current outpatient prescriptions:amLODipine (NORVASC) 10 MG tablet, Take 10 mg by mouth daily.  , Disp: , Rfl: ;  calcitRIOL (ROCALTROL) 0.25 MCG capsule, Take 0.25 mcg by mouth Daily., Disp: , Rfl: ;  carvedilol (COREG) 25 MG tablet, Take 25 mg by mouth 2 (two) times daily with a meal.  , Disp: ,  Rfl: ;  doxazosin (CARDURA) 1 MG tablet, Take 1 mg by mouth at bedtime.  , Disp: , Rfl:  epoetin alfa (PROCRIT) 29562 UNIT/ML injection, Inject 20,000 Units into the skin every 14 (fourteen) days. Next dose is on 17th of June., Disp: , Rfl: ;  FeFum-FePoly-FA-B Cmp-C-Biot (INTEGRA PLUS) CAPS, Take 1 capsule by mouth 3 (three) times a week. On Monday, Wednesday, and Friday, Disp: , Rfl: ;  furosemide (LASIX) 40 MG tablet, Take 40 mg by mouth 2 (two) times daily. , Disp: , Rfl:  insulin aspart protamine-insulin aspart (NOVOLOG 70/30) (70-30) 100 UNIT/ML injection, Inject into the skin 3 (three) times daily - between meals and at bedtime. Take 17 units at breakfast, 17 units at lunch and 34 units at bedtime, Disp: , Rfl: ;  insulin glargine (LANTUS) 100 UNIT/ML injection, Inject 40 Units into the skin at bedtime. , Disp: , Rfl:  oxyCODONE (ROXICODONE) 5 MG immediate release tablet, Take 1 tablet (5 mg total) by mouth every 6 (six) hours as needed for pain., Disp: 30 tablet, Rfl: 0;  sevelamer carbonate (RENVELA) 800 MG tablet, Take 800 mg by mouth 3 (three) times daily with meals., Disp: , Rfl:   BP 124/74   Pulse 94  Resp 16  Ht 5\' 4"  (1.626 m)  Wt 190 lb (86.183 kg)  BMI 32.60 kg/m2  Body mass index is 32.6 kg/(m^2).           Review of Systems     Objective:   Physical Exam BP 124/74  Pulse 94  Resp 16  Ht 5\' 4"  (1.626 m)  Wt 190 lb (86.183 kg)  BMI 32.60 kg/m2  General well-developed well-nourished female no apparent stress alert and oriented x3 Right upper extremity with radial to cephalic A-V fistula with excellent pulse and palpable thrill. Right hand is adequately perfused.  Today I ordered a ultrasound study of the right arm AV fistula which reveals the fistula to be widely patent. There is retrograde flow in the right radial artery distal to the anastomosis with good flow through the ulnar artery. The fistula varies between 0.64 and 0.55 cm in the forearm in diameter.     Assessment:     Excellent appearing right radial to cephalic A-V fistula-has not been utilized today because patient has not been on hemodialysis-side branches had been ligated    Plan:     Okay to utilize fistula at any time-appears to be a very good fistula which should function well

## 2013-10-30 ENCOUNTER — Encounter: Payer: Self-pay | Admitting: Neurology

## 2013-10-30 ENCOUNTER — Ambulatory Visit (INDEPENDENT_AMBULATORY_CARE_PROVIDER_SITE_OTHER): Payer: Medicaid Other | Admitting: Neurology

## 2013-10-30 VITALS — BP 156/82 | HR 97 | Ht 64.0 in | Wt 191.0 lb

## 2013-10-30 DIAGNOSIS — R4182 Altered mental status, unspecified: Secondary | ICD-10-CM

## 2013-10-30 DIAGNOSIS — G459 Transient cerebral ischemic attack, unspecified: Secondary | ICD-10-CM

## 2013-10-30 NOTE — Progress Notes (Signed)
[facility]    Provider:  Dr Janann Colonel Referring Provider: Donetta Potts, MD Primary Care Physician:  Donetta Potts, MD  CC:  ? TIA  HPI:  [name] is a 58 y.o. female here as a referral from Dr. Marval Regal for evaluation of possible TIA  Per records, noted to have multiple episodes over the past 3 months which consisted of difficulty speaking clearly, mouth twisting to the left and tearing up of left eye. Patient has history of HTN, DM, hyperlipidemia, CKD. She notes episodes where she gets very confused, doesn't remember where she is, can occur when driving. During these episodes notes some twitching of her eyelids. She also notes episodes where she will spontaneously scream, feels the words just come out very loudly, she can't control it, other people can't understand her during these episodes. No focal motor or sensory change during these events. Feels that her balance is off, notes some light headed sensation upon standing.  No episodes of LOC.   Reports having had a TIA in the past.   Not currently taking ASA, was on it in the past. Told it was stopped by her eye doctor (reason unclear).   Review of Systems: Out of a complete 14 system review, the patient complains of only the following symptoms, and all other reviewed systems are negative. Other blurred vision eye pain confusion headache slurred speech dizziness feeling cold depression change in appetite  History   Social History  . Marital Status: Married    Spouse Name: Mateo Flow    Number of Children: 2  . 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 Smoking status: Former Smoker -- 1.00 packs/day for 10 years    Types: Cigarettes    Quit date: 01/12/1983  . Smokeless tobacco: Never Used  . Alcohol Use: No  . Drug Use: No  . Sexual Activity: Not on file   Other Topics Concern  . Not on file   Social History Narrative   Recently  unemployed from the health department, and she is actively looking for work. She lives with husband( monogamous sexual partner) and 2 grandsons. She has 2 adult children. Patient is receiving unemployment at the moment; her husband is unemployed.    Family History  Problem Relation Age of Onset  . Cancer Mother     lung smoker  . Diabetes Mother   . Hypertension Mother   . Heart disease Mother     CHF, died at 44  . Hyperlipidemia Mother   . Heart attack Mother   . Diabetes Father   . Hypertension Father   . Heart disease Father     CHF, died at 59  . Hyperlipidemia Father   . Heart attack Father   . Cancer Sister     breast cancer  . Diabetes Son   . Heart disease Son   . Hypertension Son   . Hypertension Maternal Grandmother   . Hypertension Paternal Grandmother   . Colon cancer Neg Hx   . Anesthesia problems Neg Hx     Past Medical History  Diagnosis Date  . Hypertension   . Hyperlipidemia   . Diabetes mellitus     diagnosed in 1988, on insulin.  . Anemia     2/2 renal failure  . Diabetic retinopathy     s/p PRP OS  . History of UTI   . Depression   . History of TIAs  10/08- twisted face and slurred speech without extremity weakness  . History of MI (myocardial infarction)   . Bleeding     History of retinal bleed on full dose aspirin  . S/P left heart catheterization by cutdown     July 2003, normal coronary arteries.  . Arthritis     ? Hands  . Chronic kidney disease     stage 3, 2/2 DM and HTN.-not on diaylsis yet  . CHF (congestive heart failure)   . Myocardial infarction     per patient about 5 yrs ago  . Palpitations   . Chest pain   . Hyperparathyroidism   . Thyroid disease   . Shortness of breath     exersion  . Pneumonia     hx  . Obstructive sleep apnea     to have new sleep study 04/25/13 does not use cpap  . Stroke     tia hx  . Heart murmur     Past Surgical History  Procedure Laterality Date  . Hernia repair    .  Cholecystectomy    . Abdominal hysterectomy    . Umbical hernia    . Eye surgery      Laser DM   . Av fistula placement  12/05/2011    Procedure: ARTERIOVENOUS (AV) FISTULA CREATION;  Surgeon: Elam Dutch, MD;  Location: [facility] OR;  Service: Vascular;  Laterality: Right;  Creation of Arteriovenous fistula right lower arm   . Revison of arteriovenous fistula Right 04/17/2013    Procedure: REVISON OF ARTERIOVENOUS FISTULA and superficialization;  Surgeon: Elam Dutch, MD;  Location: [facility] OR;  Service: Vascular;  Laterality: Right;    Current Outpatient Prescriptions  Medication Sig Dispense Refill  . amLODipine (NORVASC) 10 MG tablet Take 10 mg by mouth daily.        . calcitRIOL (ROCALTROL) 0.25 MCG capsule Take 0.25 mcg by mouth Daily.      . carvedilol (COREG) 25 MG tablet Take 25 mg by mouth 2 (two) times daily with a meal.        . doxazosin (CARDURA) 1 MG tablet Take 1 mg by mouth at bedtime.        Marland Kitchen epoetin alfa (PROCRIT) 09811 UNIT/ML injection Inject 20,000 Units into the skin every 14 (fourteen) days. Next dose is on 17th of June.      . FeFum-FePoly-FA-B Cmp-C-Biot (INTEGRA PLUS) CAPS Take 1 capsule by mouth 3 (three) times a week. On Monday, Wednesday, and Friday      . furosemide (LASIX) 40 MG tablet Take 40 mg by mouth 2 (two) times daily.       . insulin aspart protamine-insulin aspart (NOVOLOG 70/30) (70-30) 100 UNIT/ML injection Inject into the skin 3 (three) times daily - between meals and at bedtime. Take 17 units at breakfast, 17 units at lunch and 34 units at bedtime      . oxyCODONE (ROXICODONE) 5 MG immediate release tablet Take 1 tablet (5 mg total) by mouth every 6 (six) hours as needed for pain.  30 tablet  0  . sevelamer carbonate (RENVELA) 800 MG tablet Take 800 mg by mouth 3 (three) times daily with meals.       No current facility-administered medications for this visit.    Allergies as of 10/30/2013 - Review Complete 10/30/2013  Allergen Reaction Noted  .  Codeine Nausea And Vomiting 05/27/2010  . Hydrocodone Other (See Comments) 01/05/2012    Vitals: BP 156/82  Pulse 97  Ht 5\' 4"  (1.626 m)  Wt 191 lb (86.637 kg)  BMI 32.77 kg/m2 Last Weight:  Wt Readings from Last 1 Encounters:  10/30/13 191 lb (86.637 kg)   Last Height:   Ht Readings from Last 1 Encounters:  10/30/13 5\' 4"  (1.626 m)     Physical exam: Exam: Gen: NAD, conversant Eyes: anicteric sclerae, moist conjunctivae HENT: Atraumatic, oropharynx clear Neck: Trachea midline; supple,  Lungs: CTA, no wheezing, rales, rhonic                          CV: RRR, no MRG Abdomen: Soft, non-tender;  Extremities: No peripheral edema  Skin: Normal temperature, no rash,  Psych: Appropriate affect, pleasant  Neuro: MS: AA&Ox3, appropriately interactive, normal affect   Speech: fluent w/o paraphasic error  Memory: good recent and remote recall  CN: PERRL, EOMI no nystagmus, no ptosis, sensation intact to LT V1-V3 bilat, face symmetric, no weakness, hearing grossly intact, palate elevates symmetrically, shoulder shrug 5/5 bilat,  tongue protrudes midline, no fasiculations noted.  Motor: normal bulk and tone Strength: 5/5  In all extremities  Coord: rapid alternating and point-to-point (FNF, HTS) movements intact.  Reflexes: symmetrical, bilat downgoing toes  Sens: LT intact in all extremities  Gait: posture, stance, stride and arm-swing normal. Tandem gait intact. Able to walk on heels and toes. Romberg absent.   Assessment:  After physical and neurologic examination, review of laboratory studies, imaging, neurophysiology testing and pre-existing records, assessment will be reviewed on the problem list.  Plan:  Treatment plan and additional workup will be reviewed under Problem List.  1)TIA 2)AMS  58y/o woman presenting for initial evaluation of recurrent transient episodes of speech difficulties, facial pulling, confusion. Patient has multiple risk factors,  including HTN, DM, hyperlipidemia. Differential includes TIA vs seizure. Will do stroke/TIA workup, check EEG. Counseled patient to follow up with eye doctor, if ok from their standpoint then would start ASA 81mg  daily. Follow up once workup completed.   Jim Like, DO  [facility] [address] [place], [place] [address]  Phone [phone] Fax [phone]

## 2013-10-30 NOTE — Patient Instructions (Signed)
Overall you are doing fairly well but I do want to suggest a few things today:   Remember to drink plenty of fluid, eat healthy meals and do not skip any meals. Try to eat protein with a every meal and eat a healthy snack such as fruit or nuts in between meals. Try to keep a regular sleep-wake schedule and try to exercise daily, particularly in the form of walking, 20-30 minutes a day, if you can.   As far as your medications are concerned, I would like to suggest the following: 1)Please follow up with your eye doctor to see if they are ok with you restarting a daily aspirin. If they are, please take 81mg  daily  As far as diagnostic testing:  1)Please schedule an EEG and carotid ultrasound 2)We ordered a brain MRI. We will call you to schedule this.   I would like to see you back in 3 months, sooner if we need to. Please call us with any interim questions, concerns, problems, updates or refill requests.   Please also call us for any test results so we can go over those with you on the phone.  My clinical assistant and will answer any of your questions and relay your messages to me and also relay most of my messages to you.   Our phone number is [phone]. We also have an after hours call service for urgent matters and there is a physician on-call for urgent questions. For any emergencies you know to call 911 or go to the nearest emergency room

## 2013-11-05 ENCOUNTER — Encounter (HOSPITAL_COMMUNITY)
Admission: RE | Admit: 2013-11-05 | Discharge: 2013-11-05 | Disposition: A | Discharge status: Home / Self Care | Payer: Medicaid Other | Source: Ambulatory Visit | Attending: Nephrology | Admitting: Nephrology

## 2013-11-05 ENCOUNTER — Other Ambulatory Visit (HOSPITAL_COMMUNITY): Payer: Self-pay | Admitting: *Deleted

## 2013-11-05 DIAGNOSIS — N039 Chronic nephritic syndrome with unspecified morphologic changes: Principal | ICD-10-CM

## 2013-11-05 DIAGNOSIS — D631 Anemia in chronic kidney disease: Secondary | ICD-10-CM

## 2013-11-05 LAB — COMPREHENSIVE METABOLIC PANEL
ALT: 10 U/L (ref 0–35)
AST: 9 U/L (ref 0–37)
Albumin: 3 g/dL — ABNORMAL LOW (ref 3.5–5.2)
Alkaline Phosphatase: 166 U/L — ABNORMAL HIGH (ref 39–117)
BILIRUBIN TOTAL: 0.2 mg/dL — AB (ref 0.3–1.2)
BUN: 79 mg/dL — ABNORMAL HIGH (ref 6–23)
CALCIUM: 8.6 mg/dL (ref 8.4–10.5)
CHLORIDE: 100 meq/L (ref 96–112)
CO2: 18 meq/L — AB (ref 19–32)
Creatinine, Ser: 5.46 mg/dL — ABNORMAL HIGH (ref 0.50–1.10)
GFR, EST AFRICAN AMERICAN: 9 mL/min — AB (ref >90)
GFR, EST NON AFRICAN AMERICAN: 8 mL/min — AB (ref >90)
GLUCOSE: 283 mg/dL — AB (ref 70–99)
Potassium: 4.9 mEq/L (ref 3.7–5.3)
Sodium: 135 mEq/L — ABNORMAL LOW (ref 137–147)
Total Protein: 7.4 g/dL (ref 6.0–8.3)

## 2013-11-05 LAB — POCT HEMOGLOBIN-HEMACUE: Hemoglobin: 10.7 g/dL — ABNORMAL LOW (ref 12.0–15.0)

## 2013-11-05 MED ORDER — EPOETIN ALFA 20000 UNIT/ML IJ SOLN
20000.0000 [IU] | INTRAMUSCULAR | Status: DC
Start: 2013-11-05 — End: 2013-11-06
  Administered 2013-11-05: 20000 [IU] via SUBCUTANEOUS

## 2013-11-05 MED ORDER — EPOETIN ALFA 20000 UNIT/ML IJ SOLN
INTRAMUSCULAR | Status: AC
  Administered 2013-11-05: 14:00:00 20000 [IU] via SUBCUTANEOUS
  Filled 2013-11-05: qty 1

## 2013-11-07 ENCOUNTER — Telehealth: Payer: Self-pay | Admitting: Neurology

## 2013-11-07 ENCOUNTER — Ambulatory Visit (INDEPENDENT_AMBULATORY_CARE_PROVIDER_SITE_OTHER): Payer: Medicaid Other | Admitting: Radiology

## 2013-11-07 DIAGNOSIS — R4182 Altered mental status, unspecified: Secondary | ICD-10-CM

## 2013-11-07 DIAGNOSIS — G459 Transient cerebral ischemic attack, unspecified: Secondary | ICD-10-CM

## 2013-11-07 NOTE — Telephone Encounter (Signed)
Pt needs Xanax for upcoming MRI please call

## 2013-11-08 ENCOUNTER — Other Ambulatory Visit: Payer: Self-pay | Admitting: Neurology

## 2013-11-08 MED ORDER — ALPRAZOLAM 0.25 MG PO TABS: 0.2500 mg | ORAL_TABLET | ORAL | Status: DC | PRN

## 2013-11-08 NOTE — Telephone Encounter (Signed)
I called patient.  Advised Rx has been sent and she will need someone to drive her, as it is a sedating medication.  Patient verbalized understanding.

## 2013-11-08 NOTE — Telephone Encounter (Signed)
Xanax has been ordered. Please make sure she has someone to drive her home after the test if she is going to take xanax. Thanks.

## 2013-11-11 ENCOUNTER — Telehealth: Payer: Self-pay | Admitting: Neurology

## 2013-11-11 NOTE — Telephone Encounter (Signed)
I called [facility].  Spoke with the pharmacist.  She does not see any issues with the med.  They will have it ready for pick up today.  I called the patient back.  She is aware and will follow up with the pharmacy.

## 2013-11-11 NOTE — Telephone Encounter (Signed)
Patient called to state that the [facility]ative [facility] is back[facility] and none of the [facility] pharmac[facility] it. Patient's MRI is tomorrow at 1:30.

## 2013-11-12 ENCOUNTER — Ambulatory Visit
Admission: RE | Admit: 2013-11-12 | Discharge: 2013-11-12 | Disposition: A | Discharge status: Home / Self Care | Payer: Medicaid Other | Source: Ambulatory Visit | Attending: Neurology | Admitting: Neurology

## 2013-11-12 ENCOUNTER — Other Ambulatory Visit: Payer: Medicaid Other

## 2013-11-12 DIAGNOSIS — R4182 Altered mental status, unspecified: Secondary | ICD-10-CM

## 2013-11-12 DIAGNOSIS — G459 Transient cerebral ischemic attack, unspecified: Secondary | ICD-10-CM

## 2013-11-15 NOTE — Progress Notes (Signed)
Quick Note:  Shared normal MR Brain results with patient, she verbalized understanding ______

## 2013-11-19 ENCOUNTER — Encounter (HOSPITAL_COMMUNITY)
Admission: RE | Admit: 2013-11-19 | Discharge: 2013-11-19 | Disposition: A | Discharge status: Home / Self Care | Payer: Medicaid Other | Source: Ambulatory Visit | Attending: Nephrology | Admitting: Nephrology

## 2013-11-19 VITALS — BP 135/78 | HR 84 | Temp 98.7°F | Resp 18

## 2013-11-19 DIAGNOSIS — D631 Anemia in chronic kidney disease: Secondary | ICD-10-CM

## 2013-11-19 DIAGNOSIS — N039 Chronic nephritic syndrome with unspecified morphologic changes: Principal | ICD-10-CM

## 2013-11-19 LAB — IRON AND TIBC
Iron: 51 ug/dL (ref 42–135)
Saturation Ratios: 22 % (ref 20–55)
TIBC: 231 ug/dL — AB (ref 250–470)
UIBC: 180 ug/dL (ref 125–400)

## 2013-11-19 LAB — FERRITIN: FERRITIN: 238 ng/mL (ref 10–291)

## 2013-11-19 LAB — POCT HEMOGLOBIN-HEMACUE: Hemoglobin: 11.1 g/dL — ABNORMAL LOW (ref 12.0–15.0)

## 2013-11-19 MED ORDER — EPOETIN ALFA 20000 UNIT/ML IJ SOLN
INTRAMUSCULAR | Status: AC
  Filled 2013-11-19: qty 1

## 2013-11-19 MED ORDER — EPOETIN ALFA 20000 UNIT/ML IJ SOLN
20000.0000 [IU] | INTRAMUSCULAR | Status: DC
Start: 2013-11-19 — End: 2013-11-20
  Administered 2013-11-19: 10:00:00 20000 [IU] via SUBCUTANEOUS

## 2013-11-20 ENCOUNTER — Ambulatory Visit (INDEPENDENT_AMBULATORY_CARE_PROVIDER_SITE_OTHER): Payer: Medicaid Other

## 2013-11-20 DIAGNOSIS — G459 Transient cerebral ischemic attack, unspecified: Secondary | ICD-10-CM

## 2013-11-20 DIAGNOSIS — R4182 Altered mental status, unspecified: Secondary | ICD-10-CM

## 2013-11-24 ENCOUNTER — Encounter (HOSPITAL_COMMUNITY): Payer: Self-pay | Admitting: Emergency Medicine

## 2013-11-24 ENCOUNTER — Emergency Department (HOSPITAL_COMMUNITY): Payer: Medicaid Other

## 2013-11-24 ENCOUNTER — Emergency Department (HOSPITAL_COMMUNITY)
Admission: EM | Admit: 2013-11-24 | Discharge: 2013-11-24 | Disposition: A | Discharge status: Home / Self Care | Payer: Medicaid Other | Attending: Emergency Medicine | Admitting: Emergency Medicine

## 2013-11-24 DIAGNOSIS — M19049 Primary osteoarthritis, unspecified hand: Secondary | ICD-10-CM | POA: Insufficient documentation

## 2013-11-24 DIAGNOSIS — Y9301 Activity, walking, marching and hiking: Secondary | ICD-10-CM | POA: Insufficient documentation

## 2013-11-24 DIAGNOSIS — Z79899 Other long term (current) drug therapy: Secondary | ICD-10-CM | POA: Insufficient documentation

## 2013-11-24 DIAGNOSIS — Z87891 Personal history of nicotine dependence: Secondary | ICD-10-CM | POA: Insufficient documentation

## 2013-11-24 DIAGNOSIS — I129 Hypertensive chronic kidney disease with stage 1 through stage 4 chronic kidney disease, or unspecified chronic kidney disease: Secondary | ICD-10-CM | POA: Insufficient documentation

## 2013-11-24 DIAGNOSIS — S0990XA Unspecified injury of head, initial encounter: Secondary | ICD-10-CM | POA: Insufficient documentation

## 2013-11-24 DIAGNOSIS — Z9889 Other specified postprocedural states: Secondary | ICD-10-CM | POA: Insufficient documentation

## 2013-11-24 DIAGNOSIS — R209 Unspecified disturbances of skin sensation: Secondary | ICD-10-CM | POA: Insufficient documentation

## 2013-11-24 DIAGNOSIS — N189 Chronic kidney disease, unspecified: Secondary | ICD-10-CM | POA: Insufficient documentation

## 2013-11-24 DIAGNOSIS — W1809XA Striking against other object with subsequent fall, initial encounter: Secondary | ICD-10-CM | POA: Insufficient documentation

## 2013-11-24 DIAGNOSIS — Z862 Personal history of diseases of the blood and blood-forming organs and certain disorders involving the immune mechanism: Secondary | ICD-10-CM | POA: Insufficient documentation

## 2013-11-24 DIAGNOSIS — R079 Chest pain, unspecified: Secondary | ICD-10-CM | POA: Insufficient documentation

## 2013-11-24 DIAGNOSIS — W19XXXA Unspecified fall, initial encounter: Secondary | ICD-10-CM

## 2013-11-24 DIAGNOSIS — Y9289 Other specified places as the place of occurrence of the external cause: Secondary | ICD-10-CM | POA: Insufficient documentation

## 2013-11-24 DIAGNOSIS — Z8673 Personal history of transient ischemic attack (TIA), and cerebral infarction without residual deficits: Secondary | ICD-10-CM | POA: Insufficient documentation

## 2013-11-24 DIAGNOSIS — R110 Nausea: Secondary | ICD-10-CM | POA: Insufficient documentation

## 2013-11-24 DIAGNOSIS — E1139 Type 2 diabetes mellitus with other diabetic ophthalmic complication: Secondary | ICD-10-CM | POA: Insufficient documentation

## 2013-11-24 DIAGNOSIS — E11319 Type 2 diabetes mellitus with unspecified diabetic retinopathy without macular edema: Secondary | ICD-10-CM | POA: Insufficient documentation

## 2013-11-24 DIAGNOSIS — Z7982 Long term (current) use of aspirin: Secondary | ICD-10-CM | POA: Insufficient documentation

## 2013-11-24 DIAGNOSIS — Z8701 Personal history of pneumonia (recurrent): Secondary | ICD-10-CM | POA: Insufficient documentation

## 2013-11-24 DIAGNOSIS — I509 Heart failure, unspecified: Secondary | ICD-10-CM | POA: Insufficient documentation

## 2013-11-24 DIAGNOSIS — Z794 Long term (current) use of insulin: Secondary | ICD-10-CM | POA: Insufficient documentation

## 2013-11-24 DIAGNOSIS — R011 Cardiac murmur, unspecified: Secondary | ICD-10-CM | POA: Insufficient documentation

## 2013-11-24 DIAGNOSIS — N390 Urinary tract infection, site not specified: Secondary | ICD-10-CM | POA: Insufficient documentation

## 2013-11-24 DIAGNOSIS — Z8659 Personal history of other mental and behavioral disorders: Secondary | ICD-10-CM | POA: Insufficient documentation

## 2013-11-24 DIAGNOSIS — I252 Old myocardial infarction: Secondary | ICD-10-CM | POA: Insufficient documentation

## 2013-11-24 LAB — URINALYSIS, ROUTINE W REFLEX MICROSCOPIC
Bilirubin Urine: NEGATIVE
Glucose, UA: 100 mg/dL — AB
KETONES UR: NEGATIVE mg/dL
NITRITE: NEGATIVE
PH: 5 (ref 5.0–8.0)
Protein, ur: >300 mg/dL — AB
Specific Gravity, Urine: 1.012 (ref 1.005–1.030)
Urobilinogen, UA: 0.2 mg/dL (ref 0.0–1.0)

## 2013-11-24 LAB — CBC WITH DIFFERENTIAL/PLATELET
Basophils Absolute: 0 10*3/uL (ref 0.0–0.1)
Basophils Relative: 0 % (ref 0–1)
Eosinophils Absolute: 0.5 10*3/uL (ref 0.0–0.7)
Eosinophils Relative: 4 % (ref 0–5)
HEMATOCRIT: 37.2 % (ref 36.0–46.0)
Hemoglobin: 12.1 g/dL (ref 12.0–15.0)
LYMPHS PCT: 17 % (ref 12–46)
Lymphs Abs: 2 10*3/uL (ref 0.7–4.0)
MCH: 26.9 pg (ref 26.0–34.0)
MCHC: 32.5 g/dL (ref 30.0–36.0)
MCV: 82.9 fL (ref 78.0–100.0)
MONO ABS: 0.8 10*3/uL (ref 0.1–1.0)
MONOS PCT: 7 % (ref 3–12)
NEUTROS ABS: 8.9 10*3/uL — AB (ref 1.7–7.7)
Neutrophils Relative %: 73 % (ref 43–77)
Platelets: 404 10*3/uL — ABNORMAL HIGH (ref 150–400)
RBC: 4.49 MIL/uL (ref 3.87–5.11)
RDW: 16 % — ABNORMAL HIGH (ref 11.5–15.5)
WBC: 12.2 10*3/uL — AB (ref 4.0–10.5)

## 2013-11-24 LAB — URINE MICROSCOPIC-ADD ON

## 2013-11-24 LAB — COMPREHENSIVE METABOLIC PANEL
ALT: 12 U/L (ref 0–35)
AST: 11 U/L (ref 0–37)
Albumin: 3.1 g/dL — ABNORMAL LOW (ref 3.5–5.2)
Alkaline Phosphatase: 199 U/L — ABNORMAL HIGH (ref 39–117)
BILIRUBIN TOTAL: 0.4 mg/dL (ref 0.3–1.2)
BUN: 68 mg/dL — AB (ref 6–23)
CHLORIDE: 104 meq/L (ref 96–112)
CO2: 18 meq/L — AB (ref 19–32)
CREATININE: 4.97 mg/dL — AB (ref 0.50–1.10)
Calcium: 9.3 mg/dL (ref 8.4–10.5)
GFR calc Af Amer: 10 mL/min — ABNORMAL LOW (ref >90)
GFR, EST NON AFRICAN AMERICAN: 9 mL/min — AB (ref >90)
GLUCOSE: 190 mg/dL — AB (ref 70–99)
Potassium: 5.3 mEq/L (ref 3.7–5.3)
Sodium: 137 mEq/L (ref 137–147)
Total Protein: 7.9 g/dL (ref 6.0–8.3)

## 2013-11-24 LAB — POCT I-STAT TROPONIN I: TROPONIN I, POC: 0.01 ng/mL (ref 0.00–0.08)

## 2013-11-24 LAB — GLUCOSE, CAPILLARY: GLUCOSE-CAPILLARY: 230 mg/dL — AB (ref 70–99)

## 2013-11-24 MED ORDER — CIPROFLOXACIN HCL 250 MG PO TABS: 250.0000 mg | ORAL_TABLET | Freq: Two times a day (BID) | ORAL | Status: DC

## 2013-11-24 NOTE — ED Notes (Signed)
NP at bedside to eval pt

## 2013-11-24 NOTE — ED Notes (Signed)
Pt is hard stick, attempted blood draw x 1 with no success, called phlebotomy

## 2013-11-24 NOTE — ED Notes (Signed)
Pt given sandwich and water. Family remains at bedside.

## 2013-11-24 NOTE — Discharge Instructions (Signed)
RICE: Routine Care for Injuries The routine care of many injuries includes Rest, Ice, Compression, and Elevation (RICE). HOME CARE INSTRUCTIONS  Rest is needed to allow your body to heal. Routine activities can usually be resumed when comfortable. Injured tendons and bones can take up to 6 weeks to heal. Tendons are the cord-like structures that attach muscle to bone.  Ice following an injury helps keep the swelling down and reduces pain.  Put ice in a plastic bag.  Place a towel between your skin and the bag.  Leave the ice on for 15-20 minutes, 03-04 times a day. Do this while awake, for the first 24 to 48 hours. After that, continue as directed by your caregiver.  Compression helps keep swelling down. It also gives support and helps with discomfort. If an elastic bandage has been applied, it should be removed and reapplied every 3 to 4 hours. It should not be applied tightly, but firmly enough to keep swelling down. Watch fingers or toes for swelling, bluish discoloration, coldness, numbness, or excessive pain. If any of these problems occur, remove the bandage and reapply loosely. Contact your caregiver if these problems continue.  Elevation helps reduce swelling and decreases pain. With extremities, such as the arms, hands, legs, and feet, the injured area should be placed near or above the level of the heart, if possible. SEEK IMMEDIATE MEDICAL CARE IF:  You have persistent pain and swelling.  You develop redness, numbness, or unexpected weakness.  Your symptoms are getting worse rather than improving after several days. These symptoms may indicate that further evaluation or further X-rays are needed. Sometimes, X-rays may not show a small broken bone (fracture) until 1 week or 10 days later. Make a follow-up appointment with your caregiver. Ask when your X-ray results will be ready. Make sure you get your X-ray results. Document Released: 01/15/2001 Document Revised: 12/26/2011  Document Reviewed: 03/04/2011 [facility] Patient Information 2014 [place], [place]. Urinary Tract Infection Urinary tract infections (UTIs) can develop anywhere along your urinary tract. Your urinary tract is your body's drainage system for removing wastes and extra water. Your urinary tract includes two kidneys, two ureters, a bladder, and a urethra. Your kidneys are a pair of bean-shaped organs. Each kidney is about the size of your fist. They are located below your ribs, one on each side of your spine. CAUSES Infections are caused by microbes, which are microscopic organisms, including fungi, viruses, and bacteria. These organisms are so small that they can only be seen through a microscope. Bacteria are the microbes that most commonly cause UTIs. SYMPTOMS  Symptoms of UTIs may vary by age and gender of the patient and by the location of the infection. Symptoms in young women typically include a frequent and intense urge to urinate and a painful, burning feeling in the bladder or urethra during urination. Older women and men are more likely to be tired, shaky, and weak and have muscle aches and abdominal pain. A fever may mean the infection is in your kidneys. Other symptoms of a kidney infection include pain in your back or sides below the ribs, nausea, and vomiting. DIAGNOSIS To diagnose a UTI, your caregiver will ask you about your symptoms. Your caregiver also will ask to provide a urine sample. The urine sample will be tested for bacteria and white blood cells. White blood cells are made by your body to help fight infection. TREATMENT  Typically, UTIs can be treated with medication. Because most UTIs are caused by a bacterial  infection, they usually can be treated with the use of antibiotics. The choice of antibiotic and length of treatment depend on your symptoms and the type of bacteria causing your infection. HOME CARE INSTRUCTIONS  If you were prescribed antibiotics, take them exactly as your  caregiver instructs you. Finish the medication even if you feel better after you have only taken some of the medication.  Drink enough water and fluids to keep your urine clear or pale yellow.  Avoid caffeine, tea, and carbonated beverages. They tend to irritate your bladder.  Empty your bladder often. Avoid holding urine for long periods of time.  Empty your bladder before and after sexual intercourse.  After a bowel movement, women should cleanse from front to back. Use each tissue only once. SEEK MEDICAL CARE IF:   You have back pain.  You develop a fever.  Your symptoms do not begin to resolve within 3 days. SEEK IMMEDIATE MEDICAL CARE IF:   You have severe back pain or lower abdominal pain.  You develop chills.  You have nausea or vomiting.  You have continued burning or discomfort with urination. MAKE SURE YOU:   Understand these instructions.  Will watch your condition.  Will get help right away if you are not doing well or get worse. Document Released: 07/13/2005 Document Revised: 04/03/2012 Document Reviewed: 11/11/2011 [facility] Patient Information 2014 [place].  Follow up with Dr. Marval Regal in 1-2 days If symptoms worsen or if chest pain returns come back to ER

## 2013-11-24 NOTE — ED Notes (Signed)
Pt states that last nite she was walking and fell backwards and this is the second time this week that it has happened.  Pt states she stepped down and then fell backwards and hit the back of her head.  Pt states she feels nauseated and is hurting on the right side of her body.  Pt has a place on her left breast that has been bothering her.  Pt reports diabetes

## 2013-11-24 NOTE — ED Notes (Signed)
Patient transported to X-ray 

## 2013-11-24 NOTE — ED Provider Notes (Signed)
CSN: [id number]     Arrival date & time 11/24/13  [id number] History   First MD Initiated Contact with Patient 11/24/13 1131     No chief complaint on file.  (Consider location/radiation/quality/duration/timing/severity/associated sxs/prior Treatment) Patient is a 58 y.o. female presenting with fall. The history is provided by the patient. No language interpreter was used.  Fall This is a new problem. The current episode started yesterday. Pertinent negatives include no chest pain, chills, congestion, coughing, fever, headaches, joint swelling, nausea, numbness, vertigo, vomiting or weakness.   Pt is a 58 year old female who presents to the ER today with the history of a fall last evening while walking outside. She reports that she fell and hit her head on the curb and her right leg was bent up underneath her. She denies any episodes of dizziness, palpitations or syncope. She denies any reports of recent illness, fever or difficulty breathing. No recent sick exposure or travel. She does admit to having some chest pain on/off for the last two weeks with some periodic nausea. She reports that this chest pain is unrelated to her activity level. She denies having any chest pain or shortness of breath at todays visit. She also reports an area of skin underneath her left breast that is irritated and had kept her from wearing a bra.   Past Medical History  Diagnosis Date  . Hypertension   . Hyperlipidemia   . Diabetes mellitus     diagnosed in 1988, on insulin.  . Anemia     2/2 renal failure  . Diabetic retinopathy     s/p PRP OS  . History of UTI   . Depression   . History of TIAs     10/08- twisted face and slurred speech without extremity weakness  . History of MI (myocardial infarction)   . Bleeding     History of retinal bleed on full dose aspirin  . S/P left heart catheterization by cutdown     July 2003, normal coronary arteries.  . Arthritis     ? Hands  . Chronic kidney disease    stage 3, 2/2 DM and HTN.-not on diaylsis yet  . CHF (congestive heart failure)   . Myocardial infarction     per patient about 5 yrs ago  . Palpitations   . Chest pain   . Hyperparathyroidism   . Thyroid disease   . Shortness of breath     exersion  . Pneumonia     hx  . Obstructive sleep apnea     to have new sleep study 04/25/13 does not use cpap  . Stroke     tia hx  . Heart murmur    Past Surgical History  Procedure Laterality Date  . Hernia repair    . Cholecystectomy    . Abdominal hysterectomy    . Umbical hernia    . Eye surgery      Laser DM   . Av fistula placement  12/05/2011    Procedure: ARTERIOVENOUS (AV) FISTULA CREATION;  Surgeon: Elam Dutch, MD;  Location: [facility] OR;  Service: Vascular;  Laterality: Right;  Creation of Arteriovenous fistula right lower arm   . Revison of arteriovenous fistula Right 04/17/2013    Procedure: REVISON OF ARTERIOVENOUS FISTULA and superficialization;  Surgeon: Elam Dutch, MD;  Location: [facility] OR;  Service: Vascular;  Laterality: Right;   Family History  Problem Relation Age of Onset  . Cancer Mother     lung smoker  .  Diabetes Mother   . Hypertension Mother   . Heart disease Mother     CHF, died at 73  . Hyperlipidemia Mother   . Heart attack Mother   . Diabetes Father   . Hypertension Father   . Heart disease Father     CHF, died at 42  . Hyperlipidemia Father   . Heart attack Father   . Cancer Sister     breast cancer  . Diabetes Son   . Heart disease Son   . Hypertension Son   . Hypertension Maternal Grandmother   . Hypertension Paternal Grandmother   . Colon cancer Neg Hx   . Anesthesia problems Neg Hx    History  Substance Use Topics  . Smoking status: Former Smoker -- 1.00 packs/day for 10 years    Types: Cigarettes    Quit date: 01/12/1983  . Smokeless tobacco: Never Used  . Alcohol Use: No   OB History   Grav Para Term Preterm Abortions TAB SAB Ect Mult Living                 Review of Systems   Constitutional: Negative for fever and chills.  HENT: Negative for congestion.   Respiratory: Negative for cough.   Cardiovascular: Negative for chest pain.  Gastrointestinal: Negative for nausea and vomiting.  Musculoskeletal: Negative for back pain, gait problem and joint swelling.  Neurological: Negative for vertigo, facial asymmetry, speech difficulty, weakness, light-headedness, numbness and headaches.  All other systems reviewed and are negative.    Allergies  Codeine and Hydrocodone  Home Medications   Current Outpatient Rx  Name  Route  Sig  Dispense  Refill  . amLODipine (NORVASC) 10 MG tablet   Oral   Take 10 mg by mouth daily.           Marland Kitchen aspirin EC 81 MG tablet   Oral   Take 81 mg by mouth daily.         . calcitRIOL (ROCALTROL) 0.25 MCG capsule   Oral   Take 0.25 mcg by mouth Daily.         . carvedilol (COREG) 25 MG tablet   Oral   Take 25 mg by mouth 2 (two) times daily with a meal.           . doxazosin (CARDURA) 1 MG tablet   Oral   Take 1 mg by mouth at bedtime.           Marland Kitchen epoetin alfa (PROCRIT) 09811 UNIT/ML injection   Subcutaneous   Inject 20,000 Units into the skin every 14 (fourteen) days. Next dose is on 17th of June.         . furosemide (LASIX) 40 MG tablet   Oral   Take 40 mg by mouth 2 (two) times daily.          . insulin aspart protamine-insulin aspart (NOVOLOG 70/30) (70-30) 100 UNIT/ML injection   Subcutaneous   Inject 65 Units into the skin daily with breakfast.           BP 176/79  Pulse 91  Temp(Src) 98.7 F (37.1 C) (Oral)  Resp 16  Wt 191 lb (86.637 kg)  SpO2 97% Physical Exam  Nursing note and vitals reviewed. Constitutional: She is oriented to person, place, and time. She appears well-developed and well-nourished. No distress.  HENT:  Head: Normocephalic and atraumatic.  Right Ear: External ear normal.  Left Ear: External ear normal.  Mouth/Throat: Oropharynx is clear and moist.  Eyes:  Conjunctivae and EOM are normal. Pupils are equal, round, and reactive to light.  Neck: Normal range of motion. Neck supple. No JVD present. No tracheal deviation present. No thyromegaly present.  Cardiovascular: Normal rate, regular rhythm, normal heart sounds and intact distal pulses.   Pulmonary/Chest: Effort normal and breath sounds normal. No respiratory distress.  Abdominal: Soft. Bowel sounds are normal.  Musculoskeletal: Normal range of motion.  Lymphadenopathy:    She has no cervical adenopathy.  Neurological: She is alert and oriented to person, place, and time. She has normal strength. No cranial nerve deficit or sensory deficit. She displays a negative Romberg sign. Coordination and gait normal.  Skin: Skin is warm and dry.  Psychiatric: She has a normal mood and affect. Her behavior is normal. Judgment and thought content normal.    ED Course  Procedures (including critical care time) Labs Review Labs Reviewed  GLUCOSE, CAPILLARY - Abnormal; Notable for the following:    Glucose-Capillary 230 (*)    All other components within normal limits  CBC WITH DIFFERENTIAL - Abnormal; Notable for the following:    WBC 12.2 (*)    RDW 16.0 (*)    Platelets 404 (*)    Neutro Abs 8.9 (*)    All other components within normal limits  COMPREHENSIVE METABOLIC PANEL - Abnormal; Notable for the following:    CO2 18 (*)    Glucose, Bld 190 (*)    BUN 68 (*)    Creatinine, Ser 4.97 (*)    Albumin 3.1 (*)    Alkaline Phosphatase 199 (*)    GFR calc non Af Amer 9 (*)    GFR calc Af Amer 10 (*)    All other components within normal limits  URINALYSIS, ROUTINE W REFLEX MICROSCOPIC  POCT I-STAT TROPONIN I   Imaging Review Dg Chest 2 View  11/24/2013   CLINICAL DATA:  Chest pain.  EXAM: CHEST  2 VIEW  COMPARISON:  04/10/2013  FINDINGS: Heart is borderline in size. No confluent airspace opacities or effusions. Mediastinal contours are within normal limits. No acute bony abnormality.   IMPRESSION: No active cardiopulmonary disease.   Electronically Signed   By: Rolm Baptise M.D.   On: 11/24/2013 14:13   Dg Knee Complete 4 Views Right  11/24/2013   CLINICAL DATA:  Pain post trauma  EXAM: RIGHT KNEE - COMPLETE 4+ VIEW  COMPARISON:  None.  FINDINGS: Frontal, lateral, and bilateral oblique views were obtained. There is no fracture, dislocation, or effusion. Joint spaces appear intact there is calcification along the distal insertion site of the quadriceps tendon on the patella.  IMPRESSION: Probable distal quadriceps tendinosis.  No fracture or effusion.   Electronically Signed   By: Lowella Grip M.D.   On: 11/24/2013 14:14    EKG Interpretation   None      Date: 11/24/2013  Rate: 97   Rhythm: normal sinus rhythm  QRS Axis: normal  Intervals: normal  ST/T Wave abnormalities: normal  Conduction Disutrbances:none  Narrative Interpretation: Normal sinus Rhythm  Old EKG Reviewed: none available    MDM   1. Fall   2. UTI (lower urinary tract infection)     Pt feeling better after resting here for a period of time in ER. Urinalysis significant for UTI. EKG within normal limits. Pt awake, alert and cooperative with exam. Ambulatory without any deficits or weakness. Neuro exam reassuring. Negative romberg. Good strength and coordination. Home with prescription for ciprofloxacin. Will follow-up with PCP. Return  precautions given. Pt and family agree with plan.      Elisha Headland, NP 12/01/13 2253

## 2013-11-26 LAB — URINE CULTURE: Colony Count: >=100000

## 2013-11-28 NOTE — Procedures (Signed)
   [facility]  EEG (ELECTROENCEPHALOGRAM) REPORT   STUDY DATE: 11/07/13 PATIENT NAME: [name] DOB: [birth year] MRN: [id number]  ORDERING CLINICIAN: Jim Like, DO   TECHNOLOGIST: Towana Badger TECHNIQUE: Electroencephalogram was recorded utilizing standard 10-20 system of lead placement and reformatted into average and bipolar montages.  RECORDING TIME: 24 minutes ACTIVATION: photic stimulation  CLINICAL INFORMATION: 58 year old female with recurrent speech difficulties. TIA vs seizure.  FINDINGS: Background rhythms of 9-10 hertz and 40-50 microvolts. No focal, lateralizing, epileptiform activity or seizures are seen. Patient recorded in the awake state. EKG channel shows regular rhythm of 78 beats per minute.  IMPRESSION:  Normal EEG in the awake state.   INTERPRETING PHYSICIAN:  Penni Bombard, MD Certified in Neurology, Neurophysiology and Neuroimaging  [facility] [address] [place], [place] [address] [phone]

## 2013-12-02 NOTE — ED Provider Notes (Signed)
Medical screening examination/treatment/procedure(s) were performed by non-physician practitioner and as supervising physician I was immediately available for consultation/collaboration.  EKG Interpretation    Date/Time:  Sunday November 24 2013 10:16:03 EST Ventricular Rate:  97 PR Interval:  144 QRS Duration: 80 QT Interval:  344 QTC Calculation: 436 R Axis:   10 Text Interpretation:  Normal sinus rhythm Minimal voltage criteria for LVH, may be normal variant Borderline ECG ED PHYSICIAN INTERPRETATION AVAILABLE IN [facility] Confirmed by TEST, RECORD ([id number]) on 11/26/2013 7:14:01 AM              Malvin Johns, MD 12/02/13 1352

## 2013-12-03 ENCOUNTER — Inpatient Hospital Stay (HOSPITAL_COMMUNITY): Admission: RE | Admit: 2013-12-03 | Payer: Medicaid Other | Source: Ambulatory Visit

## 2013-12-10 ENCOUNTER — Encounter (HOSPITAL_COMMUNITY)
Admission: RE | Admit: 2013-12-10 | Discharge: 2013-12-10 | Disposition: A | Discharge status: Home / Self Care | Payer: Medicaid Other | Source: Ambulatory Visit | Attending: Nephrology | Admitting: Nephrology

## 2013-12-10 VITALS — BP 139/76 | HR 87 | Temp 98.1°F | Resp 20

## 2013-12-10 DIAGNOSIS — D631 Anemia in chronic kidney disease: Secondary | ICD-10-CM

## 2013-12-10 DIAGNOSIS — N039 Chronic nephritic syndrome with unspecified morphologic changes: Principal | ICD-10-CM

## 2013-12-10 LAB — COMPREHENSIVE METABOLIC PANEL
ALBUMIN: 3.1 g/dL — AB (ref 3.5–5.2)
ALT: 14 U/L (ref 0–35)
AST: 12 U/L (ref 0–37)
Alkaline Phosphatase: 217 U/L — ABNORMAL HIGH (ref 39–117)
BILIRUBIN TOTAL: 0.4 mg/dL (ref 0.3–1.2)
BUN: 82 mg/dL — ABNORMAL HIGH (ref 6–23)
CO2: 23 meq/L (ref 19–32)
CREATININE: 5.62 mg/dL — AB (ref 0.50–1.10)
Calcium: 9.8 mg/dL (ref 8.4–10.5)
Chloride: 96 mEq/L (ref 96–112)
GFR, EST AFRICAN AMERICAN: 9 mL/min — AB (ref >90)
GFR, EST NON AFRICAN AMERICAN: 7 mL/min — AB (ref >90)
Glucose, Bld: 284 mg/dL — ABNORMAL HIGH (ref 70–99)
Potassium: 6.7 mEq/L (ref 3.7–5.3)
Sodium: 134 mEq/L — ABNORMAL LOW (ref 137–147)
Total Protein: 7.7 g/dL (ref 6.0–8.3)

## 2013-12-10 LAB — POCT HEMOGLOBIN-HEMACUE: HEMOGLOBIN: 11.5 g/dL — AB (ref 12.0–15.0)

## 2013-12-10 LAB — HEPATITIS B SURFACE ANTIGEN: Hepatitis B Surface Ag: NEGATIVE

## 2013-12-10 MED ORDER — EPOETIN ALFA 20000 UNIT/ML IJ SOLN
INTRAMUSCULAR | Status: AC
  Filled 2013-12-10: qty 1

## 2013-12-10 MED ORDER — EPOETIN ALFA 20000 UNIT/ML IJ SOLN
20000.0000 [IU] | INTRAMUSCULAR | Status: DC
  Administered 2013-12-10: 20000 [IU] via SUBCUTANEOUS

## 2013-12-16 ENCOUNTER — Ambulatory Visit: Payer: Medicaid Other | Admitting: Pulmonary Disease

## 2013-12-18 ENCOUNTER — Ambulatory Visit (INDEPENDENT_AMBULATORY_CARE_PROVIDER_SITE_OTHER): Payer: Medicaid Other | Admitting: Pulmonary Disease

## 2013-12-18 ENCOUNTER — Encounter: Payer: Self-pay | Admitting: Pulmonary Disease

## 2013-12-18 VITALS — BP 140/82 | HR 89 | Temp 98.2°F | Ht 64.0 in | Wt 189.0 lb

## 2013-12-18 DIAGNOSIS — G4733 Obstructive sleep apnea (adult) (pediatric): Secondary | ICD-10-CM

## 2013-12-18 NOTE — Progress Notes (Signed)
   Subjective:    Patient ID: [name], female    DOB: 10/16/1956, 58 y.o.   MRN: [id number]  HPI The patient comes in today for followup of her obstructive sleep apnea. She has been wearing CPAP compliantly with a good response, but has been having issues with her nasal pillows mask. She would like to change to a nasal mask. Her pressure has been optimized, and she is tolerating it well. She has seen a big difference in her sleep and how she feels the next day. Of note, her weight is down 3 pounds since last visit.   Review of Systems  Constitutional: Negative for fever and unexpected weight change.  HENT: Negative for congestion, dental problem, ear pain, nosebleeds, postnasal drip, rhinorrhea, sinus pressure, sneezing, sore throat and trouble swallowing.   Eyes: Negative for redness and itching.  Respiratory: Negative for cough, chest tightness, shortness of breath and wheezing.   Cardiovascular: Negative for palpitations and leg swelling.  Gastrointestinal: Negative for nausea and vomiting.  Genitourinary: Negative for dysuria.  Musculoskeletal: Negative for joint swelling.  Skin: Negative for rash.  Neurological: Negative for headaches.  Hematological: Does not bruise/bleed easily.  Psychiatric/Behavioral: Negative for dysphoric mood. The patient is not nervous/anxious.        Objective:   Physical Exam Overweight female in no acute distress Nose without purulence or discharge noted. No skin breakdown or pressure necrosis from the CPAP mask Neck without lymphadenopathy or thyromegaly Lower extremities with mild edema, no cyanosis Alert and oriented, moves all 4 extremities.       Assessment & Plan:

## 2013-12-18 NOTE — Patient Instructions (Signed)
Will send an order to your home care company to show you different nasal masks.  Keep working on weight loss followup with me again in one year if doing well.

## 2013-12-18 NOTE — Assessment & Plan Note (Signed)
The patient is doing very well on her current CPAP setup, but would like to change to a different nasal mask setup. I will go ahead and send an order to her home care company, and I've also encouraged her to work aggressively on weight loss. She is to followup with me in one year if doing well.

## 2013-12-24 ENCOUNTER — Encounter (HOSPITAL_COMMUNITY)
Admission: RE | Admit: 2013-12-24 | Discharge: 2013-12-24 | Disposition: A | Discharge status: Home / Self Care | Payer: Medicaid Other | Source: Ambulatory Visit | Attending: Nephrology | Admitting: Nephrology

## 2013-12-24 VITALS — BP 160/72 | HR 83 | Temp 98.8°F | Resp 20

## 2013-12-24 DIAGNOSIS — N039 Chronic nephritic syndrome with unspecified morphologic changes: Principal | ICD-10-CM

## 2013-12-24 DIAGNOSIS — D631 Anemia in chronic kidney disease: Secondary | ICD-10-CM | POA: Insufficient documentation

## 2013-12-24 LAB — POCT HEMOGLOBIN-HEMACUE: Hemoglobin: 10.3 g/dL — ABNORMAL LOW (ref 12.0–15.0)

## 2013-12-24 MED ORDER — EPOETIN ALFA 20000 UNIT/ML IJ SOLN
INTRAMUSCULAR | Status: AC
  Filled 2013-12-24: qty 1

## 2013-12-24 MED ORDER — EPOETIN ALFA 20000 UNIT/ML IJ SOLN
20000.0000 [IU] | INTRAMUSCULAR | Status: DC
  Administered 2013-12-24: 20000 [IU] via SUBCUTANEOUS

## 2014-01-06 ENCOUNTER — Encounter (HOSPITAL_COMMUNITY)
Admission: RE | Admit: 2014-01-06 | Discharge: 2014-01-06 | Disposition: A | Discharge status: Home / Self Care | Payer: Medicaid Other | Source: Ambulatory Visit | Attending: Nephrology | Admitting: Nephrology

## 2014-01-06 VITALS — BP 151/68 | HR 85 | Temp 97.7°F | Resp 20

## 2014-01-06 DIAGNOSIS — D631 Anemia in chronic kidney disease: Secondary | ICD-10-CM

## 2014-01-06 DIAGNOSIS — N039 Chronic nephritic syndrome with unspecified morphologic changes: Principal | ICD-10-CM

## 2014-01-06 LAB — COMPREHENSIVE METABOLIC PANEL
ALK PHOS: 145 U/L — AB (ref 39–117)
ALT: 14 U/L (ref 0–35)
AST: 12 U/L (ref 0–37)
Albumin: 3 g/dL — ABNORMAL LOW (ref 3.5–5.2)
BUN: 86 mg/dL — ABNORMAL HIGH (ref 6–23)
CO2: 19 mEq/L (ref 19–32)
Calcium: 9 mg/dL (ref 8.4–10.5)
Chloride: 101 mEq/L (ref 96–112)
Creatinine, Ser: 5.49 mg/dL — ABNORMAL HIGH (ref 0.50–1.10)
GFR calc Af Amer: 9 mL/min — ABNORMAL LOW (ref >90)
GFR calc non Af Amer: 8 mL/min — ABNORMAL LOW (ref >90)
Glucose, Bld: 182 mg/dL — ABNORMAL HIGH (ref 70–99)
Potassium: 5.1 mEq/L (ref 3.7–5.3)
Sodium: 137 mEq/L (ref 137–147)
TOTAL PROTEIN: 7.3 g/dL (ref 6.0–8.3)
Total Bilirubin: 0.3 mg/dL (ref 0.3–1.2)

## 2014-01-06 LAB — POCT HEMOGLOBIN-HEMACUE: HEMOGLOBIN: 11.1 g/dL — AB (ref 12.0–15.0)

## 2014-01-06 MED ORDER — EPOETIN ALFA 20000 UNIT/ML IJ SOLN: 20000.0000 [IU] | INTRAMUSCULAR | Status: DC

## 2014-01-06 MED ORDER — EPOETIN ALFA 20000 UNIT/ML IJ SOLN
INTRAMUSCULAR | Status: AC
  Administered 2014-01-06: 20000 [IU] via SUBCUTANEOUS
  Filled 2014-01-06: qty 1

## 2014-01-20 ENCOUNTER — Encounter (HOSPITAL_COMMUNITY)
Admission: RE | Admit: 2014-01-20 | Discharge: 2014-01-20 | Disposition: A | Discharge status: Home / Self Care | Payer: Medicaid Other | Source: Ambulatory Visit | Attending: Nephrology | Admitting: Nephrology

## 2014-01-20 VITALS — BP 177/73 | HR 90 | Temp 97.8°F | Resp 20

## 2014-01-20 DIAGNOSIS — N039 Chronic nephritic syndrome with unspecified morphologic changes: Secondary | ICD-10-CM

## 2014-01-20 DIAGNOSIS — Z5181 Encounter for therapeutic drug level monitoring: Secondary | ICD-10-CM | POA: Diagnosis present

## 2014-01-20 DIAGNOSIS — N189 Chronic kidney disease, unspecified: Secondary | ICD-10-CM | POA: Insufficient documentation

## 2014-01-20 DIAGNOSIS — D631 Anemia in chronic kidney disease: Secondary | ICD-10-CM

## 2014-01-20 LAB — IRON AND TIBC
Iron: 53 ug/dL (ref 42–135)
SATURATION RATIOS: 21 % (ref 20–55)
TIBC: 255 ug/dL (ref 250–470)
UIBC: 202 ug/dL (ref 125–400)

## 2014-01-20 LAB — FERRITIN: Ferritin: 205 ng/mL (ref 10–291)

## 2014-01-20 MED ORDER — EPOETIN ALFA 20000 UNIT/ML IJ SOLN: 20000.0000 [IU] | INTRAMUSCULAR | Status: DC

## 2014-01-20 MED ORDER — EPOETIN ALFA 20000 UNIT/ML IJ SOLN
INTRAMUSCULAR | Status: AC
  Administered 2014-01-20: 20000 [IU] via SUBCUTANEOUS
  Filled 2014-01-20: qty 1

## 2014-01-21 LAB — PTH, INTACT AND CALCIUM
Calcium, Total (PTH): 8.6 mg/dL (ref 8.4–10.5)
PTH: 434 pg/mL — ABNORMAL HIGH (ref 14.0–72.0)

## 2014-01-21 LAB — POCT HEMOGLOBIN-HEMACUE: Hemoglobin: 10.7 g/dL — ABNORMAL LOW (ref 12.0–15.0)

## 2014-02-03 ENCOUNTER — Inpatient Hospital Stay (HOSPITAL_COMMUNITY): Admission: RE | Admit: 2014-02-03 | Payer: Medicaid Other | Source: Ambulatory Visit

## 2014-02-10 ENCOUNTER — Encounter (HOSPITAL_COMMUNITY)
Admission: RE | Admit: 2014-02-10 | Discharge: 2014-02-10 | Disposition: A | Discharge status: Home / Self Care | Payer: Medicaid Other | Source: Ambulatory Visit | Attending: Nephrology | Admitting: Nephrology

## 2014-02-10 VITALS — BP 155/74 | HR 80 | Temp 98.3°F | Resp 20

## 2014-02-10 DIAGNOSIS — D638 Anemia in other chronic diseases classified elsewhere: Secondary | ICD-10-CM | POA: Diagnosis present

## 2014-02-10 DIAGNOSIS — N183 Chronic kidney disease, stage 3 unspecified: Secondary | ICD-10-CM | POA: Diagnosis not present

## 2014-02-10 DIAGNOSIS — D631 Anemia in chronic kidney disease: Secondary | ICD-10-CM

## 2014-02-10 DIAGNOSIS — N039 Chronic nephritic syndrome with unspecified morphologic changes: Secondary | ICD-10-CM

## 2014-02-10 LAB — POCT HEMOGLOBIN-HEMACUE: Hemoglobin: 10.9 g/dL — ABNORMAL LOW (ref 12.0–15.0)

## 2014-02-10 LAB — COMPREHENSIVE METABOLIC PANEL
ALT: 14 U/L (ref 0–35)
AST: 13 U/L (ref 0–37)
Albumin: 3.1 g/dL — ABNORMAL LOW (ref 3.5–5.2)
Alkaline Phosphatase: 122 U/L — ABNORMAL HIGH (ref 39–117)
BILIRUBIN TOTAL: 0.3 mg/dL (ref 0.3–1.2)
BUN: 68 mg/dL — ABNORMAL HIGH (ref 6–23)
CHLORIDE: 102 meq/L (ref 96–112)
CO2: 21 meq/L (ref 19–32)
CREATININE: 5.91 mg/dL — AB (ref 0.50–1.10)
Calcium: 9.4 mg/dL (ref 8.4–10.5)
GFR calc Af Amer: 8 mL/min — ABNORMAL LOW (ref >90)
GFR, EST NON AFRICAN AMERICAN: 7 mL/min — AB (ref >90)
Glucose, Bld: 121 mg/dL — ABNORMAL HIGH (ref 70–99)
Potassium: 5.1 mEq/L (ref 3.7–5.3)
Sodium: 138 mEq/L (ref 137–147)
Total Protein: 7.7 g/dL (ref 6.0–8.3)

## 2014-02-10 MED ORDER — EPOETIN ALFA 20000 UNIT/ML IJ SOLN
INTRAMUSCULAR | Status: AC
  Filled 2014-02-10: qty 1

## 2014-02-10 MED ORDER — EPOETIN ALFA 20000 UNIT/ML IJ SOLN
20000.0000 [IU] | INTRAMUSCULAR | Status: DC
  Administered 2014-02-10: 20000 [IU] via SUBCUTANEOUS

## 2014-02-11 LAB — HEPATITIS B SURFACE ANTIGEN: HEP B S AG: NEGATIVE

## 2014-02-18 ENCOUNTER — Telehealth: Payer: Self-pay | Admitting: Neurology

## 2014-02-18 NOTE — Telephone Encounter (Signed)
Patient calling to state that she has not heard back about the tests that she had done in January and is starting dialysis next week and wants to schedule a follow up appointment. Please call and advise patient.

## 2014-02-19 NOTE — Telephone Encounter (Signed)
I called pt and relayed the results of her tests to her.  She starts dialysis possibly on this Friday or next week.  She stated would put our name for information about her.  I relayed that no f/u needed.  She verbalized understanding.

## 2014-02-19 NOTE — Telephone Encounter (Signed)
Doppler, MRI brain, EEG.

## 2014-02-19 NOTE — Telephone Encounter (Signed)
Please let her know her workup for TIA was negative. Her EEG was also normal. As long as the eye doctor gave her the ok, she should take a daily aspirin 81mg . Thanks.

## 2014-02-24 ENCOUNTER — Inpatient Hospital Stay (HOSPITAL_COMMUNITY): Admission: RE | Admit: 2014-02-24 | Payer: Medicaid Other | Source: Ambulatory Visit

## 2014-05-27 ENCOUNTER — Other Ambulatory Visit (HOSPITAL_COMMUNITY): Payer: Self-pay | Admitting: *Deleted

## 2014-05-27 DIAGNOSIS — Z1231 Encounter for screening mammogram for malignant neoplasm of breast: Secondary | ICD-10-CM

## 2014-05-29 ENCOUNTER — Other Ambulatory Visit (HOSPITAL_COMMUNITY): Payer: Self-pay | Admitting: Nephrology

## 2014-05-29 ENCOUNTER — Ambulatory Visit (HOSPITAL_COMMUNITY)
Admission: RE | Admit: 2014-05-29 | Discharge: 2014-05-29 | Disposition: A | Discharge status: Home / Self Care | Payer: Medicare Other | Source: Ambulatory Visit | Attending: Nephrology | Admitting: Nephrology

## 2014-05-29 ENCOUNTER — Ambulatory Visit (HOSPITAL_COMMUNITY): Payer: Medicaid Other

## 2014-05-29 DIAGNOSIS — Z1231 Encounter for screening mammogram for malignant neoplasm of breast: Secondary | ICD-10-CM | POA: Diagnosis not present

## 2014-09-01 ENCOUNTER — Emergency Department (HOSPITAL_COMMUNITY): Payer: Medicare Other

## 2014-09-01 ENCOUNTER — Observation Stay (HOSPITAL_COMMUNITY)
Admission: EM | Admit: 2014-09-01 | Discharge: 2014-09-02 | Disposition: A | Discharge status: Home / Self Care | Payer: Medicare Other | Attending: Internal Medicine | Admitting: Internal Medicine

## 2014-09-01 ENCOUNTER — Encounter (HOSPITAL_COMMUNITY): Payer: Self-pay | Admitting: Emergency Medicine

## 2014-09-01 DIAGNOSIS — R079 Chest pain, unspecified: Secondary | ICD-10-CM | POA: Diagnosis present

## 2014-09-01 DIAGNOSIS — Z992 Dependence on renal dialysis: Secondary | ICD-10-CM | POA: Diagnosis not present

## 2014-09-01 DIAGNOSIS — I509 Heart failure, unspecified: Secondary | ICD-10-CM | POA: Diagnosis not present

## 2014-09-01 DIAGNOSIS — E079 Disorder of thyroid, unspecified: Secondary | ICD-10-CM | POA: Insufficient documentation

## 2014-09-01 DIAGNOSIS — E11319 Type 2 diabetes mellitus with unspecified diabetic retinopathy without macular edema: Secondary | ICD-10-CM | POA: Insufficient documentation

## 2014-09-01 DIAGNOSIS — E785 Hyperlipidemia, unspecified: Secondary | ICD-10-CM | POA: Diagnosis not present

## 2014-09-01 DIAGNOSIS — E119 Type 2 diabetes mellitus without complications: Secondary | ICD-10-CM

## 2014-09-01 DIAGNOSIS — Z8673 Personal history of transient ischemic attack (TIA), and cerebral infarction without residual deficits: Secondary | ICD-10-CM | POA: Insufficient documentation

## 2014-09-01 DIAGNOSIS — I2510 Atherosclerotic heart disease of native coronary artery without angina pectoris: Secondary | ICD-10-CM | POA: Insufficient documentation

## 2014-09-01 DIAGNOSIS — F329 Major depressive disorder, single episode, unspecified: Secondary | ICD-10-CM | POA: Diagnosis not present

## 2014-09-01 DIAGNOSIS — I252 Old myocardial infarction: Secondary | ICD-10-CM | POA: Insufficient documentation

## 2014-09-01 DIAGNOSIS — N183 Chronic kidney disease, stage 3 (moderate): Secondary | ICD-10-CM | POA: Insufficient documentation

## 2014-09-01 DIAGNOSIS — M1990 Unspecified osteoarthritis, unspecified site: Secondary | ICD-10-CM | POA: Insufficient documentation

## 2014-09-01 DIAGNOSIS — I129 Hypertensive chronic kidney disease with stage 1 through stage 4 chronic kidney disease, or unspecified chronic kidney disease: Secondary | ICD-10-CM | POA: Diagnosis not present

## 2014-09-01 DIAGNOSIS — R002 Palpitations: Secondary | ICD-10-CM | POA: Insufficient documentation

## 2014-09-01 DIAGNOSIS — D631 Anemia in chronic kidney disease: Secondary | ICD-10-CM | POA: Diagnosis not present

## 2014-09-01 DIAGNOSIS — N189 Chronic kidney disease, unspecified: Secondary | ICD-10-CM

## 2014-09-01 DIAGNOSIS — N186 End stage renal disease: Secondary | ICD-10-CM

## 2014-09-01 DIAGNOSIS — I10 Essential (primary) hypertension: Secondary | ICD-10-CM | POA: Diagnosis present

## 2014-09-01 DIAGNOSIS — G4733 Obstructive sleep apnea (adult) (pediatric): Secondary | ICD-10-CM

## 2014-09-01 DIAGNOSIS — E118 Type 2 diabetes mellitus with unspecified complications: Secondary | ICD-10-CM

## 2014-09-01 LAB — CBC
HCT: 34.3 % — ABNORMAL LOW (ref 36.0–46.0)
HEMATOCRIT: 32.5 % — AB (ref 36.0–46.0)
Hemoglobin: 10.9 g/dL — ABNORMAL LOW (ref 12.0–15.0)
Hemoglobin: 10.3 g/dL — ABNORMAL LOW (ref 12.0–15.0)
MCH: 29.5 pg (ref 26.0–34.0)
MCH: 29.8 pg (ref 26.0–34.0)
MCHC: 31.8 g/dL (ref 30.0–36.0)
MCHC: 31.7 g/dL (ref 30.0–36.0)
MCV: 93 fL (ref 78.0–100.0)
MCV: 93.9 fL (ref 78.0–100.0)
PLATELETS: 316 10*3/uL (ref 150–400)
Platelets: 280 10*3/uL (ref 150–400)
RBC: 3.69 MIL/uL — ABNORMAL LOW (ref 3.87–5.11)
RBC: 3.46 MIL/uL — ABNORMAL LOW (ref 3.87–5.11)
RDW: 14.1 % (ref 11.5–15.5)
RDW: 14.2 % (ref 11.5–15.5)
WBC: 8.2 10*3/uL (ref 4.0–10.5)
WBC: 9.8 10*3/uL (ref 4.0–10.5)

## 2014-09-01 LAB — I-STAT TROPONIN, ED
TROPONIN I, POC: 0 ng/mL (ref 0.00–0.08)
Troponin i, poc: 0.01 ng/mL (ref 0.00–0.08)

## 2014-09-01 LAB — BASIC METABOLIC PANEL
ANION GAP: 11 (ref 5–15)
BUN: 11 mg/dL (ref 6–23)
CHLORIDE: 98 meq/L (ref 96–112)
CO2: 29 mEq/L (ref 19–32)
Calcium: 8.8 mg/dL (ref 8.4–10.5)
Creatinine, Ser: 2.25 mg/dL — ABNORMAL HIGH (ref 0.50–1.10)
GFR, EST AFRICAN AMERICAN: 26 mL/min — AB (ref >90)
GFR, EST NON AFRICAN AMERICAN: 23 mL/min — AB (ref >90)
Glucose, Bld: 128 mg/dL — ABNORMAL HIGH (ref 70–99)
Potassium: 3.5 mEq/L — ABNORMAL LOW (ref 3.7–5.3)
SODIUM: 138 meq/L (ref 137–147)

## 2014-09-01 LAB — TROPONIN I
Troponin I: <0.3 ng/mL (ref <0.30)
Troponin I: <0.3 ng/mL (ref <0.30)

## 2014-09-01 LAB — PROTIME-INR
INR: 0.99 (ref 0.00–1.49)
Prothrombin Time: 13.2 seconds (ref 11.6–15.2)

## 2014-09-01 LAB — CREATININE, SERUM
Creatinine, Ser: 3.01 mg/dL — ABNORMAL HIGH (ref 0.50–1.10)
GFR calc non Af Amer: 16 mL/min — ABNORMAL LOW (ref >90)
GFR, EST AFRICAN AMERICAN: 18 mL/min — AB (ref >90)

## 2014-09-01 LAB — GLUCOSE, CAPILLARY: Glucose-Capillary: 299 mg/dL — ABNORMAL HIGH (ref 70–99)

## 2014-09-01 LAB — APTT: aPTT: 35 seconds (ref 24–37)

## 2014-09-01 MED ORDER — SEVELAMER CARBONATE 800 MG PO TABS
800.0000 mg | ORAL_TABLET | ORAL | Status: DC | PRN
  Filled 2014-09-01: qty 1

## 2014-09-01 MED ORDER — MORPHINE SULFATE 4 MG/ML IJ SOLN
4.0000 mg | Freq: Once | INTRAMUSCULAR | Status: AC
  Administered 2014-09-01: 4 mg via INTRAVENOUS
  Filled 2014-09-01: qty 1

## 2014-09-01 MED ORDER — CHOLECALCIFEROL 50 MCG (2000 UT) PO CAPS: 2000.0000 [IU] | ORAL_CAPSULE | Freq: Every day | ORAL | Status: DC

## 2014-09-01 MED ORDER — NITROGLYCERIN 2 % TD OINT
1.0000 [in_us] | TOPICAL_OINTMENT | Freq: Once | TRANSDERMAL | Status: AC
  Administered 2014-09-01: 1 [in_us] via TOPICAL
  Filled 2014-09-01: qty 1

## 2014-09-01 MED ORDER — SODIUM CHLORIDE 0.9 % IV SOLN
1000.0000 mL | INTRAVENOUS | Status: DC
  Administered 2014-09-01: 1000 mL via INTRAVENOUS

## 2014-09-01 MED ORDER — CARVEDILOL 25 MG PO TABS
25.0000 mg | ORAL_TABLET | Freq: Two times a day (BID) | ORAL | Status: DC
  Administered 2014-09-01 – 2014-09-02 (×2): 25 mg via ORAL
  Filled 2014-09-01 (×4): qty 1

## 2014-09-01 MED ORDER — MORPHINE SULFATE 2 MG/ML IJ SOLN
2.0000 mg | INTRAMUSCULAR | Status: DC | PRN
  Administered 2014-09-02: 2 mg via INTRAVENOUS
  Filled 2014-09-01: qty 1

## 2014-09-01 MED ORDER — AMLODIPINE BESYLATE 10 MG PO TABS
10.0000 mg | ORAL_TABLET | Freq: Every day | ORAL | Status: DC
  Administered 2014-09-01 – 2014-09-02 (×2): 10 mg via ORAL
  Filled 2014-09-01 (×2): qty 1

## 2014-09-01 MED ORDER — INSULIN ASPART PROT & ASPART (70-30 MIX) 100 UNIT/ML ~~LOC~~ SUSP
50.0000 [IU] | Freq: Every day | SUBCUTANEOUS | Status: DC
  Administered 2014-09-02: 50 [IU] via SUBCUTANEOUS
  Filled 2014-09-01: qty 10

## 2014-09-01 MED ORDER — SEVELAMER CARBONATE 800 MG PO TABS
2400.0000 mg | ORAL_TABLET | Freq: Three times a day (TID) | ORAL | Status: DC
  Administered 2014-09-01 – 2014-09-02 (×3): 2400 mg via ORAL
  Filled 2014-09-01 (×5): qty 3

## 2014-09-01 MED ORDER — ASPIRIN EC 325 MG PO TBEC
325.0000 mg | DELAYED_RELEASE_TABLET | Freq: Every day | ORAL | Status: DC
  Administered 2014-09-02: 325 mg via ORAL
  Filled 2014-09-01: qty 1

## 2014-09-01 MED ORDER — ONDANSETRON HCL 4 MG/2ML IJ SOLN: 4.0000 mg | Freq: Four times a day (QID) | INTRAMUSCULAR | Status: DC | PRN

## 2014-09-01 MED ORDER — CALCITRIOL 0.25 MCG PO CAPS
0.2500 ug | ORAL_CAPSULE | Freq: Every day | ORAL | Status: DC
  Administered 2014-09-01 – 2014-09-02 (×2): 0.25 ug via ORAL
  Filled 2014-09-01 (×2): qty 1

## 2014-09-01 MED ORDER — HEPARIN SODIUM (PORCINE) 5000 UNIT/ML IJ SOLN
5000.0000 [IU] | Freq: Three times a day (TID) | INTRAMUSCULAR | Status: DC
  Administered 2014-09-01 – 2014-09-02 (×2): 5000 [IU] via SUBCUTANEOUS
  Filled 2014-09-01 (×4): qty 1

## 2014-09-01 MED ORDER — ASPIRIN 81 MG PO CHEW
324.0000 mg | CHEWABLE_TABLET | Freq: Once | ORAL | Status: AC
  Administered 2014-09-01: 324 mg via ORAL
  Filled 2014-09-01: qty 4

## 2014-09-01 MED ORDER — ACETAMINOPHEN 325 MG PO TABS: 650.0000 mg | ORAL_TABLET | ORAL | Status: DC | PRN

## 2014-09-01 MED ORDER — RENA-VITE PO TABS
1.0000 | ORAL_TABLET | Freq: Every day | ORAL | Status: DC
  Administered 2014-09-01 – 2014-09-02 (×2): 1 via ORAL
  Filled 2014-09-01 (×2): qty 1

## 2014-09-01 MED ORDER — DOXAZOSIN MESYLATE 2 MG PO TABS
2.0000 mg | ORAL_TABLET | Freq: Every day | ORAL | Status: DC
  Administered 2014-09-01: 2 mg via ORAL
  Filled 2014-09-01 (×2): qty 1

## 2014-09-01 MED ORDER — VITAMIN D3 25 MCG (1000 UNIT) PO TABS
2000.0000 [IU] | ORAL_TABLET | Freq: Every day | ORAL | Status: DC
  Administered 2014-09-01 – 2014-09-02 (×2): 2000 [IU] via ORAL
  Filled 2014-09-01 (×2): qty 2

## 2014-09-01 NOTE — Plan of Care (Signed)
Problem: Consults Goal: Tobacco Cessation referral if indicated Outcome: Not Applicable Date Met:  41/42/39 Goal: Nutrition Consult-if indicated Outcome: Not Applicable Date Met:  53/20/23  Problem: Phase I Progression Outcomes Goal: Hemodynamically stable Outcome: Completed/Met Date Met:  09/01/14

## 2014-09-01 NOTE — Progress Notes (Signed)
Spoke with pt about CPAP. Pt states she does wear one at home but she does not want one tonight. Instructed pt to let nurse know if she changed her mind.

## 2014-09-01 NOTE — ED Provider Notes (Signed)
CSN: [id number]     Arrival date & time 09/01/14  1046 History   First MD Initiated Contact with Patient 09/01/14 1051     Chief Complaint  Patient presents with  . Chest Pain   HPI Pt has history of CAD and MI.  No prior stents.  States she had a cath in 2013 or 2014.  Started having chest pressure while in dialysis today.  Became more severe.  Ongoing for a few hours.  No shortness of breath.  Some nausea.  She also feels like she has to burp.  She has noticed some issues when she was eating recently.  Not like what she is experiencing today though.  No fever or cough. Past Medical History  Diagnosis Date  . Hypertension   . Hyperlipidemia   . Diabetes mellitus     diagnosed in 1988, on insulin.  . Anemia     2/2 renal failure  . Diabetic retinopathy     s/p PRP OS  . History of UTI   . Depression   . History of TIAs     10/08- twisted face and slurred speech without extremity weakness  . History of MI (myocardial infarction)   . Bleeding     History of retinal bleed on full dose aspirin  . S/P left heart catheterization by cutdown     July 2003, normal coronary arteries.  . Arthritis     ? Hands  . Chronic kidney disease     stage 3, 2/2 DM and HTN.-not on diaylsis yet  . CHF (congestive heart failure)   . Myocardial infarction     per patient about 5 yrs ago  . Palpitations   . Chest pain   . Hyperparathyroidism   . Thyroid disease   . Shortness of breath     exersion  . Pneumonia     hx  . Obstructive sleep apnea     to have new sleep study 04/25/13 does not use cpap  . Stroke     tia hx  . Heart murmur    Past Surgical History  Procedure Laterality Date  . Hernia repair    . Cholecystectomy    . Abdominal hysterectomy    . Umbical hernia    . Eye surgery      Laser DM   . Av fistula placement  12/05/2011    Procedure: ARTERIOVENOUS (AV) FISTULA CREATION;  Surgeon: Elam Dutch, MD;  Location: [facility] OR;  Service: Vascular;  Laterality: Right;  Creation  of Arteriovenous fistula right lower arm   . Revison of arteriovenous fistula Right 04/17/2013    Procedure: REVISON OF ARTERIOVENOUS FISTULA and superficialization;  Surgeon: Elam Dutch, MD;  Location: [facility] OR;  Service: Vascular;  Laterality: Right;   Family History  Problem Relation Age of Onset  . Cancer Mother     lung smoker  . Diabetes Mother   . Hypertension Mother   . Heart disease Mother     CHF, died at 7  . Hyperlipidemia Mother   . Heart attack Mother   . Diabetes Father   . Hypertension Father   . Heart disease Father     CHF, died at 68  . Hyperlipidemia Father   . Heart attack Father   . Cancer Sister     breast cancer  . Diabetes Son   . Heart disease Son   . Hypertension Son   . Hypertension Maternal Grandmother   . Hypertension Paternal Grandmother   .  Colon cancer Neg Hx   . Anesthesia problems Neg Hx    History  Substance Use Topics  . Smoking status: Former Smoker -- 1.00 packs/day for 10 years    Types: Cigarettes    Quit date: 01/12/1983  . Smokeless tobacco: Never Used  . Alcohol Use: No   OB History    No data available     Review of Systems  All other systems reviewed and are negative.     Allergies  Codeine and Hydrocodone  Home Medications   Prior to Admission medications   Medication Sig Start Date End Date Taking? Authorizing Provider  amLODipine (NORVASC) 10 MG tablet Take 10 mg by mouth daily.     Yes Historical Provider, MD  calcitRIOL (ROCALTROL) 0.25 MCG capsule Take 0.25 mcg by mouth Daily. 11/16/11  Yes Historical Provider, MD  carvedilol (COREG) 25 MG tablet Take 25 mg by mouth 2 (two) times daily with a meal.     Yes Historical Provider, MD  [employer] D3 2000 UNITS CAPS Take 2,000 Units by mouth daily.  08/12/14  Yes Historical Provider, MD  doxazosin (CARDURA) 2 MG tablet Take 2 mg by mouth at bedtime.  08/11/14  Yes Historical Provider, MD  epoetin alfa (PROCRIT) 09811 UNIT/ML injection Inject 20,000 Units into the skin  every 14 (fourteen) days. In dialysis   Yes Historical Provider, MD  insulin aspart protamine-insulin aspart (NOVOLOG 70/30) (70-30) 100 UNIT/ML injection Inject 65 Units into the skin daily with breakfast.    Yes Historical Provider, MD  multivitamin (RENA-VIT) TABS tablet Take 1 tablet by mouth daily.   Yes Historical Provider, MD  RENVELA 800 MG tablet Take 800-2,400 mg by mouth See admin instructions. Take 3 tablets with meals and take 1 tablet with snacks 08/27/14  Yes Historical Provider, MD   BP 160/68 mmHg  Pulse 85  Temp(Src) 98.2 F (36.8 C) (Oral)  Resp 16  SpO2 98% Physical Exam  Constitutional: She appears well-developed and well-nourished. No distress.  HENT:  Head: Normocephalic and atraumatic.  Right Ear: External ear normal.  Left Ear: External ear normal.  Eyes: Conjunctivae are normal. Right eye exhibits no discharge. Left eye exhibits no discharge. No scleral icterus.  Neck: Neck supple. No tracheal deviation present.  Cardiovascular: Normal rate, regular rhythm and intact distal pulses.   Pulmonary/Chest: Effort normal and breath sounds normal. No stridor. No respiratory distress. She has no wheezes. She has no rales.  Abdominal: Soft. Bowel sounds are normal. She exhibits no distension. There is no tenderness. There is no rebound and no guarding.  Musculoskeletal: She exhibits no edema or tenderness.  Av fistula right arm, not a strong thrill  Neurological: She is alert. She has normal strength. No cranial nerve deficit (no facial droop, extraocular movements intact, no slurred speech) or sensory deficit. She exhibits normal muscle tone. She displays no seizure activity. Coordination normal.  Skin: Skin is warm and dry. No rash noted.  Psychiatric: She has a normal mood and affect.  Nursing note and vitals reviewed.   ED Course  Procedures (including critical care time) Labs Review Labs Reviewed  CBC - Abnormal; Notable for the following:    RBC 3.69 (*)     Hemoglobin 10.9 (*)    HCT 34.3 (*)    All other components within normal limits  BASIC METABOLIC PANEL - Abnormal; Notable for the following:    Potassium 3.5 (*)    Glucose, Bld 128 (*)    Creatinine, Ser 2.25 (*)  GFR calc non Af Amer 23 (*)    GFR calc Af Amer 26 (*)    All other components within normal limits  APTT  PROTIME-INR  I-STAT TROPOININ, ED  Randolm Idol, ED    Imaging Review Dg Chest Portable 1 View  09/01/2014   CLINICAL DATA:  Chest pain.  CHF.  On dialysis.  EXAM: PORTABLE CHEST - 1 VIEW  COMPARISON:  None.  FINDINGS: The heart size and mediastinal contours are within normal limits. Decreased lung volumes. Both lungs are clear. The visualized skeletal structures are unremarkable.  IMPRESSION: 1. Low lung volumes.   Electronically Signed   By: Kerby Moors M.D.   On: 09/01/2014 13:50     EKG Interpretation   Date/Time:  Monday September 01 2014 10:54:33 EST Ventricular Rate:  84 PR Interval:  139 QRS Duration: 91 QT Interval:  431 QTC Calculation: 509 R Axis:   34 Text Interpretation:  Sinus rhythm Borderline prolonged QT interval  Baseline wander in lead(s) II V6 No significant change since last tracing  Confirmed by Nader Boys  MD-J, Yesha Muchow ([id number]) on 09/01/2014 12:54:11 PM     Medications  0.9 %  sodium chloride infusion (1,000 mLs Intravenous New Bag/Given 09/01/14 1317)  aspirin chewable tablet 324 mg (324 mg Oral Given 09/01/14 1321)  nitroGLYCERIN (NITROGLYN) 2 % ointment 1 inch (1 inch Topical Given 09/01/14 1320)  morphine 4 MG/ML injection 4 mg (4 mg Intravenous Given 09/01/14 1318)    MDM   Final diagnoses:  Chest pain, unspecified chest pain type  Chronic renal failure, unspecified stage    Some symptoms may be related to acid reflux. However the patient does have history of coronary artery disease by her report. She has multiple risk factors. Considering the severity of her symptoms and risk factors I will have the patient admitted to  the hospital for serial cardiac enzymes and further testing.    Dorie Rank, MD 09/01/14 1420

## 2014-09-01 NOTE — ED Notes (Signed)
Pt arrives via EMS from dialysis center where pt began having left sided chest pain, with nausea and pressure towards the end of her dialysis treatment. Pt finished treatment and was transport via EMS here. Pt received 1 nitro in transport with relief of pain from 10/10 to 4/10. Pt reports she feels like she needs to burp. Pt currently awake, alert still c/o left sided chest pain, tearful. R arm fistula accessed from dialysis center.

## 2014-09-01 NOTE — Plan of Care (Signed)
Problem: Phase I Progression Outcomes Goal: Anginal pain relieved Outcome: Completed/Met Date Met:  09/01/14 Goal: Aspirin unless contraindicated Outcome: Completed/Met Date Met:  09/01/14 Goal: Voiding-avoid urinary catheter unless indicated Outcome: Completed/Met Date Met:  09/01/14     

## 2014-09-01 NOTE — H&P (Signed)
Patient Demographics  [name], is a 58 y.o. female  MRN: [id number]   DOB - 04/08/1956  Admit Date - 09/01/2014  Outpatient Primary MD for the patient is [name], ERIC, MD   With History of -  Past Medical History  Diagnosis Date  . Hypertension   . Hyperlipidemia   . Diabetes mellitus     diagnosed in 1988, on insulin.  . Anemia     2/2 renal failure  . Diabetic retinopathy     s/p PRP OS  . History of UTI   . Depression   . History of TIAs     10/08- twisted face and slurred speech without extremity weakness  . History of MI (myocardial infarction)   . Bleeding     History of retinal bleed on full dose aspirin  . S/P left heart catheterization by cutdown     July 2003, normal coronary arteries.  . Arthritis     ? Hands  . Chronic kidney disease     stage 3, 2/2 DM and HTN.-not on diaylsis yet  . CHF (congestive heart failure)   . Myocardial infarction     per patient about 5 yrs ago  . Palpitations   . Chest pain   . Hyperparathyroidism   . Thyroid disease   . Shortness of breath     exersion  . Pneumonia     hx  . Obstructive sleep apnea     to have new sleep study 04/25/13 does not use cpap  . Stroke     tia hx  . Heart murmur       Past Surgical History  Procedure Laterality Date  . Hernia repair    . Cholecystectomy    . Abdominal hysterectomy    . Umbical hernia    . Eye surgery      Laser DM   . Av fistula placement  12/05/2011    Procedure: ARTERIOVENOUS (AV) FISTULA CREATION;  Surgeon: Elam Dutch, MD;  Location: [facility] OR;  Service: Vascular;  Laterality: Right;  Creation of Arteriovenous fistula right lower arm   . Revison of arteriovenous fistula Right 04/17/2013    Procedure: REVISON OF ARTERIOVENOUS FISTULA and superficialization;  Surgeon: Elam Dutch, MD;  Location: [place];  Service: Vascular;  Laterality: Right;    in for   Chief Complaint  Patient presents with  . Chest Pain     HPI  [name]  is a 58 y.o. female,  past medical history of diabetes mellitus, hyperlipidemia, hypertension, obstructive sleep apnea,end-stage renal disease recently started on hemodialysis May 2015, questionable history of coronary artery disease on medical records, patient denies any such history, and workup including cardiac cath in 2003 and stress test 2011 as per our records is negative, as well patient reports she had negative stress test done last month at [facility] as a part of her workup to be on a transplant list, patient reports his stress test was normal. Patient presents with complaints of chest pain during hemodialysis today, reports its and epigastric/left chest area, nonradiating, denies any shortness of breath, nausea, vomiting, palpitation, diaphoresis, reports she does have some sensation that she needs to belch and early satiety as well, she received sublingual nitroglycerin and aspirin, EKG did not have significant changes, had 2 negative troponins, hospitalist requested to admit the patient for further workup.  -patient had negative dobutamine stress echocardiogram done on 07/31/14 as pretransplant at [facility]. Review of Systems    In addition to the HPI above,  No Fever-chills, No Headache, No changes with Vision or hearing, No problems swallowing food or Liquids, Complains of Chest pain, denies anyCough or Shortness of Breath, No Abdominal pain, No Nausea or Vommitting, Bowel movements are regular, No Blood in stool or Urine, No dysuria, No new skin rashes or bruises, No new joints pains-aches,  No new weakness, tingling, numbness in any extremity, No recent weight gain or loss, No polyuria, polydypsia or polyphagia, No significant Mental Stressors.  A full 10 point Review of Systems was done, except as stated above, all other Review of Systems were negative.   Social History History  Substance Use Topics  . Smoking status: Former Smoker -- 1.00 packs/day  for 10 years    Types: Cigarettes    Quit date: 01/12/1983  . Smokeless tobacco: Never Used  . Alcohol Use: No     Family History Family History  Problem Relation Age of Onset  . Cancer Mother     lung smoker  . Diabetes Mother   . Hypertension Mother   . Heart disease Mother     CHF, died at 25  . Hyperlipidemia Mother   . Heart attack Mother   . Diabetes Father   . Hypertension Father   . Heart disease Father     CHF, died at 61  . Hyperlipidemia Father   . Heart attack Father   . Cancer Sister     breast cancer  . Diabetes Son   . Heart disease Son   . Hypertension Son   . Hypertension Maternal Grandmother   . Hypertension Paternal Grandmother   . Colon cancer Neg Hx   . Anesthesia problems Neg Hx      Prior to Admission medications   Medication Sig Start Date End Date Taking? Authorizing Provider  amLODipine (NORVASC) 10 MG tablet Take 10 mg by mouth daily.     Yes Historical Provider, MD  calcitRIOL (ROCALTROL) 0.25 MCG capsule Take 0.25 mcg by mouth Daily. 11/16/11  Yes Historical Provider, MD  carvedilol (COREG) 25 MG tablet Take 25 mg by mouth 2 (two) times daily with a meal.     Yes Historical Provider, MD  CVS D3 2000 UNITS CAPS Take 2,000 Units by mouth daily.  08/12/14  Yes Historical Provider, MD  doxazosin (CARDURA) 2 MG tablet Take 2 mg by mouth at bedtime.  08/11/14  Yes Historical Provider, MD  epoetin alfa (PROCRIT) 16109 UNIT/ML injection Inject 20,000 Units into the skin every 14 (fourteen) days. In dialysis   Yes Historical Provider, MD  insulin aspart protamine-insulin aspart (NOVOLOG 70/30) (70-30) 100 UNIT/ML injection Inject 65 Units into the skin daily with breakfast.    Yes Historical Provider, MD  multivitamin (RENA-VIT) TABS tablet Take 1 tablet by mouth daily.   Yes Historical Provider, MD  RENVELA 800 MG tablet Take 800-2,400 mg by mouth See admin instructions. Take 3 tablets with meals and take 1 tablet with snacks 08/27/14  Yes Historical  Provider, MD    Allergies  Allergen Reactions  . Codeine Nausea And Vomiting  . Hydrocodone Other (See Comments)    hallucinations    Physical Exam  Vitals  Blood pressure 133/56, pulse 84, temperature 98.2 F (36.8 C), temperature source Oral, resp. rate 17, SpO2 96 %.   1. General well-nourished female lying in bed in NAD,    2. Normal affect and insight, Not Suicidal or Homicidal, Awake Alert, Oriented  X 3.  3. No F.N deficits, ALL C.Nerves Intact, Strength 5/5 all 4 extremities, Sensation intact all 4 extremities, Plantars down going.  4. Ears and Eyes appear Normal, Conjunctivae clear, PERRLA. Moist Oral Mucosa.  5. Supple Neck, No JVD, No cervical lymphadenopathy appriciated, No Carotid Bruits.  6. Symmetrical Chest wall movement, Good air movement bilaterally, CTAB.  7. RRR, No Gallops, Rubs or Murmurs, No Parasternal Heave.has reproducible chest pain on palpation in the left side resembles her chest pain earlier today.  8. Positive Bowel Sounds, Abdomen Soft, No tenderness, No organomegaly appriciated,No rebound -guarding or rigidity.  9.  No Cyanosis, Normal Skin Turgor, No Skin Rash or Bruise.  10. Good muscle tone,  joints appear normal , no effusions, Normal ROM.  11. No Palpable Lymph Nodes in Neck or Axillae    Data Review  CBC  Recent Labs Lab 09/01/14 1129  WBC 8.2  HGB 10.9*  HCT 34.3*  PLT 316  MCV 93.0  MCH 29.5  MCHC 31.8  RDW 14.1   ------------------------------------------------------------------------------------------------------------------  Chemistries   Recent Labs Lab 09/01/14 1129  NA 138  K 3.5*  CL 98  CO2 29  GLUCOSE 128*  BUN 11  CREATININE 2.25*  CALCIUM 8.8   ------------------------------------------------------------------------------------------------------------------ CrCl cannot be calculated (Unknown ideal  weight.). ------------------------------------------------------------------------------------------------------------------ No results for input(s): TSH, T4TOTAL, T3FREE, THYROIDAB in the last 72 hours.  Invalid input(s): FREET3   Coagulation profile  Recent Labs Lab 09/01/14 1129  INR 0.99   ------------------------------------------------------------------------------------------------------------------- No results for input(s): DDIMER in the last 72 hours. -------------------------------------------------------------------------------------------------------------------  Cardiac Enzymes No results for input(s): CKMB, TROPONINI, MYOGLOBIN in the last 168 hours.  Invalid input(s): CK ------------------------------------------------------------------------------------------------------------------ Invalid input(s): POCBNP   ---------------------------------------------------------------------------------------------------------------  Urinalysis    Component Value Date/Time   COLORURINE YELLOW 11/24/2013 1539   APPEARANCEUR TURBID* 11/24/2013 1539   LABSPEC 1.012 11/24/2013 1539   PHURINE 5.0 11/24/2013 1539   GLUCOSEU 100* 11/24/2013 1539   HGBUR SMALL* 11/24/2013 1539   HGBUR negative 05/27/2010 0836   BILIRUBINUR NEGATIVE 11/24/2013 1539   KETONESUR NEGATIVE 11/24/2013 1539   PROTEINUR >300* 11/24/2013 1539   UROBILINOGEN 0.2 11/24/2013 1539   NITRITE NEGATIVE 11/24/2013 1539   LEUKOCYTESUR LARGE* 11/24/2013 1539    ----------------------------------------------------------------------------------------------------------------  Imaging results:   Dg Chest Portable 1 View  09/01/2014   CLINICAL DATA:  Chest pain.  CHF.  On dialysis.  EXAM: PORTABLE CHEST - 1 VIEW  COMPARISON:  None.  FINDINGS: The heart size and mediastinal contours are within normal limits. Decreased lung volumes. Both lungs are clear. The visualized skeletal structures are unremarkable.   IMPRESSION: 1. Low lung volumes.   Electronically Signed   By: Kerby Moors M.D.   On: 09/01/2014 13:50    My personal review of EKG: Rhythm NSR, Rate  84 /min, QTc 509 , no Acute ST changes    Assessment & Plan  Principal Problem:   Chest pain Active Problems:   Diabetes mellitus   Hyperlipemia   Anemia of chronic renal failure   HTN (hypertension)   ESRD (end stage renal disease)   OSA (obstructive sleep apnea)    Chest pain -Patient is currently chest pain-free,  Patient has history of hyperlipidemia, diabetes, hypertension, so she will be admitted for further workup and evaluation of her chest pain, but this chest pain does not appear to be cardiac, as it does have epigastric and musculoskeletal quality. -patient had negative dobutamine stress echocardiogram done on 07/31/14 as pretransplant at [facility]. -We'll admit her to telemetry unit, continue to cycle her cardiac enzymes and follow the trend, meanwhile will continue her on aspirin, she is already on beta blockers. -if her cardiac enzymes are negative, she can be discharged in a.m.  End-stage renal disease -Patient was dialyzed today, if she remains here till Wednesday and then will consult nephrology for hemodialysis.  Anemia -Secondary to end-stage renal disease, patient is on  Aranesp during dialysis.  Obstructive sleep apnea -Continue with C Pap at bedtime  Hypertension -Resume home medications.  Diabetes mellitus -Resume Novolin 70/30 but at a lower dose ( will decrease from 65-50), will add insulin sliding scale.    DVT Prophylaxis Heparin -  AM Labs Ordered, also please review Full Orders  Family Communication: Admission, patients condition and plan of care including tests being ordered have been discussed with the patient who indicate understanding and agree with the plan and Code Status.  Code Status Full  Disposition; admit to telemetry  Condition GUARDED   Time  spent in minutes :55 minutes   ELGERGAWY, DAWOOD M.D on 09/01/2014 at 3:25 PM  Between 7am to 7pm - Pager - [phone]  After 7pm go to [email or web address] - password [email or web address]  And look for the night coverage person covering me after hours  [facility] Office  [phone]   **Disclaimer: This note may have been dictated with voice recognition software. Similar sounding words can inadvertently be transcribed and this note may contain transcription errors which may not have been corrected upon publication of note.**

## 2014-09-01 NOTE — ED Notes (Signed)
MD at bedside. 

## 2014-09-01 NOTE — ED Notes (Signed)
Attempted report 

## 2014-09-02 DIAGNOSIS — I2510 Atherosclerotic heart disease of native coronary artery without angina pectoris: Secondary | ICD-10-CM | POA: Diagnosis not present

## 2014-09-02 DIAGNOSIS — R079 Chest pain, unspecified: Secondary | ICD-10-CM | POA: Diagnosis not present

## 2014-09-02 DIAGNOSIS — E785 Hyperlipidemia, unspecified: Secondary | ICD-10-CM | POA: Diagnosis not present

## 2014-09-02 DIAGNOSIS — E11319 Type 2 diabetes mellitus with unspecified diabetic retinopathy without macular edema: Secondary | ICD-10-CM | POA: Diagnosis not present

## 2014-09-02 LAB — GLUCOSE, CAPILLARY
GLUCOSE-CAPILLARY: 83 mg/dL (ref 70–99)
GLUCOSE-CAPILLARY: 61 mg/dL — AB (ref 70–99)
Glucose-Capillary: 144 mg/dL — ABNORMAL HIGH (ref 70–99)

## 2014-09-02 LAB — TROPONIN I: Troponin I: <0.3 ng/mL (ref <0.30)

## 2014-09-02 MED ORDER — METOCLOPRAMIDE HCL 5 MG PO TABS: 5.0000 mg | ORAL_TABLET | Freq: Three times a day (TID) | ORAL | Status: DC

## 2014-09-02 NOTE — Progress Notes (Signed)
Discharge instructions reviewed with patient. Patient verbalized understanding and questioned fully answered. Patient notes she will call to obtain MD appointment today when she leaves facility. No other needs verbalized at this time. Alfredo Bach RN BSN 09/02/2014 2:50 PM

## 2014-09-02 NOTE — Progress Notes (Signed)
Inpatient Diabetes Program Recommendations  AACE/ADA: New Consensus Statement on Inpatient Glycemic Control (2013)  Target Ranges:  Prepandial:   less than 140 mg/dL      Peak postprandial:   less than 180 mg/dL (1-2 hours)      Critically ill patients:  140 - 180 mg/dL   Inpatient Diabetes Program Recommendations Correction (SSI): consider adding Novolog sensitive scale TID Thank you  Raoul Pitch BSN, RN,CDE Inpatient Diabetes Coordinator [phone] (team pager)

## 2014-09-02 NOTE — Discharge Summary (Addendum)
Physician Discharge Summary  [name] MRN: [id number] DOB/AGE: 1955/11/06 58 y.o.  PCP: Kevan Ny, MD   Admit date: 09/01/2014 Discharge date: 09/02/2014  Discharge Diagnoses:  .    Chest pain , noncardiac   Diabetes mellitus   Hyperlipemia   Anemia of chronic renal failure   HTN (hypertension)   ESRD (end stage renal disease)   OSA (obstructive sleep apnea)  Follow-up recommendations follow-up with PCP in 5-7 days Patient may benefit from a gastric emptying study    Medication List    TAKE these medications        amLODipine 10 MG tablet  Commonly known as:  NORVASC  Take 10 mg by mouth daily.     calcitRIOL 0.25 MCG capsule  Commonly known as:  ROCALTROL  Take 0.25 mcg by mouth Daily.     carvedilol 25 MG tablet  Commonly known as:  COREG  Take 25 mg by mouth 2 (two) times daily with a meal.     CVS D3 2000 UNITS Caps  Generic drug:  Cholecalciferol  Take 2,000 Units by mouth daily.     doxazosin 2 MG tablet  Commonly known as:  CARDURA  Take 2 mg by mouth at bedtime.     insulin aspart protamine- aspart (70-30) 100 UNIT/ML injection  Commonly known as:  NOVOLOG MIX 70/30  Inject 65 Units into the skin daily with breakfast.     metoCLOPramide 5 MG tablet  Commonly known as:  REGLAN  Take 1 tablet (5 mg total) by mouth 3 (three) times daily before meals.     multivitamin Tabs tablet  Take 1 tablet by mouth daily.     PROCRIT 83151 UNIT/ML injection  Generic drug:  epoetin alfa  Inject 20,000 Units into the skin every 14 (fourteen) days. In dialysis     RENVELA 800 MG tablet  Generic drug:  sevelamer carbonate  Take 800-2,400 mg by mouth See admin instructions. Take 3 tablets with meals and take 1 tablet with snacks        Discharge Condition:stable   Disposition: 01-Home or Self Care   Consults: None   Significant Diagnostic Studies: Dg Chest Portable 1 View  09/01/2014   CLINICAL DATA:  Chest pain.  CHF.  On dialysis.   EXAM: PORTABLE CHEST - 1 VIEW  COMPARISON:  None.  FINDINGS: The heart size and mediastinal contours are within normal limits. Decreased lung volumes. Both lungs are clear. The visualized skeletal structures are unremarkable.  IMPRESSION: 1. Low lung volumes.   Electronically Signed   By: Kerby Moors M.D.   On: 09/01/2014 13:50   none  Microbiology: No results found for this or any previous visit (from the past 240 hour(s)).   Labs: Results for orders placed or performed during the hospital encounter of 09/01/14 (from the past 48 hour(s))  CBC     Status: Abnormal   Collection Time: 09/01/14 11:29 AM  Result Value Ref Range   WBC 8.2 4.0 - 10.5 K/uL   RBC 3.69 (L) 3.87 - 5.11 MIL/uL   Hemoglobin 10.9 (L) 12.0 - 15.0 g/dL   HCT 34.3 (L) 36.0 - 46.0 %   MCV 93.0 78.0 - 100.0 fL   MCH 29.5 26.0 - 34.0 pg   MCHC 31.8 30.0 - 36.0 g/dL   RDW 14.1 11.5 - 15.5 %   Platelets 316 150 - 400 K/uL  Basic metabolic panel     Status: Abnormal   Collection Time: 09/01/14  11:29 AM  Result Value Ref Range   Sodium 138 137 - 147 mEq/L   Potassium 3.5 (L) 3.7 - 5.3 mEq/L   Chloride 98 96 - 112 mEq/L   CO2 29 19 - 32 mEq/L   Glucose, Bld 128 (H) 70 - 99 mg/dL   BUN 11 6 - 23 mg/dL   Creatinine, Ser 2.25 (H) 0.50 - 1.10 mg/dL   Calcium 8.8 8.4 - 10.5 mg/dL   GFR calc non Af Amer 23 (L) >90 mL/min   GFR calc Af Amer 26 (L) >90 mL/min    Comment: (NOTE) The eGFR has been calculated using the CKD EPI equation. This calculation has not been validated in all clinical situations. eGFR's persistently <90 mL/min signify possible Chronic Kidney Disease.    Anion gap 11 5 - 15  APTT     Status: None   Collection Time: 09/01/14 11:29 AM  Result Value Ref Range   aPTT 35 24 - 37 seconds  Protime-INR     Status: None   Collection Time: 09/01/14 11:29 AM  Result Value Ref Range   Prothrombin Time 13.2 11.6 - 15.2 seconds   INR 0.99 0.00 - 1.49  I-stat troponin, ED (not at [facility])     Status: None    Collection Time: 09/01/14 11:51 AM  Result Value Ref Range   Troponin i, poc 0.01 0.00 - 0.08 ng/mL   Comment 3            Comment: Due to the release kinetics of cTnI, a negative result within the first hours of the onset of symptoms does not rule out myocardial infarction with certainty. If myocardial infarction is still suspected, repeat the test at appropriate intervals.   I-stat troponin, ED (0, 3, 6) - do not order at [facility]     Status: None   Collection Time: 09/01/14  2:08 PM  Result Value Ref Range   Troponin i, poc 0.00 0.00 - 0.08 ng/mL   Comment 3            Comment: Due to the release kinetics of cTnI, a negative result within the first hours of the onset of symptoms does not rule out myocardial infarction with certainty. If myocardial infarction is still suspected, repeat the test at appropriate intervals.   Troponin I-serum (0, 3, 6 hours)     Status: None   Collection Time: 09/01/14  4:55 PM  Result Value Ref Range   Troponin I <0.30 <0.30 ng/mL    Comment:        Due to the release kinetics of cTnI, a negative result within the first hours of the onset of symptoms does not rule out myocardial infarction with certainty. If myocardial infarction is still suspected, repeat the test at appropriate intervals.   CBC     Status: Abnormal   Collection Time: 09/01/14  4:55 PM  Result Value Ref Range   WBC 9.8 4.0 - 10.5 K/uL   RBC 3.46 (L) 3.87 - 5.11 MIL/uL   Hemoglobin 10.3 (L) 12.0 - 15.0 g/dL   HCT 32.5 (L) 36.0 - 46.0 %   MCV 93.9 78.0 - 100.0 fL   MCH 29.8 26.0 - 34.0 pg   MCHC 31.7 30.0 - 36.0 g/dL   RDW 14.2 11.5 - 15.5 %   Platelets 280 150 - 400 K/uL  Creatinine, serum     Status: Abnormal   Collection Time: 09/01/14  4:55 PM  Result Value Ref Range   Creatinine, Ser  3.01 (H) 0.50 - 1.10 mg/dL   GFR calc non Af Amer 16 (L) >90 mL/min   GFR calc Af Amer 18 (L) >90 mL/min    Comment: (NOTE) The eGFR has been calculated using the CKD EPI  equation. This calculation has not been validated in all clinical situations. eGFR's persistently <90 mL/min signify possible Chronic Kidney Disease.   Troponin I-serum (0, 3, 6 hours)     Status: None   Collection Time: 09/01/14  9:02 PM  Result Value Ref Range   Troponin I <0.30 <0.30 ng/mL    Comment:        Due to the release kinetics of cTnI, a negative result within the first hours of the onset of symptoms does not rule out myocardial infarction with certainty. If myocardial infarction is still suspected, repeat the test at appropriate intervals.   Glucose, capillary     Status: Abnormal   Collection Time: 09/01/14  9:39 PM  Result Value Ref Range   Glucose-Capillary 299 (H) 70 - 99 mg/dL  Troponin I     Status: None   Collection Time: 09/02/14 12:34 AM  Result Value Ref Range   Troponin I <0.30 <0.30 ng/mL    Comment:        Due to the release kinetics of cTnI, a negative result within the first hours of the onset of symptoms does not rule out myocardial infarction with certainty. If myocardial infarction is still suspected, repeat the test at appropriate intervals.   Glucose, capillary     Status: Abnormal   Collection Time: 09/02/14  6:18 AM  Result Value Ref Range   Glucose-Capillary 144 (H) 70 - 99 mg/dL  Glucose, capillary     Status: None   Collection Time: 09/02/14 11:05 AM  Result Value Ref Range   Glucose-Capillary 83 70 - 99 mg/dL   Comment 1 Notify RN    Comment 2 Documented in Chart   Glucose, capillary     Status: Abnormal   Collection Time: 09/02/14 11:47 AM  Result Value Ref Range   Glucose-Capillary 61 (L) 70 - 99 mg/dL   Comment 1 Notify RN    Comment 2 Documented in Chart      HPI :[name] is a 58 y.o. female, past medical history of diabetes mellitus, hyperlipidemia, hypertension, obstructive sleep apnea,end-stage renal disease recently started on hemodialysis May 2015, questionable history of coronary artery disease on medical  records, patient denies any such history, and workup including cardiac cath in 2003 and stress test 2011 as per our records is negative, as well patient reports she had negative stress test done last month at [facility] as a part of her workup to be on a transplant list, patient reports his stress test was normal. Patient presents with complaints of chest pain during hemodialysis today, reports its and epigastric/left chest area, nonradiating, denies any shortness of breath, nausea, vomiting, palpitation, diaphoresis, reports she does have some sensation that she needs to belch and early satiety as well, she received sublingual nitroglycerin and aspirin, EKG did not have significant changes, had 2 negative troponins, hospitalist requested to admit the patient for further workup.  -patient had negative dobutamine stress echocardiogram done on 07/31/14 as pretransplant at [place]: * Chest pain -Patient is currently chest pain-free, Patient has history of hyperlipidemia, diabetes, hypertension, so was  admitted for further workup and evaluation of her chest pain,  Patient described more fullness in the epigastric  region and loss of appetite Most likely the patient has gastroparesis -patient had negative dobutamine stress echocardiogram done on 07/31/14 as pretransplant at [facility]. -telemetry and cardiac enzymes remain negative Continue beta blocker No further cardiac workup indicated, might assign stable, low suspicion for PE  Suspected gastroparesis Patient started on Reglan, small frequent meals  End-stage renal disease -Patient was dialyzed today, if she remains here till Wednesday and then will consult nephrology for hemodialysis.  Anemia -Secondary to end-stage renal disease, patient is on Aranesp during dialysis.  Obstructive sleep apnea -Continue with C Pap at bedtime  Hypertension -Resume home  medications.  Diabetes mellitus -Resume Novolin 70/30 but at a lower dose ( will decrease from 65-50), will add insulin sliding scale.    Discharge Exam: Blood pressure 143/63, pulse 79, temperature 98.2 F (36.8 C), temperature source Oral, resp. rate 16, SpO2 93 %.  Neck: Neck supple. No tracheal deviation present.  Cardiovascular: Normal rate, regular rhythm and intact distal pulses.  Pulmonary/Chest: Effort normal and breath sounds normal. No stridor. No respiratory distress. She has no wheezes. She has no rales.  Abdominal: Soft. Bowel sounds are normal. She exhibits no distension. There is no tenderness. There is no rebound and no guarding.  Musculoskeletal: She exhibits no edema or tenderness.  Av fistula right arm, not a strong thrill  Neurological: She is alert. She has normal strength. No cranial nerve deficit (no facial droop, extraocular movements intact, no slurred speech) or sensory deficit. She exhibits normal muscle tone         Follow-up Information    Follow up with Marlou Sa, ERIC, MD. Schedule an appointment as soon as possible for a visit in 1 week.   Specialty:  Internal Medicine   Contact information:   [facility] [place] [id number] [phone]       Signed: Reyne Dumas 09/02/2014, 1:21 PM

## 2014-09-02 NOTE — Progress Notes (Signed)
UR completed 

## 2014-12-01 NOTE — Progress Notes (Signed)
Cardiology Office Note   Date:  12/02/2014   ID:  [name], [name] 1956/03/28, MRN [id number]  PCP:  Kevan Ny, MD  Cardiologist:   Sueanne Margarita, MD   Chief Complaint  Patient presents with  . Chest Pain      History of Present Illness: [name] is a 59 y.o. female who presents for evaluation of chest pain.  The patient has a history of DM, HTN, oSA and ESRD on HD with ? History of CAD in medical records including cardiac cath in 2003 and stress test 2011 as per our records is negative, as well patient reports she had negative dobutamine stress test done in 07/31/2014 at [facility] as a part of her workup to be on a transplant list, patient reports stress test was normal.  She apparently had CP during HD 09/02/2015 that was epigastric and left sided and did not radiate.  There was no associated SOB/N/V or diaphoresis.  She complained of belching and early satiety.  She rule out by serial tropoinins and EKG showed no ischemia.  It was felt she had gastroparesis and no further cardiac workup indicated.  She had recurrent CP that was sharp and left sided that lasted <10 seconds on 11/14/2014 during and she is referred now for further cardiac evaluation.  She has had some chest tightness intermittently and took some TUMS which relieved the tightness.  She was placed on Reglan in November which she thinks has helped some.   She describes the chest discomfort as a tightness and sharp under her breast that occurs once monthly that is nonexertional.  It is worse with taking in deep breaths.  There is no radiation of the discomfort.  She denies any SOB, diaphoresis or nausea with the discomfort.  She occasionally has some DOE.  She denies any LE edema.     Past Medical History  Diagnosis Date  . Hypertension   . Hyperlipidemia   . Diabetes mellitus     diagnosed in 1988, on insulin.  . Anemia     2/2 renal failure  . Diabetic retinopathy     s/p PRP OS  . History of  UTI   . Depression   . History of TIAs     10/08- twisted face and slurred speech without extremity weakness  . History of MI (myocardial infarction)   . Bleeding     History of retinal bleed on full dose aspirin  . S/P left heart catheterization by cutdown     July 2003, normal coronary arteries.  . Arthritis     ? Hands  . CHF (congestive heart failure)   . Myocardial infarction     per patient about 5 yrs ago  . Palpitations   . Chest pain   . Hyperparathyroidism   . Thyroid disease   . Shortness of breath     exersion  . Pneumonia     hx  . Obstructive sleep apnea     to have new sleep study 04/25/13 does not use cpap  . Stroke     tia hx  . Heart murmur   . Chronic kidney disease     Recently started HD    Past Surgical History  Procedure Laterality Date  . Hernia repair    . Cholecystectomy    . Abdominal hysterectomy    . Umbical hernia    . Eye surgery      Laser DM   .  Av fistula placement  12/05/2011    Procedure: ARTERIOVENOUS (AV) FISTULA CREATION;  Surgeon: Elam Dutch, MD;  Location: [facility] OR;  Service: Vascular;  Laterality: Right;  Creation of Arteriovenous fistula right lower arm   . Revison of arteriovenous fistula Right 04/17/2013    Procedure: REVISON OF ARTERIOVENOUS FISTULA and superficialization;  Surgeon: Elam Dutch, MD;  Location: [facility] OR;  Service: Vascular;  Laterality: Right;     Current Outpatient Prescriptions  Medication Sig Dispense Refill  . amLODipine (NORVASC) 10 MG tablet Take 10 mg by mouth daily.      . calcitRIOL (ROCALTROL) 0.25 MCG capsule Take 0.25 mcg by mouth Daily.    . carvedilol (COREG) 25 MG tablet Take 25 mg by mouth 2 (two) times daily with a meal.      . [employer] D3 2000 UNITS CAPS Take 2,000 Units by mouth daily.   2  . doxazosin (CARDURA) 2 MG tablet Take 2 mg by mouth at bedtime.   3  . epoetin alfa (PROCRIT) 16109 UNIT/ML injection Inject 20,000 Units into the skin every 14 (fourteen) days. In dialysis    .  insulin aspart protamine-insulin aspart (NOVOLOG 70/30) (70-30) 100 UNIT/ML injection Inject 65 Units into the skin daily with breakfast.     . metoCLOPramide (REGLAN) 5 MG tablet Take 1 tablet (5 mg total) by mouth 3 (three) times daily before meals. 90 tablet 2  . multivitamin (RENA-VIT) TABS tablet Take 1 tablet by mouth daily.    Marland Kitchen RENVELA 800 MG tablet Take 800-2,400 mg by mouth See admin instructions. Take 3 tablets with meals and take 1 tablet with snacks     No current facility-administered medications for this visit.    Allergies:   Codeine; Hydrocodone; and Hydrocodone-acetaminophen    Social History:  The patient  reports that she quit smoking about 31 years ago. Her smoking use included Cigarettes. She has a 10 pack-year smoking history. She has never used smokeless tobacco. She reports that she does not drink alcohol or use illicit drugs.   Family History:  The patient's family history includes Cancer in her mother and sister; Diabetes in her father, mother, and son; Heart attack in her father and mother; Heart disease in her father, mother, and son; Hyperlipidemia in her father and mother; Hypertension in her father, maternal grandmother, mother, paternal grandmother, and son. There is no history of Colon cancer or Anesthesia problems.    ROS:  Please see the history of present illness.   Otherwise, review of systems are positive for none.   All other systems are reviewed and negative.    PHYSICAL EXAM: VS:  BP 134/68 mmHg  Pulse 86  Ht 5\' 4"  (1.626 m)  Wt 183 lb (83.008 kg)  BMI 31.40 kg/m2 , BMI Body mass index is 31.4 kg/(m^2). GEN: Well nourished, well developed, in no acute distress HEENT: normal Neck: no JVD, carotid bruits, or masses Cardiac: RRR; no murmurs, rubs, or gallops,no edema  Respiratory:  clear to auscultation bilaterally, normal work of breathing GI: soft, nontender, nondistended, + BS MS: no deformity or atrophy Skin: warm and dry, no rash Neuro:   Strength and sensation are intact Psych: euthymic mood, full affect   EKG:  EKG is not ordered today.    Recent Labs: 02/10/2014: ALT 14 09/01/2014: BUN 11; Creatinine 3.01*; Hemoglobin 10.3*; Platelets 280; Potassium 3.5*; Sodium 138    Lipid Panel    Component Value Date/Time   CHOL 286* 05/28/2010 0017  TRIG 149 05/28/2010 0017   HDL 48 05/28/2010 0017   CHOLHDL 6.0 Ratio 05/28/2010 0017   VLDL 30 05/28/2010 0017   LDLCALC 208* 05/28/2010 0017      Wt Readings from Last 3 Encounters:  12/02/14 183 lb (83.008 kg)  12/18/13 189 lb (85.73 kg)  11/24/13 191 lb (86.637 kg)        ASSESSMENT AND PLAN:  1.  Atypical chest pain that is sharp and fleeting.  Recent Dobutamine stress echo showed no ischemia in October 2015.  I suspect this is GI in origin and most likely related to gastroparesis.  Her symptoms have improved with Reglan and TUMS.  I will start her on Protonix 40mg  daily.  I will also get a gastric emptying study to evaluate further.  Symptoms are atypical for ischemia.   2.  HTN - contolled.  Continue amlodipine/Coreg/Cardura 3.  ESRD 4.  DM per PCP   Current medicines are reviewed at length with the patient today.  The patient does not have concerns regarding medicines.  The following changes have been made:  no change  Labs/ tests ordered today include: None  No orders of the defined types were placed in this encounter.     Disposition:   FU with me PRN   Signed, Sueanne Margarita, MD  12/02/2014 2:47 PM    [address], [place], [place]  [address] Phone: [phone]; Fax: [phone]

## 2014-12-02 ENCOUNTER — Ambulatory Visit (INDEPENDENT_AMBULATORY_CARE_PROVIDER_SITE_OTHER): Payer: Medicare Other | Admitting: Cardiology

## 2014-12-02 ENCOUNTER — Encounter: Payer: Self-pay | Admitting: Cardiology

## 2014-12-02 VITALS — BP 134/68 | HR 86 | Ht 64.0 in | Wt 183.0 lb

## 2014-12-02 DIAGNOSIS — I10 Essential (primary) hypertension: Secondary | ICD-10-CM

## 2014-12-02 DIAGNOSIS — R079 Chest pain, unspecified: Secondary | ICD-10-CM

## 2014-12-02 DIAGNOSIS — N189 Chronic kidney disease, unspecified: Secondary | ICD-10-CM

## 2014-12-02 DIAGNOSIS — E1122 Type 2 diabetes mellitus with diabetic chronic kidney disease: Secondary | ICD-10-CM

## 2014-12-02 DIAGNOSIS — N186 End stage renal disease: Secondary | ICD-10-CM

## 2014-12-02 DIAGNOSIS — K3184 Gastroparesis: Secondary | ICD-10-CM

## 2014-12-02 MED ORDER — PANTOPRAZOLE SODIUM 40 MG PO TBEC: 40.0000 mg | DELAYED_RELEASE_TABLET | Freq: Every day | ORAL | Status: DC

## 2014-12-02 NOTE — Patient Instructions (Addendum)
Your physician has recommended you make the following change in your medication:  1) START PROTONIX 40 mg daily  Dr. Radford Pax recommends you have a GASTRIC EMPTYING STUDY to rule out gastroparesis.  Instructions: The night BEFORE your study, do not eat anything after midnight. This test will take about 4 hours.  Your physician recommends that you schedule a follow-up appointment in: 4 weeks with Dr. Radford Pax.

## 2014-12-10 ENCOUNTER — Emergency Department (HOSPITAL_COMMUNITY): Payer: Medicare Other

## 2014-12-10 ENCOUNTER — Inpatient Hospital Stay (HOSPITAL_COMMUNITY)
Admission: EM | Admit: 2014-12-10 | Discharge: 2014-12-13 | DRG: 312 | Disposition: A | Discharge status: Home / Self Care | Payer: Medicare Other | Attending: Family Medicine | Admitting: Family Medicine

## 2014-12-10 ENCOUNTER — Encounter (HOSPITAL_COMMUNITY): Payer: Self-pay | Admitting: *Deleted

## 2014-12-10 ENCOUNTER — Inpatient Hospital Stay (HOSPITAL_COMMUNITY): Payer: Medicare Other

## 2014-12-10 DIAGNOSIS — I951 Orthostatic hypotension: Principal | ICD-10-CM | POA: Diagnosis present

## 2014-12-10 DIAGNOSIS — Z885 Allergy status to narcotic agent status: Secondary | ICD-10-CM

## 2014-12-10 DIAGNOSIS — Z886 Allergy status to analgesic agent status: Secondary | ICD-10-CM | POA: Diagnosis not present

## 2014-12-10 DIAGNOSIS — I120 Hypertensive chronic kidney disease with stage 5 chronic kidney disease or end stage renal disease: Secondary | ICD-10-CM | POA: Diagnosis present

## 2014-12-10 DIAGNOSIS — I953 Hypotension of hemodialysis: Secondary | ICD-10-CM | POA: Diagnosis present

## 2014-12-10 DIAGNOSIS — N2581 Secondary hyperparathyroidism of renal origin: Secondary | ICD-10-CM | POA: Diagnosis present

## 2014-12-10 DIAGNOSIS — R2990 Unspecified symptoms and signs involving the nervous system: Secondary | ICD-10-CM

## 2014-12-10 DIAGNOSIS — I10 Essential (primary) hypertension: Secondary | ICD-10-CM | POA: Diagnosis not present

## 2014-12-10 DIAGNOSIS — M1990 Unspecified osteoarthritis, unspecified site: Secondary | ICD-10-CM | POA: Diagnosis present

## 2014-12-10 DIAGNOSIS — R55 Syncope and collapse: Secondary | ICD-10-CM | POA: Diagnosis present

## 2014-12-10 DIAGNOSIS — E1143 Type 2 diabetes mellitus with diabetic autonomic (poly)neuropathy: Secondary | ICD-10-CM | POA: Diagnosis present

## 2014-12-10 DIAGNOSIS — Z90710 Acquired absence of both cervix and uterus: Secondary | ICD-10-CM

## 2014-12-10 DIAGNOSIS — N186 End stage renal disease: Secondary | ICD-10-CM | POA: Diagnosis present

## 2014-12-10 DIAGNOSIS — R4781 Slurred speech: Secondary | ICD-10-CM | POA: Diagnosis present

## 2014-12-10 DIAGNOSIS — G4733 Obstructive sleep apnea (adult) (pediatric): Secondary | ICD-10-CM | POA: Diagnosis present

## 2014-12-10 DIAGNOSIS — K3184 Gastroparesis: Secondary | ICD-10-CM | POA: Diagnosis present

## 2014-12-10 DIAGNOSIS — N185 Chronic kidney disease, stage 5: Secondary | ICD-10-CM

## 2014-12-10 DIAGNOSIS — Z9049 Acquired absence of other specified parts of digestive tract: Secondary | ICD-10-CM | POA: Diagnosis present

## 2014-12-10 DIAGNOSIS — E11319 Type 2 diabetes mellitus with unspecified diabetic retinopathy without macular edema: Secondary | ICD-10-CM | POA: Diagnosis present

## 2014-12-10 DIAGNOSIS — Z87891 Personal history of nicotine dependence: Secondary | ICD-10-CM | POA: Diagnosis not present

## 2014-12-10 DIAGNOSIS — I252 Old myocardial infarction: Secondary | ICD-10-CM

## 2014-12-10 DIAGNOSIS — E785 Hyperlipidemia, unspecified: Secondary | ICD-10-CM | POA: Diagnosis present

## 2014-12-10 DIAGNOSIS — R471 Dysarthria and anarthria: Secondary | ICD-10-CM | POA: Diagnosis present

## 2014-12-10 DIAGNOSIS — R29810 Facial weakness: Secondary | ICD-10-CM | POA: Diagnosis present

## 2014-12-10 DIAGNOSIS — Z794 Long term (current) use of insulin: Secondary | ICD-10-CM | POA: Diagnosis not present

## 2014-12-10 DIAGNOSIS — F329 Major depressive disorder, single episode, unspecified: Secondary | ICD-10-CM | POA: Diagnosis present

## 2014-12-10 DIAGNOSIS — Z992 Dependence on renal dialysis: Secondary | ICD-10-CM | POA: Diagnosis not present

## 2014-12-10 DIAGNOSIS — K5900 Constipation, unspecified: Secondary | ICD-10-CM | POA: Diagnosis present

## 2014-12-10 DIAGNOSIS — N189 Chronic kidney disease, unspecified: Secondary | ICD-10-CM

## 2014-12-10 DIAGNOSIS — I639 Cerebral infarction, unspecified: Secondary | ICD-10-CM | POA: Diagnosis not present

## 2014-12-10 DIAGNOSIS — D631 Anemia in chronic kidney disease: Secondary | ICD-10-CM | POA: Diagnosis present

## 2014-12-10 DIAGNOSIS — I509 Heart failure, unspecified: Secondary | ICD-10-CM | POA: Diagnosis present

## 2014-12-10 DIAGNOSIS — G459 Transient cerebral ischemic attack, unspecified: Secondary | ICD-10-CM

## 2014-12-10 DIAGNOSIS — E119 Type 2 diabetes mellitus without complications: Secondary | ICD-10-CM

## 2014-12-10 HISTORY — DX: End stage renal disease: N18.6

## 2014-12-10 HISTORY — DX: Dependence on renal dialysis: Z99.2

## 2014-12-10 LAB — COMPREHENSIVE METABOLIC PANEL
ALBUMIN: 3.7 g/dL (ref 3.5–5.2)
ALK PHOS: 282 U/L — AB (ref 39–117)
ALT: 25 U/L (ref 0–35)
ANION GAP: 13 (ref 5–15)
AST: 20 U/L (ref 0–37)
BILIRUBIN TOTAL: 0.7 mg/dL (ref 0.3–1.2)
BUN: 24 mg/dL — ABNORMAL HIGH (ref 6–23)
CALCIUM: 8.8 mg/dL (ref 8.4–10.5)
CO2: 26 mmol/L (ref 19–32)
Chloride: 100 mmol/L (ref 96–112)
Creatinine, Ser: 3.47 mg/dL — ABNORMAL HIGH (ref 0.50–1.10)
GFR, EST AFRICAN AMERICAN: 15 mL/min — AB (ref >90)
GFR, EST NON AFRICAN AMERICAN: 13 mL/min — AB (ref >90)
GLUCOSE: 161 mg/dL — AB (ref 70–99)
Potassium: 3.4 mmol/L — ABNORMAL LOW (ref 3.5–5.1)
SODIUM: 139 mmol/L (ref 135–145)
TOTAL PROTEIN: 7.8 g/dL (ref 6.0–8.3)

## 2014-12-10 LAB — I-STAT CHEM 8, ED
BUN: 27 mg/dL — AB (ref 6–23)
CALCIUM ION: 1.05 mmol/L — AB (ref 1.13–1.30)
Chloride: 100 mmol/L (ref 96–112)
Creatinine, Ser: 3.4 mg/dL — ABNORMAL HIGH (ref 0.50–1.10)
Glucose, Bld: 162 mg/dL — ABNORMAL HIGH (ref 70–99)
HEMATOCRIT: 35 % — AB (ref 36.0–46.0)
Hemoglobin: 11.9 g/dL — ABNORMAL LOW (ref 12.0–15.0)
Potassium: 3.5 mmol/L (ref 3.5–5.1)
Sodium: 140 mmol/L (ref 135–145)
TCO2: 24 mmol/L (ref 0–100)

## 2014-12-10 LAB — I-STAT TROPONIN, ED: Troponin i, poc: 0 ng/mL (ref 0.00–0.08)

## 2014-12-10 LAB — CBG MONITORING, ED: Glucose-Capillary: 175 mg/dL — ABNORMAL HIGH (ref 70–99)

## 2014-12-10 LAB — DIFFERENTIAL
Basophils Absolute: 0 10*3/uL (ref 0.0–0.1)
Basophils Relative: 0 % (ref 0–1)
EOS ABS: 0.4 10*3/uL (ref 0.0–0.7)
EOS PCT: 4 % (ref 0–5)
Lymphocytes Relative: 20 % (ref 12–46)
Lymphs Abs: 1.7 10*3/uL (ref 0.7–4.0)
MONOS PCT: 7 % (ref 3–12)
Monocytes Absolute: 0.6 10*3/uL (ref 0.1–1.0)
NEUTROS PCT: 69 % (ref 43–77)
Neutro Abs: 5.8 10*3/uL (ref 1.7–7.7)

## 2014-12-10 LAB — GLUCOSE, CAPILLARY
Glucose-Capillary: 205 mg/dL — ABNORMAL HIGH (ref 70–99)
Glucose-Capillary: 193 mg/dL — ABNORMAL HIGH (ref 70–99)

## 2014-12-10 LAB — CBC
HEMATOCRIT: 32.7 % — AB (ref 36.0–46.0)
Hemoglobin: 10.5 g/dL — ABNORMAL LOW (ref 12.0–15.0)
MCH: 28.5 pg (ref 26.0–34.0)
MCHC: 32.1 g/dL (ref 30.0–36.0)
MCV: 88.6 fL (ref 78.0–100.0)
Platelets: 271 10*3/uL (ref 150–400)
RBC: 3.69 MIL/uL — AB (ref 3.87–5.11)
RDW: 13.3 % (ref 11.5–15.5)
WBC: 8.5 10*3/uL (ref 4.0–10.5)

## 2014-12-10 LAB — APTT: APTT: 31 s (ref 24–37)

## 2014-12-10 LAB — PROTIME-INR
INR: 0.96 (ref 0.00–1.49)
PROTHROMBIN TIME: 12.9 s (ref 11.6–15.2)

## 2014-12-10 MED ORDER — INSULIN ASPART 100 UNIT/ML ~~LOC~~ SOLN
0.0000 [IU] | Freq: Every day | SUBCUTANEOUS | Status: DC
  Administered 2014-12-11: 2 [IU] via SUBCUTANEOUS

## 2014-12-10 MED ORDER — SENNOSIDES-DOCUSATE SODIUM 8.6-50 MG PO TABS: 1.0000 | ORAL_TABLET | Freq: Every evening | ORAL | Status: DC | PRN

## 2014-12-10 MED ORDER — ASPIRIN 300 MG RE SUPP: 300.0000 mg | Freq: Every day | RECTAL | Status: DC

## 2014-12-10 MED ORDER — INSULIN ASPART PROT & ASPART (70-30 MIX) 100 UNIT/ML ~~LOC~~ SUSP
65.0000 [IU] | Freq: Every day | SUBCUTANEOUS | Status: DC
  Filled 2014-12-10: qty 10

## 2014-12-10 MED ORDER — SEVELAMER CARBONATE 800 MG PO TABS
800.0000 mg | ORAL_TABLET | ORAL | Status: DC
  Administered 2014-12-13: 800 mg via ORAL
  Filled 2014-12-10 (×2): qty 1

## 2014-12-10 MED ORDER — STROKE: EARLY STAGES OF RECOVERY BOOK
Freq: Once | Status: AC
  Administered 2014-12-10: 13:00:00

## 2014-12-10 MED ORDER — CARVEDILOL 12.5 MG PO TABS
25.0000 mg | ORAL_TABLET | Freq: Two times a day (BID) | ORAL | Status: DC
  Administered 2014-12-10 – 2014-12-13 (×6): 25 mg via ORAL
  Filled 2014-12-10 (×6): qty 2

## 2014-12-10 MED ORDER — INSULIN ASPART 100 UNIT/ML ~~LOC~~ SOLN
0.0000 [IU] | Freq: Three times a day (TID) | SUBCUTANEOUS | Status: DC
  Administered 2014-12-10: 3 [IU] via SUBCUTANEOUS
  Administered 2014-12-11: 1 [IU] via SUBCUTANEOUS
  Administered 2014-12-12: 2 [IU] via SUBCUTANEOUS
  Administered 2014-12-13: 1 [IU] via SUBCUTANEOUS

## 2014-12-10 MED ORDER — PANTOPRAZOLE SODIUM 40 MG PO TBEC
40.0000 mg | DELAYED_RELEASE_TABLET | Freq: Every day | ORAL | Status: DC
  Administered 2014-12-10 – 2014-12-13 (×4): 40 mg via ORAL
  Filled 2014-12-10 (×4): qty 1

## 2014-12-10 MED ORDER — VITAMIN D 1000 UNITS PO TABS
2000.0000 [IU] | ORAL_TABLET | Freq: Every day | ORAL | Status: DC
  Administered 2014-12-10 – 2014-12-13 (×4): 2000 [IU] via ORAL
  Filled 2014-12-10 (×7): qty 2

## 2014-12-10 MED ORDER — CALCITRIOL 0.25 MCG PO CAPS
0.2500 ug | ORAL_CAPSULE | Freq: Every day | ORAL | Status: DC
Start: 2014-12-10 — End: 2014-12-12
  Administered 2014-12-10 – 2014-12-11 (×2): 0.25 ug via ORAL
  Filled 2014-12-10 (×3): qty 1

## 2014-12-10 MED ORDER — DOXAZOSIN MESYLATE 2 MG PO TABS
2.0000 mg | ORAL_TABLET | Freq: Every day | ORAL | Status: DC
  Filled 2014-12-10: qty 1

## 2014-12-10 MED ORDER — ACETAMINOPHEN 325 MG PO TABS
650.0000 mg | ORAL_TABLET | Freq: Once | ORAL | Status: AC
  Administered 2014-12-10: 650 mg via ORAL
  Filled 2014-12-10: qty 2

## 2014-12-10 MED ORDER — METOCLOPRAMIDE HCL 5 MG PO TABS
5.0000 mg | ORAL_TABLET | Freq: Three times a day (TID) | ORAL | Status: DC
  Administered 2014-12-10 – 2014-12-13 (×8): 5 mg via ORAL
  Filled 2014-12-10 (×8): qty 1

## 2014-12-10 MED ORDER — ASPIRIN 325 MG PO TABS
325.0000 mg | ORAL_TABLET | Freq: Every day | ORAL | Status: DC
  Administered 2014-12-10 – 2014-12-13 (×4): 325 mg via ORAL
  Filled 2014-12-10 (×4): qty 1

## 2014-12-10 MED ORDER — INSULIN GLARGINE 100 UNIT/ML ~~LOC~~ SOLN
45.0000 [IU] | Freq: Every day | SUBCUTANEOUS | Status: DC
  Administered 2014-12-10 – 2014-12-12 (×3): 45 [IU] via SUBCUTANEOUS
  Filled 2014-12-10 (×4): qty 0.45

## 2014-12-10 MED ORDER — HEPARIN SODIUM (PORCINE) 5000 UNIT/ML IJ SOLN
5000.0000 [IU] | Freq: Three times a day (TID) | INTRAMUSCULAR | Status: DC
  Administered 2014-12-10 – 2014-12-13 (×8): 5000 [IU] via SUBCUTANEOUS
  Filled 2014-12-10 (×9): qty 1

## 2014-12-10 MED ORDER — LISINOPRIL 10 MG PO TABS
10.0000 mg | ORAL_TABLET | Freq: Every evening | ORAL | Status: DC
  Administered 2014-12-10 – 2014-12-11 (×2): 10 mg via ORAL
  Filled 2014-12-10 (×2): qty 1

## 2014-12-10 MED ORDER — RENA-VITE PO TABS
1.0000 | ORAL_TABLET | Freq: Every day | ORAL | Status: DC
  Administered 2014-12-10 – 2014-12-13 (×4): 1 via ORAL
  Filled 2014-12-10 (×4): qty 1

## 2014-12-10 NOTE — Progress Notes (Signed)
Dialysis access needles pulled one at a time. Pressure held for 15 minutes each site. 4X4 gauze applied and secured with paper tape. Positive thrill present upon completion. Hayley,RN made aware to continue to monitor sites.

## 2014-12-10 NOTE — Code Documentation (Signed)
59 yo female arriving to [facility] via [place] at [phone].  EMS reports that the patient was at dialysis treatment when she became suddenly unresponsive.  Staff checked her BP which was approx. 50s/30s. Patient was given a fluid bolus with improvement in BP. Staff then noticed patient to have facial droop and weakness.  EMS reports left facial droop and progressive left sided weakness on arrival.  Labs drawn and patient to CT.  Stroke team to the bedside.  NIHSS 5, see documentation for details and code stroke times.  Patient exam showing right facial droop, stuttering speech and bilateral leg drift.  Dr. Doy Mince at the bedside.  Patient with improvement in speech and inconsistent facial assymmetry.  Patient is not a candidate for tPA at this time, however, she remains in the window to treat with tPA should symptoms worsen until 1255.  Bedside handoff with ED RNs Sarah and Cool.

## 2014-12-10 NOTE — ED Notes (Signed)
MD at bedside. 

## 2014-12-10 NOTE — Consult Note (Signed)
Renal Service Consult Note [facility]  [name] 12/10/2014 [place] Requesting Physician:  Dr Clementeen Graham   Reason for Consult:  ESRD pt passed out at HD today HPI: The patient is a 59 y.o. year-old with hx of longstanding DM2, retinopathy, HTN, ESRD on HD, hx TIA 2008, hx MI. She was at HD today and about 2 hours into Rx passed out associated w severe hypotension w BP in the 50's.  Pt was rinsed back and partially awakened with slurred speech and lethargy, possible some facial droop. Patient brought to hospital w initial NIHSS of 5.  Symptoms resolved in ED, seen by neuro who recommended MRI, ASA and swallow screen.    Patient does not remember passing out or coming to hospital.  Spoke w staff at HD unit, is it unusual for her to have severe hypotension on HD.  She doesn't miss treatments.  She had a 4kg wt gain today which is higher than usual, which would be in the low 2 kgs. Takes Cardura qpm, coreg bid and norvasc qpm.  BP's pre HD tend to be high around 140 - 180.    Currently no facial droop, speech issues or focal numbness or weakness     ROS  no CP, no SOB  no n/v/d  no palpitations  no abd pain  no jt pain  no flushing  Past Medical History  Past Medical History  Diagnosis Date  . Hypertension   . Hyperlipidemia   . Diabetes mellitus     diagnosed in 1988, on insulin.  . Anemia     2/2 renal failure  . Diabetic retinopathy     s/p PRP OS  . History of UTI   . Depression   . History of TIAs     10/08- twisted face and slurred speech without extremity weakness  . History of MI (myocardial infarction)   . Bleeding     History of retinal bleed on full dose aspirin  . S/P left heart catheterization by cutdown     July 2003, normal coronary arteries.  . Arthritis     ? Hands  . CHF (congestive heart failure)   . Myocardial infarction     per patient about 5 yrs ago  . Hyperparathyroidism   . Thyroid disease   . Pneumonia     hx  .  Obstructive sleep apnea     to have new sleep study 04/25/13 does not use cpap  . Heart murmur   . ESRD on hemodialysis     Started HD in 2015   Past Surgical History  Past Surgical History  Procedure Laterality Date  . Hernia repair    . Cholecystectomy    . Abdominal hysterectomy    . Umbical hernia    . Eye surgery      Laser DM   . Av fistula placement  12/05/2011    Procedure: ARTERIOVENOUS (AV) FISTULA CREATION;  Surgeon: Elam Dutch, MD;  Location: [facility] OR;  Service: Vascular;  Laterality: Right;  Creation of Arteriovenous fistula right lower arm   . Revison of arteriovenous fistula Right 04/17/2013    Procedure: REVISON OF ARTERIOVENOUS FISTULA and superficialization;  Surgeon: Elam Dutch, MD;  Location: [facility] OR;  Service: Vascular;  Laterality: Right;   Family History  Family History  Problem Relation Age of Onset  . Cancer Mother     lung smoker  . Diabetes Mother   . Hypertension Mother   . Heart  disease Mother     CHF, died at 83  . Hyperlipidemia Mother   . Heart attack Mother   . Diabetes Father   . Hypertension Father   . Heart disease Father     CHF, died at 62  . Hyperlipidemia Father   . Heart attack Father   . Cancer Sister     breast cancer  . Diabetes Son   . Heart disease Son   . Hypertension Son   . Hypertension Maternal Grandmother   . Hypertension Paternal Grandmother   . Colon cancer Neg Hx   . Anesthesia problems Neg Hx    Social History  reports that she quit smoking about 31 years ago. Her smoking use included Cigarettes. She has a 10 pack-year smoking history. She has never used smokeless tobacco. She reports that she does not drink alcohol or use illicit drugs. Allergies  Allergies  Allergen Reactions  . Codeine Nausea And Vomiting  . Hydrocodone Other (See Comments)    hallucinations  . Hydrocodone-Acetaminophen Nausea And Vomiting    Causes hallucination as well   Home medications Prior to Admission medications    Medication Sig Start Date End Date Taking? Authorizing Provider  amLODipine (NORVASC) 10 MG tablet Take 10 mg by mouth daily.     Yes Historical Provider, MD  carvedilol (COREG) 25 MG tablet Take 25 mg by mouth 2 (two) times daily with a meal.     Yes Historical Provider, MD  CVS D3 2000 UNITS CAPS Take 2,000 Units by mouth daily.  08/12/14  Yes Historical Provider, MD  doxazosin (CARDURA) 2 MG tablet Take 2 mg by mouth at bedtime.  08/11/14  Yes Historical Provider, MD  insulin aspart protamine-insulin aspart (NOVOLOG 70/30) (70-30) 100 UNIT/ML injection Inject 65 Units into the skin daily with breakfast.    Yes Historical Provider, MD  metoCLOPramide (REGLAN) 5 MG tablet Take 1 tablet (5 mg total) by mouth 3 (three) times daily before meals. 09/02/14  Yes Reyne Dumas, MD  multivitamin (RENA-VIT) TABS tablet Take 1 tablet by mouth daily.   Yes Historical Provider, MD  pantoprazole (PROTONIX) 40 MG tablet Take 1 tablet (40 mg total) by mouth daily. 12/02/14  Yes Sueanne Margarita, MD  RENVELA 800 MG tablet Take 800-2,400 mg by mouth See admin instructions. Take 3 tablets with meals and take 1 tablet with snacks 08/27/14  Yes Historical Provider, MD  epoetin alfa (PROCRIT) 57846 UNIT/ML injection Inject 20,000 Units into the skin every 14 (fourteen) days. In dialysis    Historical Provider, MD   Liver Function Tests  Recent Labs Lab 12/10/14 0857  AST 20  ALT 25  ALKPHOS 282*  BILITOT 0.7  PROT 7.8  ALBUMIN 3.7   No results for input(s): LIPASE, AMYLASE in the last 168 hours. CBC  Recent Labs Lab 12/10/14 0857 12/10/14 0909  WBC 8.5  --   NEUTROABS 5.8  --   HGB 10.5* 11.9*  HCT 32.7* 35.0*  MCV 88.6  --   PLT 271  --    Basic Metabolic Panel  Recent Labs Lab 12/10/14 0857 12/10/14 0909  NA 139 140  K 3.4* 3.5  CL 100 100  CO2 26  --   GLUCOSE 161* 162*  BUN 24* 27*  CREATININE 3.47* 3.40*  CALCIUM 8.8  --     Filed Vitals:   12/10/14 1132 12/10/14 1201 12/10/14  1208 12/10/14 1400  BP: 153/54  158/63 149/70  Pulse: 85  82 85  Temp:  98.6 F (37 C) 99 F (37.2 C)  TempSrc:   Oral Oral  Resp: 14  16 16   Weight:  90.357 kg (199 lb 3.2 oz)    SpO2: 98%  100% 98%   Exam Alert adult female, no distress, looks a bit tired No rash, cyanosis or gangrene Sclera anicteric, throat clear No jvd Chest clear bilat RRR no MRG Abd soft, NDNT, +BS, no HSM or ascites No LE or UE edema Right forearm AV fistula +bruit Neuro is nf, Ox 3, good strength x 4  HD: [facility] 3h 71min   350/600   81.5kg   2/2.0 [place]  Heparin 2000  RFA AVF Hect 3 ug TIW,  Aranesp 25 ug / wk   Assessment: 1. Syncopal episode - due to hypotension on HD this am.  On awakening had symptoms similar to prior TIA she has in 2008.  Seen by neuro, w/u in progress.  The only thing today that was different today was her fluid gain was unusually large at 4kg and that could have been a significant factor.  Also she takes 2 of her 3 BP meds at night including Cardura which is associated w postural hypotension, and Norvasc , which can slow stomach emptying in pt's w gastroparesis. For now will keep on coreg, stop norvasc and Cardura, substitute lisinopril.  2. Hypotension - on HD.  Takes cardura and norvasc qhs, and coreg bid. BP'd up pre HD typically 3. ESRD on HD MWF - got half of HD today 4. Anemia on aranesp 5. MBD cont vit D, renvela 6. DM longstanding w retinopathy 7. Hist of diab gastroparesis - will change CCB to ACEi as this problem has been a significant clinical issue per pt 8. Volume - no vol excess on exam.  Wt's are probably wrong, saying 8-9kg up   Plan - no further HD planned for today.   Kelly Splinter MD (pgr) [phone]    [phone] 12/10/2014, 3:37 PM

## 2014-12-10 NOTE — ED Provider Notes (Signed)
CSN: [id number]     Arrival date & time 12/10/14  [id number] History   First MD Initiated Contact with Patient 12/10/14 0901     Chief Complaint  Patient presents with  . Code Stroke     (Consider location/radiation/quality/duration/timing/severity/associated sxs/prior Treatment) HPI Comments: Patient presents to the ER by ambulance from dialysis. A code stroke was initiated by EMS. Patient reportedly had a syncopal episode during dialysis. She was hypotensive, given a fluid bolus. After the bolus, blood pressure improved and she became awake and alert, but was noted to have facial drooping.  EMS report that she had left facial droop and left arm and leg weakness. She had dysarthria and upon arrival, is having difficulty speaking to give any further information. Level V Caveat due to acuity of condition/speech difficulty.   Past Medical History  Diagnosis Date  . Hypertension   . Hyperlipidemia   . Diabetes mellitus     diagnosed in 1988, on insulin.  . Anemia     2/2 renal failure  . Diabetic retinopathy     s/p PRP OS  . History of UTI   . Depression   . History of TIAs     10/08- twisted face and slurred speech without extremity weakness  . History of MI (myocardial infarction)   . Bleeding     History of retinal bleed on full dose aspirin  . S/P left heart catheterization by cutdown     July 2003, normal coronary arteries.  . Arthritis     ? Hands  . CHF (congestive heart failure)   . Myocardial infarction     per patient about 5 yrs ago  . Palpitations   . Chest pain   . Hyperparathyroidism   . Thyroid disease   . Shortness of breath     exersion  . Pneumonia     hx  . Obstructive sleep apnea     to have new sleep study 04/25/13 does not use cpap  . Stroke     tia hx  . Heart murmur   . Chronic kidney disease     Recently started HD   Past Surgical History  Procedure Laterality Date  . Hernia repair    . Cholecystectomy    . Abdominal hysterectomy    .  Umbical hernia    . Eye surgery      Laser DM   . Av fistula placement  12/05/2011    Procedure: ARTERIOVENOUS (AV) FISTULA CREATION;  Surgeon: Elam Dutch, MD;  Location: [facility] OR;  Service: Vascular;  Laterality: Right;  Creation of Arteriovenous fistula right lower arm   . Revison of arteriovenous fistula Right 04/17/2013    Procedure: REVISON OF ARTERIOVENOUS FISTULA and superficialization;  Surgeon: Elam Dutch, MD;  Location: [facility] OR;  Service: Vascular;  Laterality: Right;   Family History  Problem Relation Age of Onset  . Cancer Mother     lung smoker  . Diabetes Mother   . Hypertension Mother   . Heart disease Mother     CHF, died at 61  . Hyperlipidemia Mother   . Heart attack Mother   . Diabetes Father   . Hypertension Father   . Heart disease Father     CHF, died at 51  . Hyperlipidemia Father   . Heart attack Father   . Cancer Sister     breast cancer  . Diabetes Son   . Heart disease Son   . Hypertension Son   .  Hypertension Maternal Grandmother   . Hypertension Paternal Grandmother   . Colon cancer Neg Hx   . Anesthesia problems Neg Hx    History  Substance Use Topics  . Smoking status: Former Smoker -- 1.00 packs/day for 10 years    Types: Cigarettes    Quit date: 01/12/1983  . Smokeless tobacco: Never Used  . Alcohol Use: No   OB History    No data available     Review of Systems  Unable to perform ROS: Acuity of condition      Allergies  Codeine; Hydrocodone; and Hydrocodone-acetaminophen  Home Medications   Prior to Admission medications   Medication Sig Start Date End Date Taking? Authorizing Provider  amLODipine (NORVASC) 10 MG tablet Take 10 mg by mouth daily.      Historical Provider, MD  calcitRIOL (ROCALTROL) 0.25 MCG capsule Take 0.25 mcg by mouth Daily. 11/16/11   Historical Provider, MD  carvedilol (COREG) 25 MG tablet Take 25 mg by mouth 2 (two) times daily with a meal.      Historical Provider, MD  CVS D3 2000 UNITS CAPS  Take 2,000 Units by mouth daily.  08/12/14   Historical Provider, MD  doxazosin (CARDURA) 2 MG tablet Take 2 mg by mouth at bedtime.  08/11/14   Historical Provider, MD  epoetin alfa (PROCRIT) 57846 UNIT/ML injection Inject 20,000 Units into the skin every 14 (fourteen) days. In dialysis    Historical Provider, MD  insulin aspart protamine-insulin aspart (NOVOLOG 70/30) (70-30) 100 UNIT/ML injection Inject 65 Units into the skin daily with breakfast.     Historical Provider, MD  metoCLOPramide (REGLAN) 5 MG tablet Take 1 tablet (5 mg total) by mouth 3 (three) times daily before meals. 09/02/14   Reyne Dumas, MD  multivitamin (RENA-VIT) TABS tablet Take 1 tablet by mouth daily.    Historical Provider, MD  pantoprazole (PROTONIX) 40 MG tablet Take 1 tablet (40 mg total) by mouth daily. 12/02/14   Sueanne Margarita, MD  RENVELA 800 MG tablet Take 800-2,400 mg by mouth See admin instructions. Take 3 tablets with meals and take 1 tablet with snacks 08/27/14   Historical Provider, MD   BP 148/58 mmHg  Pulse 83  Temp(Src) 98 F (36.7 C) (Oral)  Resp 14  SpO2 95% Physical Exam  Constitutional: She appears well-developed and well-nourished. She appears distressed.  HENT:  Head: Normocephalic and atraumatic.  Right Ear: Hearing normal.  Left Ear: Hearing normal.  Nose: Nose normal.  Mouth/Throat: Oropharynx is clear and moist and mucous membranes are normal.  Eyes: Conjunctivae and EOM are normal. Pupils are equal, round, and reactive to light.  Neck: Normal range of motion. Neck supple.  Cardiovascular: Regular rhythm, S1 normal and S2 normal.  Exam reveals no gallop and no friction rub.   No murmur heard. Pulmonary/Chest: Effort normal and breath sounds normal. No respiratory distress. She exhibits no tenderness.  Abdominal: Soft. Normal appearance and bowel sounds are normal. There is no hepatosplenomegaly. There is no tenderness. There is no rebound, no guarding, no tenderness at McBurney's point  and negative Murphy's sign. No hernia.  Musculoskeletal: Normal range of motion.  Neurological: She is alert. She has normal strength. A cranial nerve deficit (Right facial droop) is present. No sensory deficit. Coordination normal. GCS eye subscore is 4. GCS verbal subscore is 5. GCS motor subscore is 6.  Patient appears to be oriented, but is having difficulty with speech. She presents with a strange stuttering type dysarthria but eventually  can answer questions.  Patient noted to have a right facial droop.  She has equal grip strength bilaterally, poor effort but equal strength in the lower extremities bilaterally  Skin: Skin is warm, dry and intact. No rash noted. No cyanosis.  Psychiatric: She has a normal mood and affect. Her speech is normal and behavior is normal. Thought content normal.  Nursing note and vitals reviewed.   ED Course  Procedures (including critical care time) Labs Review Labs Reviewed  CBC - Abnormal; Notable for the following:    RBC 3.69 (*)    Hemoglobin 10.5 (*)    HCT 32.7 (*)    All other components within normal limits  COMPREHENSIVE METABOLIC PANEL - Abnormal; Notable for the following:    Potassium 3.4 (*)    Glucose, Bld 161 (*)    BUN 24 (*)    Creatinine, Ser 3.47 (*)    Alkaline Phosphatase 282 (*)    GFR calc non Af Amer 13 (*)    GFR calc Af Amer 15 (*)    All other components within normal limits  CBG MONITORING, ED - Abnormal; Notable for the following:    Glucose-Capillary 175 (*)    All other components within normal limits  I-STAT CHEM 8, ED - Abnormal; Notable for the following:    BUN 27 (*)    Creatinine, Ser 3.40 (*)    Glucose, Bld 162 (*)    Calcium, Ion 1.05 (*)    Hemoglobin 11.9 (*)    HCT 35.0 (*)    All other components within normal limits  PROTIME-INR  APTT  DIFFERENTIAL  I-STAT TROPOININ, ED    Imaging Review Ct Head (brain) Wo Contrast  12/10/2014   CLINICAL DATA:  Possible ongoing stroke  EXAM: CT HEAD  WITHOUT CONTRAST  TECHNIQUE: Contiguous axial images were obtained from the base of the skull through the vertex without intravenous contrast.  COMPARISON:  11/12/2013  FINDINGS: The bony calvarium is intact. No gross soft tissue abnormality is noted. No acute hemorrhage, acute infarction or space-occupying mass lesion is identified. Mild atrophic changes are noted.  IMPRESSION: Mild atrophic changes.  No acute abnormality is noted.   Electronically Signed   By: Inez Catalina M.D.   On: 12/10/2014 09:15     EKG Interpretation   Date/Time:  Wednesday December 10 2014 09:11:27 EST Ventricular Rate:  88 PR Interval:  135 QRS Duration: 94 QT Interval:  385 QTC Calculation: 466 R Axis:   36 Text Interpretation:  Sinus rhythm Abnormal R-wave progression, early  transition No significant change since last tracing Confirmed by Kayleeann Huxford   MD, Asha Grumbine [phone]) on 12/10/2014 9:26:18 AM      MDM   Final diagnoses:  Syncope, unspecified syncope type  Transient cerebral ischemia, unspecified transient cerebral ischemia type    Patient presents to the ER by ambulance from dialysis as a code stroke. Patient had a syncopal episode during dialysis. There was associated hypotension which resolved with fluid bolus. After the syncopal episode, however, patient had right facial droop and dysarthria. She has had similar presentations in the past felt to be TIA.  She was managed in conjunction with neurology. Deficit was unusual. TPA was not recommended. CAT scan did not show any acute intracranial abnormality. Observation for syncope and possible TIA was recommended.    Orpah Greek, MD 12/10/14 [phone]

## 2014-12-10 NOTE — Consult Note (Signed)
Referring Physician: Pollina    Chief Complaint: Syncope  HPI: [name] is an 59 y.o. female who was at dialysis today and was noted to have a syncopal episode.  Her BP was low at that time and a bolus was given.  EMS was called and felt that there was a left sided facial droop and left sided weakness.  Speech was slurred.  Code stroke was called and patient was brought to the ED for evaluation.  Initial NIHSS of 5.   From review of the chart has had these spells in the past.  Work up including a MRI of the brain and carotid dopplers was unremarkable.  EEG was unremarkable as well.  Patient was placed on an ASA daily but per review of her medications this was not continued.    Date last known well: Date: 12/10/2014 Time last known well: Time: 08:25 tPA Given: No: Resolution of symptoms  Past Medical History  Diagnosis Date  . Hypertension   . Hyperlipidemia   . Diabetes mellitus     diagnosed in 1988, on insulin.  . Anemia     2/2 renal failure  . Diabetic retinopathy     s/p PRP OS  . History of UTI   . Depression   . History of TIAs     10/08- twisted face and slurred speech without extremity weakness  . History of MI (myocardial infarction)   . Bleeding     History of retinal bleed on full dose aspirin  . S/P left heart catheterization by cutdown     July 2003, normal coronary arteries.  . Arthritis     ? Hands  . CHF (congestive heart failure)   . Myocardial infarction     per patient about 5 yrs ago  . Palpitations   . Chest pain   . Hyperparathyroidism   . Thyroid disease   . Shortness of breath     exersion  . Pneumonia     hx  . Obstructive sleep apnea     to have new sleep study 04/25/13 does not use cpap  . Stroke     tia hx  . Heart murmur   . Chronic kidney disease     Recently started HD    Past Surgical History  Procedure Laterality Date  . Hernia repair    . Cholecystectomy    . Abdominal hysterectomy    . Umbical hernia    . Eye surgery       Laser DM   . Av fistula placement  12/05/2011    Procedure: ARTERIOVENOUS (AV) FISTULA CREATION;  Surgeon: Elam Dutch, MD;  Location: [facility] OR;  Service: Vascular;  Laterality: Right;  Creation of Arteriovenous fistula right lower arm   . Revison of arteriovenous fistula Right 04/17/2013    Procedure: REVISON OF ARTERIOVENOUS FISTULA and superficialization;  Surgeon: Elam Dutch, MD;  Location: [facility] OR;  Service: Vascular;  Laterality: Right;    Family History  Problem Relation Age of Onset  . Cancer Mother     lung smoker  . Diabetes Mother   . Hypertension Mother   . Heart disease Mother     CHF, died at 72  . Hyperlipidemia Mother   . Heart attack Mother   . Diabetes Father   . Hypertension Father   . Heart disease Father     CHF, died at 78  . Hyperlipidemia Father   . Heart attack Father   . Cancer Sister  breast cancer  . Diabetes Son   . Heart disease Son   . Hypertension Son   . Hypertension Maternal Grandmother   . Hypertension Paternal Grandmother   . Colon cancer Neg Hx   . Anesthesia problems Neg Hx    Social History:  reports that she quit smoking about 31 years ago. Her smoking use included Cigarettes. She has a 10 pack-year smoking history. She has never used smokeless tobacco. She reports that she does not drink alcohol or use illicit drugs.  Allergies:  Allergies  Allergen Reactions  . Codeine Nausea And Vomiting  . Hydrocodone Other (See Comments)    hallucinations  . Hydrocodone-Acetaminophen Nausea And Vomiting    Causes hallucination as well    Medications: I have reviewed the patient's current medications. Prior to Admission:  Current outpatient prescriptions:  .  amLODipine (NORVASC) 10 MG tablet, Take 10 mg by mouth daily.  , Disp: , Rfl:  .  calcitRIOL (ROCALTROL) 0.25 MCG capsule, Take 0.25 mcg by mouth Daily., Disp: , Rfl:  .  carvedilol (COREG) 25 MG tablet, Take 25 mg by mouth 2 (two) times daily with a meal.  , Disp: , Rfl:  .   CVS D3 2000 UNITS CAPS, Take 2,000 Units by mouth daily. , Disp: , Rfl: 2 .  doxazosin (CARDURA) 2 MG tablet, Take 2 mg by mouth at bedtime. , Disp: , Rfl: 3 .  epoetin alfa (PROCRIT) 60454 UNIT/ML injection, Inject 20,000 Units into the skin every 14 (fourteen) days. In dialysis, Disp: , Rfl:  .  insulin aspart protamine-insulin aspart (NOVOLOG 70/30) (70-30) 100 UNIT/ML injection, Inject 65 Units into the skin daily with breakfast. , Disp: , Rfl:  .  metoCLOPramide (REGLAN) 5 MG tablet, Take 1 tablet (5 mg total) by mouth 3 (three) times daily before meals., Disp: 90 tablet, Rfl: 2 .  multivitamin (RENA-VIT) TABS tablet, Take 1 tablet by mouth daily., Disp: , Rfl:  .  pantoprazole (PROTONIX) 40 MG tablet, Take 1 tablet (40 mg total) by mouth daily., Disp: 30 tablet, Rfl: 11 .  RENVELA 800 MG tablet, Take 800-2,400 mg by mouth See admin instructions. Take 3 tablets with meals and take 1 tablet with snacks, Disp: , Rfl:   ROS: Unable to obtain  Physical Examination: Blood pressure 148/58, pulse 83, temperature 98 F (36.7 C), temperature source Oral, resp. rate 14, SpO2 95 %.  HEENT-  Normocephalic, no lesions, without obvious abnormality.  Normal external eye and conjunctiva.  Normal TM's bilaterally.  Normal auditory canals and external ears. Normal external nose, mucus membranes and septum.  Normal pharynx. Cardiovascular- S1, S2 normal, pulses palpable throughout   Lungs- chest clear, no wheezing, rales, normal symmetric air entry Abdomen- soft, non-tender; bowel sounds normal; no masses,  no organomegaly Extremities- no edema Lymph-no adenopathy palpable Musculoskeletal-no joint tenderness, deformity or swelling Skin-warm and dry, no hyperpigmentation, vitiligo, or suspicious lesions  Neurological Examination Mental Status: Alert, oriented, thought content appropriate.  Speech slurred with stuttering initially.  With time became fluent.   Able to follow 3 step commands without  difficulty. Cranial Nerves: II: Discs flat bilaterally; Visual fields grossly normal, pupils equal, round, reactive to light and accommodation III,IV, VI: ptosis not present, extra-ocular motions intact bilaterally V,VII: mild right facial droop with talking, facial light touch sensation normal bilaterally VIII: hearing normal bilaterally IX,X: gag reflex present XI: bilateral shoulder shrug XII: midline tongue extension Motor: Right : Upper extremity   5/5    Left:  Upper extremity   5/5  Lower extremity   5/5     Lower extremity   5/5 Tone and bulk:normal tone throughout; no atrophy noted Sensory: Pinprick and light touch intact throughout, bilaterally Deep Tendon Reflexes: 2+ and symmetric throughout with absent AJ's bilaterally Plantars: Right: downgoing   Left: downgoing Cerebellar: normal finger-to-nose and normal heel-to-shin testing bilaterally Gait: unable to test secondary to hypotension   Laboratory Studies:  Basic Metabolic Panel:  Recent Labs Lab 12/10/14 0857 12/10/14 0909  NA 139 140  K 3.4* 3.5  CL 100 100  CO2 26  --   GLUCOSE 161* 162*  BUN 24* 27*  CREATININE 3.47* 3.40*  CALCIUM 8.8  --     Liver Function Tests:  Recent Labs Lab 12/10/14 0857  AST 20  ALT 25  ALKPHOS 282*  BILITOT 0.7  PROT 7.8  ALBUMIN 3.7   No results for input(s): LIPASE, AMYLASE in the last 168 hours. No results for input(s): AMMONIA in the last 168 hours.  CBC:  Recent Labs Lab 12/10/14 0857 12/10/14 0909  WBC 8.5  --   NEUTROABS 5.8  --   HGB 10.5* 11.9*  HCT 32.7* 35.0*  MCV 88.6  --   PLT 271  --     Cardiac Enzymes: No results for input(s): CKTOTAL, CKMB, CKMBINDEX, TROPONINI in the last 168 hours.  BNP: Invalid input(s): POCBNP  CBG:  Recent Labs Lab 12/10/14 0925  GLUCAP 175*    Microbiology: Results for orders placed or performed during the hospital encounter of 11/24/13  Urine culture     Status: None   Collection Time: 11/24/13   3:39 PM  Result Value Ref Range Status   Specimen Description URINE, CLEAN CATCH  Final   Special Requests NONE  Final   Culture  Setup Time   Final    11/24/2013 22:05 Performed at [facility]   Final    >=100,000 COLONIES/ML Performed at [facility]   Final    ESCHERICHIA COLI Performed at [facility]   Report Status 11/26/2013 FINAL  Final   Organism ID, Bacteria ESCHERICHIA COLI  Final      Susceptibility   Escherichia coli - MIC*    AMPICILLIN <=2 SENSITIVE Sensitive     CEFAZOLIN <=4 SENSITIVE Sensitive     CEFTRIAXONE <=1 SENSITIVE Sensitive     CIPROFLOXACIN <=0.25 SENSITIVE Sensitive     GENTAMICIN <=1 SENSITIVE Sensitive     LEVOFLOXACIN <=0.12 SENSITIVE Sensitive     NITROFURANTOIN <=16 SENSITIVE Sensitive     TOBRAMYCIN <=1 SENSITIVE Sensitive     TRIMETH/SULFA <=20 SENSITIVE Sensitive     PIP/TAZO <=4 SENSITIVE Sensitive     * ESCHERICHIA COLI    Coagulation Studies:  Recent Labs  12/10/14 0857  LABPROT 12.9  INR 0.96    Urinalysis: No results for input(s): COLORURINE, LABSPEC, PHURINE, GLUCOSEU, HGBUR, BILIRUBINUR, KETONESUR, PROTEINUR, UROBILINOGEN, NITRITE, LEUKOCYTESUR in the last 168 hours.  Invalid input(s): APPERANCEUR  Lipid Panel:    Component Value Date/Time   CHOL 286* 05/28/2010 0017   TRIG 149 05/28/2010 0017   HDL 48 05/28/2010 0017   CHOLHDL 6.0 Ratio 05/28/2010 0017   VLDL 30 05/28/2010 0017   LDLCALC 208* 05/28/2010 0017    HgbA1C:  Lab Results  Component Value Date   HGBA1C 7.8 05/27/2010    Urine Drug Screen:      Component Value Date/Time   LABOPIA NONE DETECTED 12/21/2009  [place] 12/21/2009 1429   LABBENZ NONE DETECTED 12/21/2009 1429   AMPHETMU NONE DETECTED 12/21/2009 1429   THCU NONE DETECTED 12/21/2009 1429   LABBARB  12/21/2009 1429    NONE DETECTED        DRUG SCREEN FOR MEDICAL PURPOSES ONLY.  IF CONFIRMATION IS NEEDED FOR ANY  PURPOSE, NOTIFY LAB WITHIN 5 DAYS.        LOWEST DETECTABLE LIMITS FOR URINE DRUG SCREEN Drug Class       Cutoff (ng/mL) Amphetamine      1000 Barbiturate      200 Benzodiazepine   [id number] Tricyclics       [id number] Opiates          300 Cocaine          300 THC              50    Alcohol Level: No results for input(s): ETH in the last 168 hours.  Other results: EKG: sinus rhythm at 88 bpm.  Imaging: Ct Head (brain) Wo Contrast  12/10/2014   CLINICAL DATA:  Possible ongoing stroke  EXAM: CT HEAD WITHOUT CONTRAST  TECHNIQUE: Contiguous axial images were obtained from the base of the skull through the vertex without intravenous contrast.  COMPARISON:  11/12/2013  FINDINGS: The bony calvarium is intact. No gross soft tissue abnormality is noted. No acute hemorrhage, acute infarction or space-occupying mass lesion is identified. Mild atrophic changes are noted.  IMPRESSION: Mild atrophic changes.  No acute abnormality is noted.   Electronically Signed   By: Inez Catalina M.D.   On: 12/10/2014 09:15    Assessment: 59 y.o. female presenting with an episode of slurred speech and facial droop.  Patient has had similar events in the past.  Work in the past has been unremarkable.  Patient has now returned to baseline.  She reports that she was sleeping and does not know what happened.  Head CT reviewed and unremarkable.  Patient with multiple risk factors and on no antiplatelet therapy.    Stroke Risk Factors - diabetes mellitus, hyperlipidemia and hypertension  Plan: 1. HgbA1c, fasting lipid panel 2. MRI of the brain without contrast 3. Prophylactic therapy-Antiplatelet med: Aspirin - dose 325mg  daily 4. NPO until RN stroke swallow screen 5. Telemetry monitoring 6. Frequent neuro checks  Case discussed with Dr. Charlann Noss, MD [facility] [phone] 12/10/2014, 10:02 AM  Addendum: MRI of the brain personally reviewed and shows no acute changes.  Symptoms resolved.     Recommendations: 1.  ASA 325mg  daily 2.  Telemetry monitoring 3.  Frequent neuro checks 4.  EEG  Alexis Goodell, MD [facility] [phone]
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
## 2014-12-10 NOTE — H&P (Addendum)
[facility] History and Physical  [name] [id number] DOB: 10-28-1955 DOA: 12/10/2014  Referring physician: Dr Betsey Holiday PCP: Marlou Sa ERIC, MD   Primary nephrologist: Dr Servando Salina  Chief Complaint:   sent from HD following syncope and possible stroke  HPI:  59 year old female with history of end-stage renal disease on dialysis (M, W, F), diabetes mellitus on insulin, hypertension, hyperlipidemia, anemia of chronic disease, depression, history of TIAs, ? History of MI, depression, arthritis was sent from dialysis Center for syncopal event while at dialysis. She was reportedly hypotensive and given a fluid bolus after which her blood pressure improved and patient was awake and alert but had a facial droop. As per ER as she had left facial droop and left arm and leg weakness. On arrival to the ED she had dysarthria as well. Patient does not remember the incident. Reports that she only completed 2 hours of her dialysis today.  Patient denies headache, dizziness, fever, chills, nausea , vomiting, chest pain, palpitations, SOB, abdominal pain, bowel symptoms. Denies change in weight or appetite.   Course in the ED Code stroke was called. Neuro hospitalist was consulted. Head CT on admission was negative for acute findings. Patient had regained her speech and had no further missing the ED subsequently. An MRI of the brain was ordered and patient admitted to hospitalist service. Patient was seen by neurology 1 year back after being deferred for TIA-like symptoms and had MRI of her brain and EEG which was unremarkable.  Review of Systems:  Constitutional: Denies fever, chills, diaphoresis, appetite change and fatigue.  HEENT: Denies photophobia, eye pain, hearing loss, ear pain, congestion, sore throat,, trouble swallowing, neck pain, neck stiffness and tinnitus.   Respiratory: Denies SOB, DOE, cough, chest tightness,  and wheezing.   Cardiovascular: Denies chest pain, palpitations and  leg swelling.  Gastrointestinal: Denies nausea, vomiting, abdominal pain, diarrhea, constipation, blood in stool and abdominal distention.  Genitourinary: Denies   hematuria, flank pain  Endocrine: Denies: hot or cold intolerance,   Musculoskeletal: Denies myalgias, back pain, joint pain or swelling Skin: Denies  rash and wound.  Neurological: Syncope, dysarthria, facial droop, left-sided weakness Denies dizziness, seizures,   light-headedness, numbness and headaches.  Hematological: Denies adenopathy.  Psychiatric/Behavioral: Denies confusion   Past Medical History  Diagnosis Date  . Hypertension   . Hyperlipidemia   . Diabetes mellitus     diagnosed in 1988, on insulin.  . Anemia     2/2 renal failure  . Diabetic retinopathy     s/p PRP OS  . History of UTI   . Depression   . History of TIAs     10/08- twisted face and slurred speech without extremity weakness  . History of MI (myocardial infarction)   . Bleeding     History of retinal bleed on full dose aspirin  . S/P left heart catheterization by cutdown     July 2003, normal coronary arteries.  . Arthritis     ? Hands  . CHF (congestive heart failure)   . Myocardial infarction     per patient about 5 yrs ago  . Palpitations   . Chest pain   . Hyperparathyroidism   . Thyroid disease   . Shortness of breath     exersion  . Pneumonia     hx  . Obstructive sleep apnea     to have new sleep study 04/25/13 does not use cpap  . Stroke     tia hx  . Heart  murmur   . Chronic kidney disease     Recently started HD   Past Surgical History  Procedure Laterality Date  . Hernia repair    . Cholecystectomy    . Abdominal hysterectomy    . Umbical hernia    . Eye surgery      Laser DM   . Av fistula placement  12/05/2011    Procedure: ARTERIOVENOUS (AV) FISTULA CREATION;  Surgeon: Elam Dutch, MD;  Location: [facility] OR;  Service: Vascular;  Laterality: Right;  Creation of Arteriovenous fistula right lower arm   .  Revison of arteriovenous fistula Right 04/17/2013    Procedure: REVISON OF ARTERIOVENOUS FISTULA and superficialization;  Surgeon: Elam Dutch, MD;  Location: [facility];  Service: Vascular;  Laterality: Right;   Social History:  reports that she quit smoking about 31 years ago. Her smoking use included Cigarettes. She has a 10 pack-year smoking history. She has never used smokeless tobacco. She reports that she does not drink alcohol or use illicit drugs.  Allergies  Allergen Reactions  . Codeine Nausea And Vomiting  . Hydrocodone Other (See Comments)    hallucinations  . Hydrocodone-Acetaminophen Nausea And Vomiting    Causes hallucination as well    Family History  Problem Relation Age of Onset  . Cancer Mother     lung smoker  . Diabetes Mother   . Hypertension Mother   . Heart disease Mother     CHF, died at 64  . Hyperlipidemia Mother   . Heart attack Mother   . Diabetes Father   . Hypertension Father   . Heart disease Father     CHF, died at 68  . Hyperlipidemia Father   . Heart attack Father   . Cancer Sister     breast cancer  . Diabetes Son   . Heart disease Son   . Hypertension Son   . Hypertension Maternal Grandmother   . Hypertension Paternal Grandmother   . Colon cancer Neg Hx   . Anesthesia problems Neg Hx     Prior to Admission medications   Medication Sig Start Date End Date Taking? Authorizing Provider  amLODipine (NORVASC) 10 MG tablet Take 10 mg by mouth daily.     Yes Historical Provider, MD  carvedilol (COREG) 25 MG tablet Take 25 mg by mouth 2 (two) times daily with a meal.     Yes Historical Provider, MD  [employer] D3 2000 UNITS CAPS Take 2,000 Units by mouth daily.  08/12/14  Yes Historical Provider, MD  doxazosin (CARDURA) 2 MG tablet Take 2 mg by mouth at bedtime.  08/11/14  Yes Historical Provider, MD  insulin aspart protamine-insulin aspart (NOVOLOG 70/30) (70-30) 100 UNIT/ML injection Inject 65 Units into the skin daily with breakfast.    Yes  Historical Provider, MD  metoCLOPramide (REGLAN) 5 MG tablet Take 1 tablet (5 mg total) by mouth 3 (three) times daily before meals. 09/02/14  Yes Reyne Dumas, MD  multivitamin (RENA-VIT) TABS tablet Take 1 tablet by mouth daily.   Yes Historical Provider, MD  pantoprazole (PROTONIX) 40 MG tablet Take 1 tablet (40 mg total) by mouth daily. 12/02/14  Yes Sueanne Margarita, MD  RENVELA 800 MG tablet Take 800-2,400 mg by mouth See admin instructions. Take 3 tablets with meals and take 1 tablet with snacks 08/27/14  Yes Historical Provider, MD  calcitRIOL (ROCALTROL) 0.25 MCG capsule Take 0.25 mcg by mouth Daily. 11/16/11   Historical Provider, MD  epoetin alfa (PROCRIT)  10000 UNIT/ML injection Inject 20,000 Units into the skin every 14 (fourteen) days. In dialysis    Historical Provider, MD     Physical Exam:  Filed Vitals:   12/10/14 1000 12/10/14 1012 12/10/14 1019 12/10/14 1025  BP: 177/67 167/71  169/69  Pulse: 89   88  Temp:  98.3 F (36.8 C) 98.3 F (36.8 C)   TempSrc:  Oral    Resp: 15 23  21   SpO2: 99% 97%  99%    Constitutional: Vital signs reviewed.  Elderly female lying in bed in no acute distress HEENT: no pallor, no icterus, moist oral mucosa, no cervical lymphadenopathy, supple neck Cardiovascular: RRR, S1 normal, S2 normal, no MRG Chest: CTAB, no wheezes, rales, or rhonchi GI: Soft. Non-tender, non-distended, bowel sounds are normal, Musculoskeletal: warm, no edema, right upper extremity AV fistula  Neurological: A&O x3, cranial nerves II-12 intact, normal motor tone throughout and reflexes, normal sensations,  Labs on Admission:  Basic Metabolic Panel:  Recent Labs Lab 12/10/14 0857 12/10/14 0909  NA 139 140  K 3.4* 3.5  CL 100 100  CO2 26  --   GLUCOSE 161* 162*  BUN 24* 27*  CREATININE 3.47* 3.40*  CALCIUM 8.8  --    Liver Function Tests:  Recent Labs Lab 12/10/14 0857  AST 20  ALT 25  ALKPHOS 282*  BILITOT 0.7  PROT 7.8  ALBUMIN 3.7   No results  for input(s): LIPASE, AMYLASE in the last 168 hours. No results for input(s): AMMONIA in the last 168 hours. CBC:  Recent Labs Lab 12/10/14 0857 12/10/14 0909  WBC 8.5  --   NEUTROABS 5.8  --   HGB 10.5* 11.9*  HCT 32.7* 35.0*  MCV 88.6  --   PLT 271  --    Cardiac Enzymes: No results for input(s): CKTOTAL, CKMB, CKMBINDEX, TROPONINI in the last 168 hours. BNP: Invalid input(s): POCBNP CBG:  Recent Labs Lab 12/10/14 0925  GLUCAP 175*    Radiological Exams on Admission: Ct Head (brain) Wo Contrast  12/10/2014   CLINICAL DATA:  Possible ongoing stroke  EXAM: CT HEAD WITHOUT CONTRAST  TECHNIQUE: Contiguous axial images were obtained from the base of the skull through the vertex without intravenous contrast.  COMPARISON:  11/12/2013  FINDINGS: The bony calvarium is intact. No gross soft tissue abnormality is noted. No acute hemorrhage, acute infarction or space-occupying mass lesion is identified. Mild atrophic changes are noted.  IMPRESSION: Mild atrophic changes.  No acute abnormality is noted.   Electronically Signed   By: Inez Catalina M.D.   On: 12/10/2014 09:15    EKG: Normal Sinus rhythm at 88, no ST-T changes  Assessment/Plan Principal Problem:  TIA versus CVA (cerebral vascular accident)  Active Problems:   Diabetes mellitus   Anemia of chronic renal failure   HTN (hypertension)   ESRD (end stage renal disease)   OSA (obstructive sleep apnea)   Syncope     Acute CVA/ TIA Admit to telemetry  neuro checks q4 hr Head CT on admission negative for acute event. Follow MRI brain, MRA head. -Check 2D echo, carotid doppler, A1C and lipid panel -swallow eval, PT, OT. -ASA 325 mg daily, statin. Allow permissive HTN -Follow-up with neurology   ESRD on HD  will notify renal continue renvela Received procrit   Anemia of CKD  received procrit as outpt   Diabetes mellitus Check A1C, monitor fsg. Will place on lantus 45 units at bedtime. continue  SSI  Diet:cardiac/ diabetic  DVT prophylaxis: sq heparin  Code Status: full code Family Communication: None at bedside Disposition Plan: admit to telemetry.  Louellen Molder [facility] Pager [phone]  Total time spent on admission :60 minutes  If 7PM-7AM, please contact night-coverage [email or web address] Password [email or web address] 12/10/2014, 11:13 AM

## 2014-12-10 NOTE — ED Notes (Signed)
Pt arrived via [facility] as a Code Stroke from dialysis for L sided facial droop and left sided weakness. Pt was having dialysis, became unresponsive, BP was 50s/30s , a 500cc bolus was given.  After bolus BP was 156/59, which was when the facial droop and weakness was noticed by staff.  CBG 160 BP-164/90 P-88 NSR O2-98% RA.  Last known well by dialysis staff at [facility].    Marland Kitchen

## 2014-12-10 NOTE — ED Notes (Signed)
Attempted report 

## 2014-12-10 NOTE — ED Notes (Signed)
Patient transported to MRI 

## 2014-12-11 ENCOUNTER — Ambulatory Visit (HOSPITAL_COMMUNITY): Payer: Medicare Other

## 2014-12-11 DIAGNOSIS — I10 Essential (primary) hypertension: Secondary | ICD-10-CM | POA: Diagnosis not present

## 2014-12-11 DIAGNOSIS — E1122 Type 2 diabetes mellitus with diabetic chronic kidney disease: Secondary | ICD-10-CM

## 2014-12-11 DIAGNOSIS — N189 Chronic kidney disease, unspecified: Secondary | ICD-10-CM

## 2014-12-11 DIAGNOSIS — R2990 Unspecified symptoms and signs involving the nervous system: Secondary | ICD-10-CM

## 2014-12-11 LAB — LIPID PANEL
CHOLESTEROL: 151 mg/dL (ref 0–200)
HDL: 39 mg/dL — AB (ref >39)
LDL Cholesterol: 90 mg/dL (ref 0–99)
TRIGLYCERIDES: 109 mg/dL (ref <150)
Total CHOL/HDL Ratio: 3.9 RATIO
VLDL: 22 mg/dL (ref 0–40)

## 2014-12-11 LAB — GLUCOSE, CAPILLARY
GLUCOSE-CAPILLARY: 112 mg/dL — AB (ref 70–99)
GLUCOSE-CAPILLARY: 104 mg/dL — AB (ref 70–99)
Glucose-Capillary: 131 mg/dL — ABNORMAL HIGH (ref 70–99)
Glucose-Capillary: 229 mg/dL — ABNORMAL HIGH (ref 70–99)

## 2014-12-11 MED ORDER — POLYETHYLENE GLYCOL 3350 17 G PO PACK
17.0000 g | PACK | Freq: Every day | ORAL | Status: DC
  Administered 2014-12-11 – 2014-12-12 (×2): 17 g via ORAL
  Filled 2014-12-11 (×3): qty 1

## 2014-12-11 MED ORDER — BISACODYL 10 MG RE SUPP: 10.0000 mg | Freq: Every day | RECTAL | Status: DC | PRN

## 2014-12-11 MED ORDER — TRAMADOL HCL 50 MG PO TABS
25.0000 mg | ORAL_TABLET | Freq: Four times a day (QID) | ORAL | Status: DC | PRN
  Administered 2014-12-11 – 2014-12-12 (×3): 25 mg via ORAL
  Filled 2014-12-11 (×3): qty 1

## 2014-12-11 MED ORDER — DOCUSATE SODIUM 100 MG PO CAPS
200.0000 mg | ORAL_CAPSULE | Freq: Every day | ORAL | Status: DC
  Administered 2014-12-11 – 2014-12-12 (×2): 200 mg via ORAL
  Filled 2014-12-11 (×2): qty 2

## 2014-12-11 NOTE — Evaluation (Signed)
Occupational Therapy Evaluation Patient Details Name: [name] MRN: [id number] DOB: 1956-03-31 Today's Date: 12/11/2014    History of Present Illness Patient is a 59 y/o female who was sent from HD secondary to syncope and possible stroke. PMH of ESRD on dialysis (M, W, F), diabetes mellitus on insulin, HTN, HLD, anemia of chronic disease, depression, history of TIAs and? History of MI.  Pt hypotensive and per ER she had left facial droop and left arm and leg weakness. On arrival to the ED she had dysarthria as well. MRI head (-). CT head (-).   Clinical Impression   Pt admitted with above. She demonstrates the below listed deficits and will benefit from continued OT to maximize safety and independence with BADLs.  Pt presents to OT with generalized weakness. She did have orthostatic hypotension with complaint of being lightheaded with standing.   She does report h/o falls that she reports are preceded by feeling light headed. Discussed need for tub transfer bench at home.  Will continue to follow acutely.    BP supine 149/65;  sitting 149/65 Standing 111/49 Standing after 3 mins 127/51 At end of session 160/69      Follow Up Recommendations  No OT follow up;Supervision/Assistance - 24 hour    Equipment Recommendations  Tub/shower bench    Recommendations for Other Services       Precautions / Restrictions Precautions Precautions: Fall Precaution Comments: orthostatic hypotension      Mobility Bed Mobility Overal bed mobility: Modified Independent                Transfers Overall transfer level: Needs assistance   Transfers: Sit to/from Stand;Stand Pivot Transfers Sit to Stand: Min guard Stand pivot transfers: Min guard       General transfer comment: Pt with orthostatic hypotension - c/o mild lightheadedness     Balance Overall balance assessment: Needs assistance Sitting-balance support: Feet supported Sitting balance-Leahy Scale: Good      Standing balance support: During functional activity Standing balance-Leahy Scale: Good                              ADL Overall ADL's : Needs assistance/impaired Eating/Feeding: Independent   Grooming: Wash/dry hands;Wash/dry face;Oral care;Brushing hair;Min guard;Standing   Upper Body Bathing: Set up;Sitting   Lower Body Bathing: Min guard;Sit to/from stand   Upper Body Dressing : Set up;Sitting   Lower Body Dressing: Min guard;Sit to/from stand   Toilet Transfer: Min guard;Ambulation;Comfort height toilet   Toileting- Clothing Manipulation and Hygiene: Min guard;Sit to/from stand       Functional mobility during ADLs: Min guard General ADL Comments: Discussed options for tub DME with pt.        Vision     Perception     Praxis      Pertinent Vitals/Pain Pain Assessment: 0-10 Pain Score: 8  Pain Location: headache  Pain Descriptors / Indicators: Aching Pain Intervention(s): Monitored during session;RN gave pain meds during session     Hand Dominance Right   Extremity/Trunk Assessment Upper Extremity Assessment Upper Extremity Assessment: Overall WFL for tasks assessed   Lower Extremity Assessment Lower Extremity Assessment: Defer to PT evaluation   Cervical / Trunk Assessment Cervical / Trunk Assessment: Normal   Communication Communication Communication: No difficulties   Cognition Arousal/Alertness: Awake/alert Behavior During Therapy: WFL for tasks assessed/performed Overall Cognitive Status: Within Functional Limits for tasks assessed  General Comments       Exercises       Shoulder Instructions      Home Living Family/patient expects to be discharged to:: Private residence Living Arrangements: Spouse/significant other;Children Available Help at Discharge: Family;Available PRN/intermittently Type of Home: House Home Access: Level entry     Home Layout: One level     Bathroom Shower/Tub:  Tub/shower unit Shower/tub characteristics: Architectural technologist: Standard     Home Equipment: Cane - single point          Prior Functioning/Environment Level of Independence: Independent with assistive device(s)        Comments: Drives to dialysis, uses SPC PRN. Reports multiple falls in bathtub and sometimes "out of no where"    OT Diagnosis: Generalized weakness   OT Problem List: Decreased strength;Impaired balance (sitting and/or standing);Decreased activity tolerance;Decreased knowledge of use of DME or AE;Pain   OT Treatment/Interventions: Self-care/ADL training;DME and/or AE instruction;Therapeutic activities;Balance training;Patient/family education    OT Goals(Current goals can be found in the care plan section) Acute Rehab OT Goals Patient Stated Goal: to go home  OT Goal Formulation: With patient Time For Goal Achievement: 12/25/14 Potential to Achieve Goals: Good ADL Goals Pt Will Perform Grooming: with modified independence;standing Pt Will Perform Upper Body Bathing: with modified independence;sitting Pt Will Perform Lower Body Bathing: with modified independence;sit to/from stand Pt Will Perform Upper Body Dressing: with modified independence;sitting Pt Will Perform Lower Body Dressing: with modified independence;sit to/from stand Pt Will Transfer to Toilet: with modified independence;ambulating;regular height toilet;grab bars Pt Will Perform Toileting - Clothing Manipulation and hygiene: with modified independence;sit to/from stand Pt Will Perform Tub/Shower Transfer: Tub transfer;with modified independence;ambulating;tub bench  OT Frequency: Min 2X/week   Barriers to D/C:            Co-evaluation              End of Session Nurse Communication: Mobility status;Patient requests pain meds  Activity Tolerance: Patient tolerated treatment well Patient left: with call bell/phone within reach;in chair   Time: [id number] OT Time Calculation  (min): 31 min Charges:  OT General Charges $OT Visit: 1 Procedure OT Evaluation $Initial OT Evaluation Tier I: 1 Procedure OT Treatments $Self Care/Home Management : 8-22 mins G-Codes:    Rosio Weiss M 06-Jan-2015, 5:52 PM

## 2014-12-11 NOTE — Progress Notes (Signed)
Subjective: No further episodes while in hospital.  Patient now feels that she is back to baseline.    Objective: Current vital signs: BP 147/67 mmHg  Pulse 72  Temp(Src) 98.2 F (36.8 C) (Oral)  Resp 16  Ht 5\' 4"  (1.626 m)  Wt 79.561 kg (175 lb 6.4 oz)  BMI 30.09 kg/m2  SpO2 95% Vital signs in last 24 hours: Temp:  [98.1 F (36.7 C)-99.1 F (37.3 C)] 98.2 F (36.8 C) (02/25 0400) Pulse Rate:  [72-87] 72 (02/25 0400) Resp:  [16] 16 (02/25 0400) BP: (136-161)/(54-70) 147/67 mmHg (02/25 0400) SpO2:  [93 %-100 %] 95 % (02/25 0400) Weight:  [79.561 kg (175 lb 6.4 oz)-90.357 kg (199 lb 3.2 oz)] 79.561 kg (175 lb 6.4 oz) (02/24 1743)  Intake/Output from previous day: 02/24 0701 - 02/25 0700 In: 360 [P.O.:360] Out: -  Intake/Output this shift: Total I/O In: 120 [P.O.:120] Out: -  Nutritional status: Diet renal/carb modified with 1200 ml fluid restriction  Neurologic Exam: Mental Status: Alert, oriented, thought content appropriate. Speech fluent. Able to follow 3 step commands without difficulty. Cranial Nerves: II: Discs flat bilaterally; Visual fields grossly normal, pupils equal, round, reactive to light and accommodation III,IV, VI: ptosis not present, extra-ocular motions intact bilaterally V,VII: face symmetric, facial light touch sensation normal bilaterally VIII: hearing normal bilaterally IX,X: gag reflex present XI: bilateral shoulder shrug XII: midline tongue extension Motor: Right :Upper extremity 5/5Left: Upper extremity 5/5 Lower extremity 5/5Lower extremity 5/5 Tone and bulk:normal tone throughout; no atrophy noted Sensory: Pinprick and light touch intact throughout, bilaterally Deep Tendon Reflexes: 2+ and symmetric throughout with absent AJ's bilaterally Plantars: Right: downgoingLeft:  downgoing Cerebellar: normal finger-to-nose and normal heel-to-shin testing bilaterally   Lab Results: Basic Metabolic Panel:  Recent Labs Lab 12/10/14 0857 12/10/14 0909  NA 139 140  K 3.4* 3.5  CL 100 100  CO2 26  --   GLUCOSE 161* 162*  BUN 24* 27*  CREATININE 3.47* 3.40*  CALCIUM 8.8  --     Liver Function Tests:  Recent Labs Lab 12/10/14 0857  AST 20  ALT 25  ALKPHOS 282*  BILITOT 0.7  PROT 7.8  ALBUMIN 3.7   No results for input(s): LIPASE, AMYLASE in the last 168 hours. No results for input(s): AMMONIA in the last 168 hours.  CBC:  Recent Labs Lab 12/10/14 0857 12/10/14 0909  WBC 8.5  --   NEUTROABS 5.8  --   HGB 10.5* 11.9*  HCT 32.7* 35.0*  MCV 88.6  --   PLT 271  --     Cardiac Enzymes: No results for input(s): CKTOTAL, CKMB, CKMBINDEX, TROPONINI in the last 168 hours.  Lipid Panel:  Recent Labs Lab 12/11/14 0401  CHOL 151  TRIG 109  HDL 39*  CHOLHDL 3.9  VLDL 22  LDLCALC 90    CBG:  Recent Labs Lab 12/10/14 0925 12/10/14 1630 12/10/14 2118 12/11/14 0631  GLUCAP 175* 205* 193* 112*    Microbiology: Results for orders placed or performed during the hospital encounter of 11/24/13  Urine culture     Status: None   Collection Time: 11/24/13  3:39 PM  Result Value Ref Range Status   Specimen Description URINE, CLEAN CATCH  Final   Special Requests NONE  Final   Culture  Setup Time   Final    11/24/2013 22:05 Performed at [facility]   Final    >=100,000 COLONIES/ML Performed at [facility]  Final    ESCHERICHIA COLI Performed at [facility]   Report Status 11/26/2013 FINAL  Final   Organism ID, Bacteria ESCHERICHIA COLI  Final      Susceptibility   Escherichia coli - MIC*    AMPICILLIN <=2 SENSITIVE Sensitive     CEFAZOLIN <=4 SENSITIVE Sensitive     CEFTRIAXONE <=1 SENSITIVE Sensitive     CIPROFLOXACIN <=0.25 SENSITIVE Sensitive     GENTAMICIN <=1 SENSITIVE  Sensitive     LEVOFLOXACIN <=0.12 SENSITIVE Sensitive     NITROFURANTOIN <=16 SENSITIVE Sensitive     TOBRAMYCIN <=1 SENSITIVE Sensitive     TRIMETH/SULFA <=20 SENSITIVE Sensitive     PIP/TAZO <=4 SENSITIVE Sensitive     * ESCHERICHIA COLI    Coagulation Studies:  Recent Labs  12/10/14 0857  LABPROT 12.9  INR 0.96    Imaging: Ct Head (brain) Wo Contrast  12/10/2014   CLINICAL DATA:  Possible ongoing stroke  EXAM: CT HEAD WITHOUT CONTRAST  TECHNIQUE: Contiguous axial images were obtained from the base of the skull through the vertex without intravenous contrast.  COMPARISON:  11/12/2013  FINDINGS: The bony calvarium is intact. No gross soft tissue abnormality is noted. No acute hemorrhage, acute infarction or space-occupying mass lesion is identified. Mild atrophic changes are noted.  IMPRESSION: Mild atrophic changes.  No acute abnormality is noted.   Electronically Signed   By: Inez Catalina M.D.   On: 12/10/2014 09:15   Mr Brain Wo Contrast  12/10/2014   CLINICAL DATA:  Code stroke from dialysis with LEFT facial droop and LEFT-sided weakness earlier today. Low blood pressure.  EXAM: MRI HEAD WITHOUT CONTRAST  TECHNIQUE: Multiplanar, multiecho pulse sequences of the brain and surrounding structures were obtained without intravenous contrast.  COMPARISON:  CT head earlier today.  FINDINGS: No evidence for acute infarction, hemorrhage, mass lesion, hydrocephalus, or extra-axial fluid. Premature for age cerebral and cerebellar atrophy. Mild subcortical and periventricular T2 and FLAIR hyperintensities, likely chronic microvascular ischemic change. Flow voids are maintained throughout the carotid, basilar, and vertebral arteries. There are no areas of chronic hemorrhage. Pituitary, pineal, and cerebellar tonsils unremarkable. No upper cervical lesions. Visualized calvarium, skull base, and upper cervical osseous structures unremarkable. Scalp and extracranial soft tissues, orbits, sinuses, and  mastoids show no acute process.  IMPRESSION: Chronic changes as described. No evidence for acute cerebral infarction or other significant intracranial finding.   Electronically Signed   By: Rolla Flatten M.D.   On: 12/10/2014 11:35    Medications:  Scheduled: . aspirin  300 mg Rectal Daily   Or  . aspirin  325 mg Oral Daily  . calcitRIOL  0.25 mcg Oral Daily  . carvedilol  25 mg Oral BID WC  . cholecalciferol  2,000 Units Oral Daily  . heparin  5,000 Units Subcutaneous 3 times per day  . insulin aspart  0-5 Units Subcutaneous QHS  . insulin aspart  0-9 Units Subcutaneous TID WC  . insulin glargine  45 Units Subcutaneous QHS  . lisinopril  10 mg Oral QPM  . metoCLOPramide  5 mg Oral TID AC  . multivitamin  1 tablet Oral Daily  . pantoprazole  40 mg Oral Daily  . sevelamer carbonate  800-2,400 mg Oral See admin instructions   Etta Quill PA-C [facility] [phone]  12/11/2014, 11:53 AM  Patient seen and examined.  Clinical course and management discussed.  Necessary edits performed.  I agree with the above.  Assessment and plan of care developed and  discussed below.    Assessment/Plan: 59 YO female who had a syncopal episode with resultant left facial droop and slurred speech.  Now back to baseline.  Patient with multiple previous similar episodes. Work up negative in the past.  Echo shows no intracardiac masses or thrombi.     EEG pending.   Recommendations: 1.  Will follow up EEG results.  If unremarkable, patient to continue follow up on an outpatient basis 2.  Continue ASA   Alexis Goodell, MD [facility] [phone]  12/11/2014  1:15 PM

## 2014-12-11 NOTE — Evaluation (Signed)
Physical Therapy Evaluation Patient Details Name: [name] MRN: [id number] DOB: 1956-02-13 Today's Date: 12/11/2014   History of Present Illness  Patient is a 59 y/o female who was sent from HD secondary to syncope and possible stroke. PMH of ESRD on dialysis (M, W, F), diabetes mellitus on insulin, HTN, HLD, anemia of chronic disease, depression, history of TIAs and? History of MI.  Pt hypotensive and per ER she had left facial droop and left arm and leg weakness. On arrival to the ED she had dysarthria as well. MRI head (-). CT head (-).    Clinical Impression  Patient presents with generalized weakness, orthostatic hypotension and mild balance deficits impacting mobility. Pt reports fall history not always associated with warning signs or symptoms. Pt became symptomatic upon standing today which reflected with abnormal drop in BP. Education provided on taking time between changes in position due to blood pressure changes to assist decrease falls. Pt would benefit from skilled PT to improve gait and higher level balance so pt can minimize fall risk and maximize independence prior to return home.    Follow Up Recommendations Supervision - Intermittent    Equipment Recommendations  None recommended by PT    Recommendations for Other Services OT consult     Precautions / Restrictions Precautions Precautions: Fall Precaution Comments: orthostatic hypotension Restrictions Weight Bearing Restrictions: No      Mobility  Bed Mobility Overal bed mobility: Needs Assistance Bed Mobility: Supine to Sit;Sit to Supine     Supine to sit: Modified independent (Device/Increase time) Sit to supine: Modified independent (Device/Increase time)   General bed mobility comments: Use of rails for support. Supine to/from sit x2.  Transfers Overall transfer level: Needs assistance Equipment used: None Transfers: Sit to/from Stand Sit to Stand: Min guard         General transfer comment:  Min guard due to reports of dizziness with changes in position. Stood from [employer].   Ambulation/Gait Ambulation/Gait assistance: Min guard Ambulation Distance (Feet): 250 Feet Assistive device: None Gait Pattern/deviations: Step-through pattern;Decreased stride length;Drifts right/left   Gait velocity interpretation: Below normal speed for age/gender General Gait Details: Pt with slow, mildly unsteady gait with head turns and higher balance challenges. No overt LOB. Dyspnea present.   Stairs            Wheelchair Mobility    Modified Rankin (Stroke Patients Only)       Balance Overall balance assessment: Needs assistance;History of Falls Sitting-balance support: Feet supported;No upper extremity supported Sitting balance-Leahy Scale: Good     Standing balance support: During functional activity Standing balance-Leahy Scale: Good               High level balance activites: Direction changes;Turns;Sudden stops;Head turns High Level Balance Comments: tolerated above challenges with mild deviations in gait path and drifting but no overt LOB.             Pertinent Vitals/Pain Pain Assessment: No/denies pain    Home Living Family/patient expects to be discharged to:: Private residence Living Arrangements: Spouse/significant other;Children Available Help at Discharge: Family;Available PRN/intermittently Type of Home: House Home Access: Level entry     Home Layout: One level Home Equipment: Cane - single point      Prior Function Level of Independence: Independent with assistive device(s)         Comments: Drives to dialysis, uses SPC PRN. Reports multiple falls in bathtub and sometimes "out of no where"     Hand Dominance  Extremity/Trunk Assessment   Upper Extremity Assessment: Defer to OT evaluation           Lower Extremity Assessment: Generalized weakness;RLE deficits/detail         Communication   Communication: No  difficulties  Cognition Arousal/Alertness: Awake/alert Behavior During Therapy: WFL for tasks assessed/performed Overall Cognitive Status: Within Functional Limits for tasks assessed                      General Comments General comments (skin integrity, edema, etc.): Orthostatic vitals:  Supine  151/63  98%  81 bpm   Sitting 142/61  100%   83 bpm   Standing 116/57  100%   84 bpm symptomatic with dizziness upon standing.    Exercises        Assessment/Plan    PT Assessment Patient needs continued PT services  PT Diagnosis Difficulty walking;Generalized weakness   PT Problem List Decreased strength;Cardiopulmonary status limiting activity;Decreased activity tolerance;Decreased balance;Decreased mobility;Impaired sensation  PT Treatment Interventions Balance training;Gait training;Patient/family education;Functional mobility training;Therapeutic activities;Therapeutic exercise;Neuromuscular re-education   PT Goals (Current goals can be found in the Care Plan section) Acute Rehab PT Goals Patient Stated Goal: none stated PT Goal Formulation: With patient Time For Goal Achievement: 12/25/14 Potential to Achieve Goals: Fair    Frequency Min 3X/week   Barriers to discharge        Co-evaluation               End of Session Equipment Utilized During Treatment: Gait belt Activity Tolerance: Patient limited by fatigue Patient left: in bed;with call bell/phone within reach;with bed alarm set;with family/visitor present Nurse Communication: Mobility status         Time: 1017-1040 PT Time Calculation (min) (ACUTE ONLY): 23 min   Charges:   PT Evaluation $Initial PT Evaluation Tier I: 1 Procedure PT Treatments $Gait Training: 8-22 mins   PT G CodesCandy Sledge A Dec 28, 2014, 10:50 AM  Candy Sledge, PT, DPT [phone]

## 2014-12-11 NOTE — Progress Notes (Signed)
*  PRELIMINARY RESULTS* Vascular Ultrasound Carotid Duplex (Doppler) has been completed.   Findings suggest 1-39% internal carotid artery stenosis bilaterally. Vertebral arteries are patent with antegrade flow.  12/11/2014 3:20 PM Maudry Mayhew, RVT, RDCS, RDMS

## 2014-12-11 NOTE — Progress Notes (Signed)
SLP Cancellation Note  Patient Details Name: TYRE YANTA MRN: [id number] DOB: 06-19-1956   Cancelled treatment:       Reason Eval/Treat Not Completed: SLP screened, no needs identified, will sign off   Juan Quam Laurice 12/11/2014, 3:04 PM

## 2014-12-11 NOTE — Progress Notes (Signed)
[facility] PROGRESS NOTE  [name] [id number] DOB: 1956/02/17 DOA: 12/10/2014 PCP: Kevan Ny, MD  Assessment/Plan: 1. Syncopal episode- with resultant left facial droop and slurred speech, now resolved neurology following. EEG is pending. 2. Constipation- patient was taking MiraLAX at home. Will start MiraLAX 17 g by mouth daily along with Dulcolax suppository when necessary 3. End-stage renal disease- patient is on hemodialysis, nephrology following 4. Diabetes mellitus- continue Lantus 45 units at bedtime and sliding scale insulin. 5. Hypertension- patient has history of hypertension, positive orthostatic hypotension with 30 points drop in the systolic blood pressure on standing. Her medications have been adjusted as per nephrology.  Code Status: Full code Family Communication: *No family at bedside Disposition Plan: Home when stable   Consultants:  Nephrology  Procedures:  None*  Antibiotics:  None  HPI/Subjective: *59 year old female with a history of end-stage renal disease on dialysis Monday Wednesday Friday, diabetes mellitus on insulin, hypertension, hyperlipidemia, anemia of chronic disease, depression, history of TIA who came to the ED after patient had syncopal event while at dialysis. Patient was hypotensive and was given food bolus after which blood pressure improved and patient was awake and alert but had facial droop. Patient also had dysarthria.  Patient symptoms have resolved. Today completes of constipation.  Objective: Filed Vitals:   12/11/14 1452  BP: 149/65  Pulse: 83  Temp: 99 F (37.2 C)  Resp: 16    Intake/Output Summary (Last 24 hours) at 12/11/14 1710 Last data filed at 12/11/14 0837  Gross per 24 hour  Intake    480 ml  Output      0 ml  Net    480 ml   Filed Weights   12/10/14 1201 12/10/14 1743  Weight: 90.357 kg (199 lb 3.2 oz) 79.561 kg (175 lb 6.4 oz)    Exam:   General:  Appearing no acute  distress  Cardiovascular: S1-S2 regular  Respiratory: Clear bilaterally  Abdomen: Soft nontender no organomegaly  Musculoskeletal: *No edema  Data Reviewed: Basic Metabolic Panel:  Recent Labs Lab 12/10/14 0857 12/10/14 0909  NA 139 140  K 3.4* 3.5  CL 100 100  CO2 26  --   GLUCOSE 161* 162*  BUN 24* 27*  CREATININE 3.47* 3.40*  CALCIUM 8.8  --    Liver Function Tests:  Recent Labs Lab 12/10/14 0857  AST 20  ALT 25  ALKPHOS 282*  BILITOT 0.7  PROT 7.8  ALBUMIN 3.7   No results for input(s): LIPASE, AMYLASE in the last 168 hours. No results for input(s): AMMONIA in the last 168 hours. CBC:  Recent Labs Lab 12/10/14 0857 12/10/14 0909  WBC 8.5  --   NEUTROABS 5.8  --   HGB 10.5* 11.9*  HCT 32.7* 35.0*  MCV 88.6  --   PLT 271  --    Cardiac Enzymes: No results for input(s): CKTOTAL, CKMB, CKMBINDEX, TROPONINI in the last 168 hours. BNP (last 3 results) No results for input(s): BNP in the last 8760 hours.  ProBNP (last 3 results) No results for input(s): PROBNP in the last 8760 hours.  CBG:  Recent Labs Lab 12/10/14 0925 12/10/14 1630 12/10/14 2118 12/11/14 0631 12/11/14 1148  GLUCAP 175* 205* 193* 112* 131*    No results found for this or any previous visit (from the past 240 hour(s)).   Studies: Ct Head (brain) Wo Contrast  12/10/2014   CLINICAL DATA:  Possible ongoing stroke  EXAM: CT HEAD WITHOUT CONTRAST  TECHNIQUE: Contiguous axial images  were obtained from the base of the skull through the vertex without intravenous contrast.  COMPARISON:  11/12/2013  FINDINGS: The bony calvarium is intact. No gross soft tissue abnormality is noted. No acute hemorrhage, acute infarction or space-occupying mass lesion is identified. Mild atrophic changes are noted.  IMPRESSION: Mild atrophic changes.  No acute abnormality is noted.   Electronically Signed   By: Inez Catalina M.D.   On: 12/10/2014 09:15   Mr Brain Wo Contrast  12/10/2014   CLINICAL DATA:   Code stroke from dialysis with LEFT facial droop and LEFT-sided weakness earlier today. Low blood pressure.  EXAM: MRI HEAD WITHOUT CONTRAST  TECHNIQUE: Multiplanar, multiecho pulse sequences of the brain and surrounding structures were obtained without intravenous contrast.  COMPARISON:  CT head earlier today.  FINDINGS: No evidence for acute infarction, hemorrhage, mass lesion, hydrocephalus, or extra-axial fluid. Premature for age cerebral and cerebellar atrophy. Mild subcortical and periventricular T2 and FLAIR hyperintensities, likely chronic microvascular ischemic change. Flow voids are maintained throughout the carotid, basilar, and vertebral arteries. There are no areas of chronic hemorrhage. Pituitary, pineal, and cerebellar tonsils unremarkable. No upper cervical lesions. Visualized calvarium, skull base, and upper cervical osseous structures unremarkable. Scalp and extracranial soft tissues, orbits, sinuses, and mastoids show no acute process.  IMPRESSION: Chronic changes as described. No evidence for acute cerebral infarction or other significant intracranial finding.   Electronically Signed   By: Rolla Flatten M.D.   On: 12/10/2014 11:35    Scheduled Meds: . aspirin  300 mg Rectal Daily   Or  . aspirin  325 mg Oral Daily  . calcitRIOL  0.25 mcg Oral Daily  . carvedilol  25 mg Oral BID WC  . cholecalciferol  2,000 Units Oral Daily  . heparin  5,000 Units Subcutaneous 3 times per day  . insulin aspart  0-5 Units Subcutaneous QHS  . insulin aspart  0-9 Units Subcutaneous TID WC  . insulin glargine  45 Units Subcutaneous QHS  . lisinopril  10 mg Oral QPM  . metoCLOPramide  5 mg Oral TID AC  . multivitamin  1 tablet Oral Daily  . pantoprazole  40 mg Oral Daily  . polyethylene glycol  17 g Oral Daily  . sevelamer carbonate  800-2,400 mg Oral See admin instructions   Continuous Infusions:   Principal Problem:   CVA (cerebral vascular accident) Active Problems:   Diabetes mellitus    Anemia of chronic renal failure   HTN (hypertension)   ESRD (end stage renal disease)   OSA (obstructive sleep apnea)   Syncope    Time spent: 25 min    [facility] Pager [phone]. If 7PM-7AM, please contact night-coverage at [email or web address], password [facility] 12/11/2014, 5:10 PM  LOS: 1 day

## 2014-12-11 NOTE — Progress Notes (Signed)
New medication education done with the patient.  Information sheet given to the patient.  Will continue to monitor. Cori Razor, RN

## 2014-12-11 NOTE — Progress Notes (Signed)
Subjective:  Feeling much better, no complaints, no dyspnea  Objective: Vital signs in last 24 hours: Temp:  [98 F (36.7 C)-99.1 F (37.3 C)] 98.2 F (36.8 C) (02/25 0400) Pulse Rate:  [72-89] 72 (02/25 0400) Resp:  [14-23] 16 (02/25 0400) BP: (136-177)/(54-71) 147/67 mmHg (02/25 0400) SpO2:  [93 %-100 %] 95 % (02/25 0400) Weight:  [79.561 kg (175 lb 6.4 oz)-90.357 kg (199 lb 3.2 oz)] 79.561 kg (175 lb 6.4 oz) (02/24 1743) Weight change:   Intake/Output from previous day: 02/24 0701 - 02/25 0700 In: 360 [P.O.:360] Out: -  Intake/Output this shift: Total I/O In: 120 [P.O.:120] Out: -   Lab Results:  Recent Labs  12/10/14 0857 12/10/14 0909  WBC 8.5  --   HGB 10.5* 11.9*  HCT 32.7* 35.0*  PLT 271  --    BMET:  Recent Labs  12/10/14 0857 12/10/14 0909  NA 139 140  K 3.4* 3.5  CL 100 100  CO2 26  --   GLUCOSE 161* 162*  BUN 24* 27*  CREATININE 3.47* 3.40*  CALCIUM 8.8  --   ALBUMIN 3.7  --    No results for input(s): PTH in the last 72 hours. Iron Studies: No results for input(s): IRON, TIBC, TRANSFERRIN, FERRITIN in the last 72 hours.  Studies/Results: Ct Head (brain) Wo Contrast  12/10/2014   CLINICAL DATA:  Possible ongoing stroke  EXAM: CT HEAD WITHOUT CONTRAST  TECHNIQUE: Contiguous axial images were obtained from the base of the skull through the vertex without intravenous contrast.  COMPARISON:  11/12/2013  FINDINGS: The bony calvarium is intact. No gross soft tissue abnormality is noted. No acute hemorrhage, acute infarction or space-occupying mass lesion is identified. Mild atrophic changes are noted.  IMPRESSION: Mild atrophic changes.  No acute abnormality is noted.   Electronically Signed   By: Inez Catalina M.D.   On: 12/10/2014 09:15   Mr Brain Wo Contrast  12/10/2014   CLINICAL DATA:  Code stroke from dialysis with LEFT facial droop and LEFT-sided weakness earlier today. Low blood pressure.  EXAM: MRI HEAD WITHOUT CONTRAST  TECHNIQUE: Multiplanar,  multiecho pulse sequences of the brain and surrounding structures were obtained without intravenous contrast.  COMPARISON:  CT head earlier today.  FINDINGS: No evidence for acute infarction, hemorrhage, mass lesion, hydrocephalus, or extra-axial fluid. Premature for age cerebral and cerebellar atrophy. Mild subcortical and periventricular T2 and FLAIR hyperintensities, likely chronic microvascular ischemic change. Flow voids are maintained throughout the carotid, basilar, and vertebral arteries. There are no areas of chronic hemorrhage. Pituitary, pineal, and cerebellar tonsils unremarkable. No upper cervical lesions. Visualized calvarium, skull base, and upper cervical osseous structures unremarkable. Scalp and extracranial soft tissues, orbits, sinuses, and mastoids show no acute process.  IMPRESSION: Chronic changes as described. No evidence for acute cerebral infarction or other significant intracranial finding.   Electronically Signed   By: Rolla Flatten M.D.   On: 12/10/2014 11:35   Inpatient Medications: . aspirin  300 mg Rectal Daily   Or  . aspirin  325 mg Oral Daily  . calcitRIOL  0.25 mcg Oral Daily  . carvedilol  25 mg Oral BID WC  . cholecalciferol  2,000 Units Oral Daily  . heparin  5,000 Units Subcutaneous 3 times per day  . insulin aspart  0-5 Units Subcutaneous QHS  . insulin aspart  0-9 Units Subcutaneous TID WC  . insulin glargine  45 Units Subcutaneous QHS  . lisinopril  10 mg Oral QPM  . metoCLOPramide  5 mg Oral TID AC  . multivitamin  1 tablet Oral Daily  . pantoprazole  40 mg Oral Daily  . sevelamer carbonate  800-2,400 mg Oral See admin instructions   EXAM: General appearance: Alert, in no apparent distress Resp:  CTA without rales, rhonchi, or wheezes Cardio:  RRR without murmur or rub GI: + BS, soft and nontender Extremities:  No edema  Access:  AVF @ RFA with + bruit  HD: [facility] [place] 3h 75min 350/600 81.5kg 2/2.0 [place] Heparin 2000 RFA AVF Hect 3 ug TIW,  Aranesp 25 ug / wk  Assessment/Plan: 1. Syncopal episode - likely sec to hypotension on HD yesterday,  Followed by symptoms similar to TIA in 2009, soon resolved; BG stable, head CT & carotid Doppler unremarkable; Cardura (associated w/ postural hypotension) & Amlodipine (associated w/ worsened gastroparesis) qhs were dc'd, now Lisinopril with Carvedilol. Probably due to combination of factors, higher than usual wt gain, autonomic dysfunction w orthostatic hypotension d/t DM2 longstanding, and effect of pm BP meds.  Difficult problem to manage. Currently under dry wt.  Have stopped doxazosin d/t association w postural hypotension.  Today standing SBP is 130 (sitting 160). We should titrate BP meds at the OP HD unit according to STANDING blood pressures not sitting BP's, as there is a 30 mm Hg difference for this patient which could have significant effects.  This will be related to OP HD unit.  2. ESRD - HD on MWF @ [place], K3.5.  HD tomorrow. 3. HTN/Volume - BP 147/67, now on Carvedilol & Lisinopril; wt 79.6 kg, below EDW. 4. Anemia - Hgb 11.9, Aranesp 25 mcg qwk. 5. Sec HPT - Ca 8.8 (9 corrected); Calcitriol 0.25 mcg, Renvela with meals. 6. Nutrition - Alb 3.7, renal carb-mod diet, vitamin. 7. DM with retinopathy 8. Hx gastroparesis, poorly controlled currently - changed CCB to ACEi to avoid negative effects of CCB on gastroparesis   LOS: 1 day   LYLES,CHARLES 12/11/2014,9:02 AM  Pt seen, examined, agree w assess/plan as above with additions as indicated.  Kelly Splinter MD pager [phone]    cell [phone] 12/11/2014, 4:44 PM

## 2014-12-11 NOTE — Progress Notes (Signed)
  Echocardiogram 2D Echocardiogram has been performed.  [name] M 12/11/2014, 9:27 AM

## 2014-12-12 ENCOUNTER — Ambulatory Visit (HOSPITAL_COMMUNITY): Payer: Medicare Other

## 2014-12-12 DIAGNOSIS — I10 Essential (primary) hypertension: Secondary | ICD-10-CM

## 2014-12-12 LAB — GLUCOSE, CAPILLARY
GLUCOSE-CAPILLARY: 84 mg/dL (ref 70–99)
Glucose-Capillary: 86 mg/dL (ref 70–99)
Glucose-Capillary: 94 mg/dL (ref 70–99)
Glucose-Capillary: 151 mg/dL — ABNORMAL HIGH (ref 70–99)

## 2014-12-12 LAB — RENAL FUNCTION PANEL
ALBUMIN: 3.2 g/dL — AB (ref 3.5–5.2)
ANION GAP: 11 (ref 5–15)
BUN: 58 mg/dL — ABNORMAL HIGH (ref 6–23)
CO2: 25 mmol/L (ref 19–32)
Calcium: 9 mg/dL (ref 8.4–10.5)
Chloride: 103 mmol/L (ref 96–112)
Creatinine, Ser: 6.28 mg/dL — ABNORMAL HIGH (ref 0.50–1.10)
GFR calc Af Amer: 7 mL/min — ABNORMAL LOW (ref >90)
GFR calc non Af Amer: 6 mL/min — ABNORMAL LOW (ref >90)
GLUCOSE: 86 mg/dL (ref 70–99)
Phosphorus: 5.5 mg/dL — ABNORMAL HIGH (ref 2.3–4.6)
Potassium: 4.3 mmol/L (ref 3.5–5.1)
SODIUM: 139 mmol/L (ref 135–145)

## 2014-12-12 LAB — CBC
HCT: 29.8 % — ABNORMAL LOW (ref 36.0–46.0)
Hemoglobin: 9.5 g/dL — ABNORMAL LOW (ref 12.0–15.0)
MCH: 28.4 pg (ref 26.0–34.0)
MCHC: 31.9 g/dL (ref 30.0–36.0)
MCV: 89 fL (ref 78.0–100.0)
Platelets: 282 10*3/uL (ref 150–400)
RBC: 3.35 MIL/uL — ABNORMAL LOW (ref 3.87–5.11)
RDW: 13.3 % (ref 11.5–15.5)
WBC: 8.6 10*3/uL (ref 4.0–10.5)

## 2014-12-12 LAB — HEMOGLOBIN A1C
Hgb A1c MFr Bld: 7.5 % — ABNORMAL HIGH (ref 4.8–5.6)
MEAN PLASMA GLUCOSE: 169 mg/dL

## 2014-12-12 MED ORDER — ONDANSETRON HCL 4 MG/2ML IJ SOLN
4.0000 mg | Freq: Once | INTRAMUSCULAR | Status: AC
  Administered 2014-12-12: 4 mg via INTRAVENOUS

## 2014-12-12 MED ORDER — ONDANSETRON HCL 4 MG/2ML IJ SOLN
INTRAMUSCULAR | Status: AC
  Filled 2014-12-12: qty 2

## 2014-12-12 MED ORDER — DOXERCALCIFEROL 4 MCG/2ML IV SOLN
3.0000 ug | INTRAVENOUS | Status: DC
  Filled 2014-12-12: qty 2

## 2014-12-12 MED ORDER — DARBEPOETIN ALFA 40 MCG/0.4ML IJ SOSY: 40.0000 ug | PREFILLED_SYRINGE | INTRAMUSCULAR | Status: DC

## 2014-12-12 NOTE — Progress Notes (Addendum)
Wrong patient chart.

## 2014-12-12 NOTE — Progress Notes (Signed)
[facility] PROGRESS NOTE  [name] [id number] DOB: 10-Sep-1956 DOA: 12/10/2014 PCP: Kevan Ny, MD  Assessment/Plan: 1. Syncopal episode- with resultant left facial droop and slurred speech, now resolved neurology following. EEG is pending. 2. Constipation- patient was taking MiraLAX at home. Will start MiraLAX 17 g by mouth daily along with Dulcolax suppository when necessary 3. End-stage renal disease- patient is on hemodialysis, nephrology following 4. Diabetes mellitus- continue Lantus 45 units at bedtime and sliding scale insulin. 5. Hypertension- patient has history of hypertension, positive orthostatic hypotension with 30 points drop in the systolic blood pressure on standing. Her medications have been adjusted as per nephrology.  Code Status: Full code Family Communication: *No family at bedside Disposition Plan: Home when stable   Consultants:  Nephrology  Procedures:  None*  Antibiotics:  None  HPI/Subjective: *59 year old female with a history of end-stage renal disease on dialysis Monday Wednesday Friday, diabetes mellitus on insulin, hypertension, hyperlipidemia, anemia of chronic disease, depression, history of TIA who came to the ED after patient had syncopal event while at dialysis. Patient was hypotensive and was given food bolus after which blood pressure improved and patient was awake and alert but had facial droop. Patient also had dysarthria.  Patient symptoms have resolved.   Objective: Filed Vitals:   12/12/14 1311  BP: 118/61  Pulse: 86  Temp: 98.1 F (36.7 C)  Resp: 16    Intake/Output Summary (Last 24 hours) at 12/12/14 1517 Last data filed at 12/12/14 1150  Gross per 24 hour  Intake    180 ml  Output   2000 ml  Net  -1820 ml   Filed Weights   12/10/14 1743 12/12/14 0756 12/12/14 1150  Weight: 79.561 kg (175 lb 6.4 oz) 83.9 kg (184 lb 15.5 oz) 81.3 kg (179 lb 3.7 oz)    Exam:   General:  Appearing no acute  distress  Cardiovascular: S1-S2 regular  Respiratory: Clear bilaterally  Abdomen: Soft nontender no organomegaly  Musculoskeletal: *No edema  Data Reviewed: Basic Metabolic Panel:  Recent Labs Lab 12/10/14 0857 12/10/14 0909 12/12/14 0430  NA 139 140 139  K 3.4* 3.5 4.3  CL 100 100 103  CO2 26  --  25  GLUCOSE 161* 162* 86  BUN 24* 27* 58*  CREATININE 3.47* 3.40* 6.28*  CALCIUM 8.8  --  9.0  PHOS  --   --  5.5*   Liver Function Tests:  Recent Labs Lab 12/10/14 0857 12/12/14 0430  AST 20  --   ALT 25  --   ALKPHOS 282*  --   BILITOT 0.7  --   PROT 7.8  --   ALBUMIN 3.7 3.2*   No results for input(s): LIPASE, AMYLASE in the last 168 hours. No results for input(s): AMMONIA in the last 168 hours. CBC:  Recent Labs Lab 12/10/14 0857 12/10/14 0909 12/12/14 0430  WBC 8.5  --  8.6  NEUTROABS 5.8  --   --   HGB 10.5* 11.9* 9.5*  HCT 32.7* 35.0* 29.8*  MCV 88.6  --  89.0  PLT 271  --  282   Cardiac Enzymes: No results for input(s): CKTOTAL, CKMB, CKMBINDEX, TROPONINI in the last 168 hours. BNP (last 3 results) No results for input(s): BNP in the last 8760 hours.  ProBNP (last 3 results) No results for input(s): PROBNP in the last 8760 hours.  CBG:  Recent Labs Lab 12/11/14 1645 12/11/14 2138 12/12/14 0636 12/12/14 0842 12/12/14 1228  GLUCAP 104* 229* 86 94  84    No results found for this or any previous visit (from the past 240 hour(s)).   Studies: No results found.  Scheduled Meds: . aspirin  300 mg Rectal Daily   Or  . aspirin  325 mg Oral Daily  . carvedilol  25 mg Oral BID WC  . cholecalciferol  2,000 Units Oral Daily  . [START ON 12/17/2014] darbepoetin (ARANESP) injection - DIALYSIS  40 mcg Intravenous Q Wed-HD  . docusate sodium  200 mg Oral QHS  . doxercalciferol  3 mcg Intravenous Q M,W,F-HD  . heparin  5,000 Units Subcutaneous 3 times per day  . insulin aspart  0-5 Units Subcutaneous QHS  . insulin aspart  0-9 Units  Subcutaneous TID WC  . insulin glargine  45 Units Subcutaneous QHS  . lisinopril  10 mg Oral QPM  . metoCLOPramide  5 mg Oral TID AC  . multivitamin  1 tablet Oral Daily  . ondansetron      . pantoprazole  40 mg Oral Daily  . polyethylene glycol  17 g Oral Daily  . sevelamer carbonate  800-2,400 mg Oral See admin instructions   Continuous Infusions:   Principal Problem:   CVA (cerebral vascular accident) Active Problems:   Diabetes mellitus   Anemia of chronic renal failure   HTN (hypertension)   ESRD (end stage renal disease)   OSA (obstructive sleep apnea)   Syncope   Stroke-like symptoms    Time spent: 25 min    [facility] Pager [phone]. If 7PM-7AM, please contact night-coverage at [email or web address], password [facility] 12/12/2014, 3:17 PM  LOS: 2 days

## 2014-12-12 NOTE — Progress Notes (Signed)
PT Cancellation Note  Patient Details Name: [name] MRN: [id number] DOB: 1956/01/11   Cancelled Treatment:    Reason Eval/Treat Not Completed: Patient at procedure or test/unavailable pt off floor at dialysis. Will follow up next available time.   Candy Sledge A 12/12/2014, 11:04 AM  Candy Sledge, PT, DPT [phone]

## 2014-12-12 NOTE — Progress Notes (Signed)
Patient returned from hemodialysis. VSS, see vitals flowsheet, patient states she has slight headache but refused medication. Patient is now sitting up in bed eating. Call bell within patient's room. Will continue to monitor closely.

## 2014-12-12 NOTE — Progress Notes (Signed)
CARE MANAGEMENT NOTE 12/12/2014  Patient:  [name], [name]   Account Number:  [id number]  Date Initiated:  12/12/2014  Documentation initiated by:  Olga Coaster  Subjective/Objective Assessment:   ADMITTED WITH SYNCOPY RULE OUT CVA     Action/Plan:   CM FOLLOWING FOR DCP   Anticipated DC Date:  12/13/2014   Anticipated DC Plan: [place]  CM consult         Status of service:  In process, will continue to follow  Per UR Regulation:  Reviewed for med. necessity/level of care/duration of stay  Comments:  2/26/2016Mindi Slicker RN,BSN,MHA [id number]

## 2014-12-12 NOTE — Progress Notes (Signed)
Occupational Therapy Treatment/Discharge Patient Details Name: [name] MRN: [id number] DOB: Nov 24, 1955 Today's Date: 12/12/2014    History of present illness Patient is a 59 y/o female who was sent from HD secondary to syncope and possible stroke. PMH of ESRD on dialysis (M, W, F), diabetes mellitus on insulin, HTN, HLD, anemia of chronic disease, depression, history of TIAs and? History of MI.  Pt hypotensive and per ER she had left facial droop and left arm and leg weakness. On arrival to the ED she had dysarthria as well. MRI head (-). CT head (-).   OT comments  Completed all education regarding ADLs and functional mobility with pt this session. Recommend pt with 24/7 supervision initially upon discharge. Pt able to d/c when medically stable.  Pt needs tub bench.     Follow Up Recommendations  No OT follow up;Supervision/Assistance - 24 hour    Equipment Recommendations  Tub/shower bench    Recommendations for Other Services      Precautions / Restrictions Precautions Precautions: Fall Precaution Comments: orthostatic hypotension       Mobility Bed Mobility Overal bed mobility: Modified Independent                Transfers Overall transfer level: Needs assistance   Transfers: Sit to/from Stand Sit to Stand: Supervision         General transfer comment: Pt complained of feeling lightheaded initially upon standing    Balance Overall balance assessment: Needs assistance         Standing balance support: During functional activity Standing balance-Leahy Scale: Good                     ADL Overall ADL's : Needs assistance/impaired                                 Tub/ Shower Transfer: Set up;Supervision/safety Tub/Shower Transfer Details (indicate cue type and reason): Simulated tub transfer using tub bench Functional mobility during ADLs: Min guard General ADL Comments: Pt reports she was mod I with bathing and dressing this  am.                 Cognition   Behavior During Therapy: [facility] for tasks assessed/performed Overall Cognitive Status: Within Functional Limits for tasks assessed                                    Pertinent Vitals/ Pain       Pain Assessment: No/denies pain      Progress Toward Goals  OT Goals(current goals can now be found in the care plan section)  Progress towards OT goals: Goals met/education completed, patient discharged from OT  Acute Rehab OT Goals Patient Stated Goal: to go home  OT Goal Formulation: With patient Time For Goal Achievement: 12/25/14 Potential to Achieve Goals: Good ADL Goals Pt Will Perform Grooming: with modified independence;standing Pt Will Perform Upper Body Bathing: with modified independence;sitting Pt Will Perform Lower Body Bathing: with modified independence;sit to/from stand Pt Will Perform Upper Body Dressing: with modified independence;sitting Pt Will Perform Lower Body Dressing: with modified independence;sit to/from stand Pt Will Transfer to Toilet: with modified independence;ambulating;regular height toilet;grab bars Pt Will Perform Toileting - Clothing Manipulation and hygiene: with modified independence;sit to/from stand Pt Will Perform Tub/Shower Transfer: Tub transfer;with modified independence;ambulating;tub bench  Plan  Discharge plan remains appropriate;All goals met and education completed, patient discharged from OT services       End of Session Equipment Utilized During Treatment: Gait belt   Activity Tolerance Patient tolerated treatment well   Patient Left in chair;with chair alarm set;with family/visitor present           Time: 5612-5483 OT Time Calculation (min): 20 min  Charges: OT General Charges $OT Visit: 1 Procedure OT Treatments $Self Care/Home Management : 8-22 mins  Myley Bahner,HILLARY 12/12/2014, 4:39 PM  [facility], OTR/L  [phone] 12/12/2014

## 2014-12-12 NOTE — Progress Notes (Signed)
Subjective:   No further syncopal episodes- feeling better  Objective Filed Vitals:   12/12/14 0830 12/12/14 0900 12/12/14 0932 12/12/14 1000  BP: 125/84 147/75 136/64 104/47  Pulse: 79 83 80 78  Temp:      TempSrc:      Resp: 17 15 14 15   Height:      Weight:      SpO2:       Physical Exam General: alert and oriented no acute distress Heart: RRR Lungs: CTA, unlabored  Abdomen: soft, nontender +BS Extremities: no edema Dialysis Access:  R AVF patent on HD  HD: [facility]WF [place] 3h 57min 350/600 81.5kg 2/2.0 [place] Heparin 2000 RFA AVF Hect 3 ug TIW, Aranesp 25 ug / wk  Assessment/Plan: 1. Syncopal episode - likely sec to hypotension on HD yesterday, Followed by symptoms similar to TIA in 2009, soon resolved; BG stable, head CT & carotid Doppler unremarkable; Cardura (associated w/ postural hypotension) & Amlodipine (associated w/ worsened gastroparesis) qhs were dc'd, now Lisinopril with Carvedilol.  Yesterday standing SBP is 130 (sitting 160). We should titrate BP meds at the OP HD unit according to STANDING blood pressures not sitting BP's, as there is a 30 mm Hg difference for this patient which could have significant effects. This will be related to OP HD unit. 2. ESRD - HD on MWF @ [place], HD now 3. HTN/Volume - , now on Carvedilol & Lisinopril; should get close to edw today 4. Anemia - Hgb 9.5 from 11.9, Aranesp 25 mcg qwk-increase dose 5. Sec HPT - Ca 9; hectorol 3 , Renvela with meals. 6. Nutrition - Alb 3.2, renal carb-mod diet, vitamin. 7. DM with retinopathy 8. Hx gastroparesis, poorly controlled and undergoing OP eval - changed CCB to ACEi to avoid negative effects of CCB on gastroparesis   Shelle Iron, NP [place] [phone] 12/12/2014,10:25 AM  LOS: 2 days   Pt seen, examined and agree w A/P as above.  Kelly Splinter MD pager [phone]    cell [phone] 12/12/2014, 1:57 PM    Additional Objective Labs: Basic Metabolic  Panel:  Recent Labs Lab 12/10/14 0857 12/10/14 0909 12/12/14 0430  NA 139 140 139  K 3.4* 3.5 4.3  CL 100 100 103  CO2 26  --  25  GLUCOSE 161* 162* 86  BUN 24* 27* 58*  CREATININE 3.47* 3.40* 6.28*  CALCIUM 8.8  --  9.0  PHOS  --   --  5.5*   Liver Function Tests:  Recent Labs Lab 12/10/14 0857 12/12/14 0430  AST 20  --   ALT 25  --   ALKPHOS 282*  --   BILITOT 0.7  --   PROT 7.8  --   ALBUMIN 3.7 3.2*   No results for input(s): LIPASE, AMYLASE in the last 168 hours. CBC:  Recent Labs Lab 12/10/14 0857 12/10/14 0909 12/12/14 0430  WBC 8.5  --  8.6  NEUTROABS 5.8  --   --   HGB 10.5* 11.9* 9.5*  HCT 32.7* 35.0* 29.8*  MCV 88.6  --  89.0  PLT 271  --  282   Blood Culture    Component Value Date/Time   SDES URINE, CLEAN CATCH 11/24/2013 1539   SPECREQUEST NONE 11/24/2013 1539   CULT  11/24/2013 1539    ESCHERICHIA COLI Performed at [place] 11/26/2013 FINAL 11/24/2013 1539    Cardiac Enzymes: No results for input(s): CKTOTAL, CKMB, CKMBINDEX, TROPONINI in the last 168 hours. CBG:  Recent Labs Lab 12/11/14 1148 12/11/14 1645 12/11/14 2138 12/12/14 0636 12/12/14 0842  GLUCAP 131* 104* 229* 86 94   Iron Studies: No results for input(s): IRON, TIBC, TRANSFERRIN, FERRITIN in the last 72 hours. @lablastinr3 @ Studies/Results: Mr Brain Wo Contrast  12/10/2014   CLINICAL DATA:  Code stroke from dialysis with LEFT facial droop and LEFT-sided weakness earlier today. Low blood pressure.  EXAM: MRI HEAD WITHOUT CONTRAST  TECHNIQUE: Multiplanar, multiecho pulse sequences of the brain and surrounding structures were obtained without intravenous contrast.  COMPARISON:  CT head earlier today.  FINDINGS: No evidence for acute infarction, hemorrhage, mass lesion, hydrocephalus, or extra-axial fluid. Premature for age cerebral and cerebellar atrophy. Mild subcortical and periventricular T2 and FLAIR hyperintensities, likely chronic  microvascular ischemic change. Flow voids are maintained throughout the carotid, basilar, and vertebral arteries. There are no areas of chronic hemorrhage. Pituitary, pineal, and cerebellar tonsils unremarkable. No upper cervical lesions. Visualized calvarium, skull base, and upper cervical osseous structures unremarkable. Scalp and extracranial soft tissues, orbits, sinuses, and mastoids show no acute process.  IMPRESSION: Chronic changes as described. No evidence for acute cerebral infarction or other significant intracranial finding.   Electronically Signed   By: Rolla Flatten M.D.   On: 12/10/2014 11:35   Medications:   . aspirin  300 mg Rectal Daily   Or  . aspirin  325 mg Oral Daily  . calcitRIOL  0.25 mcg Oral Daily  . carvedilol  25 mg Oral BID WC  . cholecalciferol  2,000 Units Oral Daily  . docusate sodium  200 mg Oral QHS  . heparin  5,000 Units Subcutaneous 3 times per day  . insulin aspart  0-5 Units Subcutaneous QHS  . insulin aspart  0-9 Units Subcutaneous TID WC  . insulin glargine  45 Units Subcutaneous QHS  . lisinopril  10 mg Oral QPM  . metoCLOPramide  5 mg Oral TID AC  . multivitamin  1 tablet Oral Daily  . ondansetron      . pantoprazole  40 mg Oral Daily  . polyethylene glycol  17 g Oral Daily  . sevelamer carbonate  800-2,400 mg Oral See admin instructions

## 2014-12-12 NOTE — Progress Notes (Signed)
PT Cancellation Note and Discharge  Patient Details Name: [name] MRN: [id number] DOB: 1956-10-13   Cancelled Treatment:    Reason Treat Not Completed: Pt reports fatigue s/p dialysis. Denied concerns re: mobility and feels she will be ready for discharge home later today. Answered patient's questions related to use of a 4 wheel RW with seat (she had been told this might be helpful for her). Discussed indications and use and the fact that her syncopal episodes have been without warning and that this device likely would not prevent a fall in this case.   Pt again inquired re: getting a tub bench and noted that OT has recommended MD order one for home.   Pt denies any further PT needs and agrees with d/c from PT.   [name] 12/12/2014, 2:41 PM Pager [phone]

## 2014-12-12 NOTE — Progress Notes (Signed)
6:05am checked pt's v/s and bp was 177/64 pulse of 80; recheck 163/64 P=79; pt has dialysis this morning and orders state to hold antihypertension medications; paged [facility] to make dr aware of pt change in status.

## 2014-12-13 ENCOUNTER — Inpatient Hospital Stay (HOSPITAL_COMMUNITY): Payer: Medicare Other

## 2014-12-13 LAB — GLUCOSE, CAPILLARY
Glucose-Capillary: 93 mg/dL (ref 70–99)
Glucose-Capillary: 99 mg/dL (ref 70–99)
Glucose-Capillary: 124 mg/dL — ABNORMAL HIGH (ref 70–99)

## 2014-12-13 MED ORDER — INSULIN GLARGINE 100 UNIT/ML ~~LOC~~ SOLN: 45.0000 [IU] | Freq: Every day | SUBCUTANEOUS | Status: DC

## 2014-12-13 MED ORDER — LISINOPRIL 10 MG PO TABS: 10.0000 mg | ORAL_TABLET | Freq: Every evening | ORAL | Status: DC

## 2014-12-13 MED ORDER — ASPIRIN EC 81 MG PO TBEC: 81.0000 mg | DELAYED_RELEASE_TABLET | Freq: Every day | ORAL | Status: AC

## 2014-12-13 NOTE — Procedures (Signed)
ELECTROENCEPHALOGRAM REPORT   Patient: [name]       Room #: [id number] EEG No. ID: [id number] Age: 59 y.o.        Sex: female Referring Physician: Darrick Meigs Report Date:  12/13/2014        Interpreting Physician: Alexis Goodell  History: [name] is an 59 y.o. female with an episode of syncope and resultant facial droop and slurred speech  Medications:  Scheduled: . aspirin  300 mg Rectal Daily   Or  . aspirin  325 mg Oral Daily  . carvedilol  25 mg Oral BID WC  . cholecalciferol  2,000 Units Oral Daily  . [START ON 12/17/2014] darbepoetin (ARANESP) injection - DIALYSIS  40 mcg Intravenous Q Wed-HD  . docusate sodium  200 mg Oral QHS  . doxercalciferol  3 mcg Intravenous Q M,W,F-HD  . heparin  5,000 Units Subcutaneous 3 times per day  . insulin aspart  0-5 Units Subcutaneous QHS  . insulin aspart  0-9 Units Subcutaneous TID WC  . insulin glargine  45 Units Subcutaneous QHS  . lisinopril  10 mg Oral QPM  . metoCLOPramide  5 mg Oral TID AC  . multivitamin  1 tablet Oral Daily  . pantoprazole  40 mg Oral Daily  . polyethylene glycol  17 g Oral Daily  . sevelamer carbonate  800-2,400 mg Oral See admin instructions    Conditions of Recording:  This is a 16 channel EEG carried out with the patient in the awake, drowsy and asleep states.  Description:  The waking background activity consists of a low voltage, symmetrical, fairly well organized, 9 Hz alpha activity, seen from the parieto-occipital and posterior temporal regions.  Low voltage fast activity, poorly organized, is seen anteriorly and is at times superimposed on more posterior regions.  A mixture of theta and alpha rhythms are seen from the central and temporal regions. The patient drowses with slowing to irregular, low voltage theta and beta activity.   The patient goes in to a light sleep with symmetrical sleep spindles, vertex central sharp transients and irregular slow activity.  Hyperventilation was not performed.   Intermittent photic stimulation was performed but failed to illicit any change in the tracing.     IMPRESSION: Normal electroencephalogram, awake, asleep and with activation procedures. There are no focal lateralizing or epileptiform features.   Alexis Goodell, MD [facility] [phone] 12/13/2014, 1:07 PM

## 2014-12-13 NOTE — Discharge Summary (Signed)
Physician Discharge Summary  [name] [id number] DOB: [birth year] DOA: 12/10/2014  PCP: Kevan Ny, MD  Admit date: 12/10/2014 Discharge date: 12/13/2014  Time spent: *50 minutes  Recommendations for Outpatient Follow-up:  1. *Follow up PCP in 2 weeks  Discharge Diagnoses:  Principal Problem:   CVA (cerebral vascular accident) Active Problems:   Diabetes mellitus   Anemia of chronic renal failure   HTN (hypertension)   ESRD (end stage renal disease)   OSA (obstructive sleep apnea)   Syncope   Stroke-like symptoms   Discharge Condition: Stable  Diet recommendation: *Low salt diet  Filed Weights   12/10/14 1743 12/12/14 0756 12/12/14 1150  Weight: 79.561 kg (175 lb 6.4 oz) 83.9 kg (184 lb 15.5 oz) 81.3 kg (179 lb 3.7 oz)    History of present illness:  59 year old female with history of end-stage renal disease on dialysis (M, W, F), diabetes mellitus on insulin, hypertension, hyperlipidemia, anemia of chronic disease, depression, history of TIAs, ? History of MI, depression, arthritis was sent from dialysis Center for syncopal event while at dialysis. She was reportedly hypotensive and given a fluid bolus after which her blood pressure improved and patient was awake and alert but had a facial droop. As per ER as she had left facial droop and left arm and leg weakness. On arrival to the ED she had dysarthria as well. Patient does not remember the incident. Reports that she only completed 2 hours of her dialysis today.   Hospital Course:  1. Syncopal episode- with resultant left facial droop and slurred speech, now resolved neurology following. EEG is negative for seizure. Likely due to cerebral hypoperfusion from hypotension. Now resolve 2. End-stage renal disease- patient is on hemodialysis, nephrology following 3. Diabetes mellitus-patient was started on Lantus 45 units at bedtime, she wants to continue with the same Lantus at home. Will discontinue insulin 7030 which she  was taking at home. 4. Hypertension- patient has history of hypertension, positive orthostatic hypotension with 30 points drop in the systolic blood pressure on standing. Her medications have been adjusted as per nephrology. Will give prescription for lisinopril 10 mg by mouth daily. Amlodipine and Cardura has been discontinued as per nephrology.  Procedures:  Carotid duplex  EEG  Echocardiogram- 55-60% EF  Consultations:  Neurology  Nephrology  Discharge Exam: Filed Vitals:   12/13/14 0959  BP: 155/60  Pulse: 88  Temp: 97.3 F (36.3 C)  Resp: 18    General: Appearing no acute distress Cardiovascular: S1-S2 regular in rate and rhythm Respiratory: Soft nontender no organomegaly  Discharge Instructions   Discharge Instructions    Diet - low sodium heart healthy    Complete by:  As directed      Increase activity slowly    Complete by:  As directed           Current Discharge Medication List    START taking these medications   Details  aspirin EC 81 MG tablet Take 1 tablet (81 mg total) by mouth daily. Qty: 30 tablet, Refills: 2    insulin glargine (LANTUS) 100 UNIT/ML injection Inject 0.45 mLs (45 Units total) into the skin at bedtime. Qty: 10 mL, Refills: 11    lisinopril (PRINIVIL,ZESTRIL) 10 MG tablet Take 1 tablet (10 mg total) by mouth every evening. Qty: 30 tablet, Refills: 2      CONTINUE these medications which have NOT CHANGED   Details  carvedilol (COREG) 25 MG tablet Take 25 mg by mouth 2 (two) times daily  with a meal.      [employer] D3 2000 UNITS CAPS Take 2,000 Units by mouth daily.  Refills: 2    metoCLOPramide (REGLAN) 5 MG tablet Take 1 tablet (5 mg total) by mouth 3 (three) times daily before meals. Qty: 90 tablet, Refills: 2    multivitamin (RENA-VIT) TABS tablet Take 1 tablet by mouth daily.    pantoprazole (PROTONIX) 40 MG tablet Take 1 tablet (40 mg total) by mouth daily. Qty: 30 tablet, Refills: 11    RENVELA 800 MG tablet Take  800-2,400 mg by mouth See admin instructions. Take 3 tablets with meals and take 1 tablet with snacks    epoetin alfa (PROCRIT) 02725 UNIT/ML injection Inject 20,000 Units into the skin every 14 (fourteen) days. In dialysis      STOP taking these medications     amLODipine (NORVASC) 10 MG tablet      doxazosin (CARDURA) 2 MG tablet      insulin aspart protamine-insulin aspart (NOVOLOG 70/30) (70-30) 100 UNIT/ML injection        Allergies  Allergen Reactions  . Codeine Nausea And Vomiting  . Hydrocodone Other (See Comments)    hallucinations  . Hydrocodone-Acetaminophen Nausea And Vomiting    Causes hallucination as well      The results of significant diagnostics from this hospitalization (including imaging, microbiology, ancillary and laboratory) are listed below for reference.    Significant Diagnostic Studies: Ct Head (brain) Wo Contrast  12/10/2014   CLINICAL DATA:  Possible ongoing stroke  EXAM: CT HEAD WITHOUT CONTRAST  TECHNIQUE: Contiguous axial images were obtained from the base of the skull through the vertex without intravenous contrast.  COMPARISON:  11/12/2013  FINDINGS: The bony calvarium is intact. No gross soft tissue abnormality is noted. No acute hemorrhage, acute infarction or space-occupying mass lesion is identified. Mild atrophic changes are noted.  IMPRESSION: Mild atrophic changes.  No acute abnormality is noted.   Electronically Signed   By: Inez Catalina M.D.   On: 12/10/2014 09:15   Mr Brain Wo Contrast  12/10/2014   CLINICAL DATA:  Code stroke from dialysis with LEFT facial droop and LEFT-sided weakness earlier today. Low blood pressure.  EXAM: MRI HEAD WITHOUT CONTRAST  TECHNIQUE: Multiplanar, multiecho pulse sequences of the brain and surrounding structures were obtained without intravenous contrast.  COMPARISON:  CT head earlier today.  FINDINGS: No evidence for acute infarction, hemorrhage, mass lesion, hydrocephalus, or extra-axial fluid. Premature for  age cerebral and cerebellar atrophy. Mild subcortical and periventricular T2 and FLAIR hyperintensities, likely chronic microvascular ischemic change. Flow voids are maintained throughout the carotid, basilar, and vertebral arteries. There are no areas of chronic hemorrhage. Pituitary, pineal, and cerebellar tonsils unremarkable. No upper cervical lesions. Visualized calvarium, skull base, and upper cervical osseous structures unremarkable. Scalp and extracranial soft tissues, orbits, sinuses, and mastoids show no acute process.  IMPRESSION: Chronic changes as described. No evidence for acute cerebral infarction or other significant intracranial finding.   Electronically Signed   By: Rolla Flatten M.D.   On: 12/10/2014 11:35    Microbiology: No results found for this or any previous visit (from the past 240 hour(s)).   Labs: Basic Metabolic Panel:  Recent Labs Lab 12/10/14 0857 12/10/14 0909 12/12/14 0430  NA 139 140 139  K 3.4* 3.5 4.3  CL 100 100 103  CO2 26  --  25  GLUCOSE 161* 162* 86  BUN 24* 27* 58*  CREATININE 3.47* 3.40* 6.28*  CALCIUM 8.8  --  9.0  PHOS  --   --  5.5*   Liver Function Tests:  Recent Labs Lab 12/10/14 0857 12/12/14 0430  AST 20  --   ALT 25  --   ALKPHOS 282*  --   BILITOT 0.7  --   PROT 7.8  --   ALBUMIN 3.7 3.2*   No results for input(s): LIPASE, AMYLASE in the last 168 hours. No results for input(s): AMMONIA in the last 168 hours. CBC:  Recent Labs Lab 12/10/14 0857 12/10/14 0909 12/12/14 0430  WBC 8.5  --  8.6  NEUTROABS 5.8  --   --   HGB 10.5* 11.9* 9.5*  HCT 32.7* 35.0* 29.8*  MCV 88.6  --  89.0  PLT 271  --  282   Cardiac Enzymes: No results for input(s): CKTOTAL, CKMB, CKMBINDEX, TROPONINI in the last 168 hours. BNP: BNP (last 3 results) No results for input(s): BNP in the last 8760 hours.  ProBNP (last 3 results) No results for input(s): PROBNP in the last 8760 hours.  CBG:  Recent Labs Lab 12/12/14 1228  12/12/14 1621 12/12/14 2205 12/13/14 0630 12/13/14 1211  GLUCAP 84 151* 93 99 124*       Signed:  Michala Deblanc S  [facility] 12/13/2014, 1:25 PM

## 2014-12-13 NOTE — Care Management Note (Signed)
    Page 1 of 1   12/13/2014     2:00:57 PM CARE MANAGEMENT NOTE 12/13/2014  Patient:  IFUNANYA, FIERO   Account Number:  [id number]  Date Initiated:  12/12/2014  Documentation initiated by:  Olga Coaster  Subjective/Objective Assessment:   ADMITTED WITH SYNCOPY RULE OUT CVA     Action/Plan:   CM FOLLOWING FOR DCP   Anticipated DC Date:  12/13/2014   Anticipated DC Plan:  [place]  CM consult      Choice offered to / List presented to:             Status of service:  Completed, signed off Medicare Important Message given?   (If response is "NO", the following Medicare IM given date fields will be blank) Date Medicare IM given:   Medicare IM given by:   Date Additional Medicare IM given:   Additional Medicare IM given by:    Discharge Disposition:  HOME/SELF CARE  Per UR Regulation:  Reviewed for med. necessity/level of care/duration of stay  If discussed at [place] of Stay Meetings, dates discussed:    Comments:  12/13/14 13:55 CM spoke with pt concerning tub bench; insurance will not cover charge and pt states out of pocket is too expensive. CM suggested [employer] or [employer] on [address].  Pt expressed appreciation.  No other CM needs were communicated. Mariane Masters,  BSN, [place].  2/26/2016Mindi Slicker RN,BSN,MHA [phone]

## 2014-12-13 NOTE — Progress Notes (Signed)
Patient [place] home via car with family.  DC instructions and prescription given to patient and fully understood.  Vital signs and assessments were stable.

## 2014-12-13 NOTE — Progress Notes (Signed)
Routine EEG completed at bedside. Results pending.

## 2014-12-16 ENCOUNTER — Ambulatory Visit (HOSPITAL_COMMUNITY)
Admission: RE | Admit: 2014-12-16 | Discharge: 2014-12-16 | Disposition: A | Discharge status: Home / Self Care | Payer: Medicare Other | Source: Ambulatory Visit | Attending: Cardiology | Admitting: Cardiology

## 2014-12-16 DIAGNOSIS — R110 Nausea: Secondary | ICD-10-CM | POA: Insufficient documentation

## 2014-12-16 DIAGNOSIS — R52 Pain, unspecified: Secondary | ICD-10-CM | POA: Diagnosis not present

## 2014-12-16 DIAGNOSIS — K3184 Gastroparesis: Secondary | ICD-10-CM

## 2014-12-16 LAB — GLUCOSE, CAPILLARY: Glucose-Capillary: 82 mg/dL (ref 70–99)

## 2014-12-16 MED ORDER — TECHNETIUM TC 99M SULFUR COLLOID
2.0000 | Freq: Once | INTRAVENOUS | Status: AC | PRN
  Administered 2014-12-16: 2 via ORAL

## 2014-12-17 ENCOUNTER — Telehealth: Payer: Self-pay | Admitting: *Deleted

## 2014-12-17 NOTE — Telephone Encounter (Signed)
Called to let pt. Know results were normal, and to follow-up with Dr. Radford Pax PRN.  Pt. Stated Verbal understanding

## 2014-12-17 NOTE — Telephone Encounter (Signed)
-----   Message from Sueanne Margarita, MD sent at 12/16/2014  1:43 PM EST ----- Please let patient know that gastric emptying study was normal

## 2014-12-19 ENCOUNTER — Ambulatory Visit: Payer: Medicaid Other | Admitting: Pulmonary Disease

## 2015-01-21 NOTE — Progress Notes (Signed)
Cardiology Office Note   Date:  01/22/2015   ID:  [name], [name] [birth year], MRN [id number]  PCP:  Kevan Ny, MD    Chief Complaint  Patient presents with  . Chest Pain  . Hypertension      History of Present Illness: [name] is a 59 y.o. female who presents for evaluation of chest pain. The patient has a history of DM, HTN, OSA and ESRD on HD with ? history of CAD in medical records including cardiac cath in 2003 and stress test 2011 as per our records is negative, as well patient reports she had negative dobutamine stress test done in 07/31/2014 at [address] [facility] as a part of her workup to be on a transplant list, patient reports stress test was normal. She apparently had CP during HD 09/01/2014 that was epigastric and left sided and did not radiate. There was no associated SOB/N/V or diaphoresis. She complained of belching and early satiety. She ruled out by serial tropoinins and EKG showed no ischemia. It was felt she had gastroparesis and no further cardiac workup indicated. She had recurrent CP that was sharp and left sided that lasted <10 seconds on 11/14/2014. She has had some chest tightness intermittently and took some TUMS which relieved the tightness. She underwent gastric emptying study which was normal.  She now presents for followup.  She was recently hospitalized for a syncopal episode with left facial droop and slurred speech that was felt to be due to cerebral hypoperfusion from hypotension. This occurred while on HD when her BP dropped to [id number] systolic.  Her amlodipine an Cardura were stopped and she was started on low dose ACE I.  When she went back to HD she was restarted on amlodipine and cardura.  She is tolerating the meds well.  She has not had any further dizziness or syncope.  She continues to have sharp stabbing fleeting pains that last for a minute.  She says that they have gotten her dry weight adjusted and she no longer has dizziness  with HD.  She denise any LE edema, palpitations or syncope.   Past Medical History  Diagnosis Date  . Hypertension   . Hyperlipidemia   . Diabetes mellitus     diagnosed in 1988, on insulin.  . Anemia     2/2 renal failure  . Diabetic retinopathy     s/p PRP OS  . History of UTI   . Depression   . History of TIAs     10/08- twisted face and slurred speech without extremity weakness  . History of MI (myocardial infarction)   . Bleeding     History of retinal bleed on full dose aspirin  . S/P left heart catheterization by cutdown     July 2003, normal coronary arteries.  . Arthritis     ? Hands  . CHF (congestive heart failure)   . Myocardial infarction     per patient about 5 yrs ago  . Hyperparathyroidism   . Thyroid disease   . Pneumonia     hx  . Obstructive sleep apnea     to have new sleep study 04/25/13 does not use cpap  . Heart murmur   . ESRD on hemodialysis     Started HD in 2015    Past Surgical History  Procedure Laterality Date  . Hernia repair    . Cholecystectomy    . Abdominal hysterectomy    . Umbical hernia    .  Eye surgery      Laser DM   . Av fistula placement  12/05/2011    Procedure: ARTERIOVENOUS (AV) FISTULA CREATION;  Surgeon: Elam Dutch, MD;  Location: [facility] OR;  Service: Vascular;  Laterality: Right;  Creation of Arteriovenous fistula right lower arm   . Revison of arteriovenous fistula Right 04/17/2013    Procedure: REVISON OF ARTERIOVENOUS FISTULA and superficialization;  Surgeon: Elam Dutch, MD;  Location: [facility] OR;  Service: Vascular;  Laterality: Right;     Current Outpatient Prescriptions  Medication Sig Dispense Refill  . amLODipine (NORVASC) 10 MG tablet Take 10 mg by mouth daily.  4  . aspirin EC 81 MG tablet Take 1 tablet (81 mg total) by mouth daily. 30 tablet 2  . B-D ULTRAFINE III SHORT PEN 31G X 8 MM MISC 3 (three) times daily.  2  . carvedilol (COREG) 25 MG tablet Take 25 mg by mouth 2 (two) times daily with a meal.       . [employer] D3 2000 UNITS CAPS Take 2,000 Units by mouth daily.   2  . doxazosin (CARDURA) 2 MG tablet Take 2 mg by mouth at bedtime.  6  . epoetin alfa (PROCRIT) 28413 UNIT/ML injection Inject 20,000 Units into the skin every 14 (fourteen) days. In dialysis    . insulin glargine (LANTUS) 100 UNIT/ML injection Inject 0.45 mLs (45 Units total) into the skin at bedtime. 10 mL 11  . lisinopril (PRINIVIL,ZESTRIL) 10 MG tablet Take 1 tablet (10 mg total) by mouth every evening. 30 tablet 2  . metoCLOPramide (REGLAN) 5 MG tablet Take 1 tablet (5 mg total) by mouth 3 (three) times daily before meals. 90 tablet 2  . multivitamin (RENA-VIT) TABS tablet Take 1 tablet by mouth daily.    Marland Kitchen NOVOLOG MIX 70/30 FLEXPEN (70-30) 100 UNIT/ML FlexPen Inject 65 Units as directed daily.  3  . pantoprazole (PROTONIX) 40 MG tablet Take 1 tablet (40 mg total) by mouth daily. 30 tablet 11  . RENVELA 800 MG tablet Take 800-2,400 mg by mouth See admin instructions. Take 3 tablets with meals and take 1 tablet with snacks     No current facility-administered medications for this visit.    Allergies:   Codeine; Hydrocodone; and Hydrocodone-acetaminophen    Social History:  The patient  reports that she quit smoking about 32 years ago. Her smoking use included Cigarettes. She has a 10 pack-year smoking history. She has never used smokeless tobacco. She reports that she does not drink alcohol or use illicit drugs.   Family History:  The patient's family history includes Cancer in her mother and sister; Diabetes in her father, mother, and son; Heart attack in her father and mother; Heart disease in her father, mother, and son; Hyperlipidemia in her father and mother; Hypertension in her father, maternal grandmother, mother, paternal grandmother, and son. There is no history of Colon cancer or Anesthesia problems.    ROS:  Please see the history of present illness.   Otherwise, review of systems are positive for none.   All other  systems are reviewed and negative.    PHYSICAL EXAM: VS:  BP 120/58 mmHg  Pulse 89  Ht 5\' 4"  (1.626 m)  Wt 185 lb 6.4 oz (84.097 kg)  BMI 31.81 kg/m2  SpO2 99% , BMI Body mass index is 31.81 kg/(m^2). GEN: Well nourished, well developed, in no acute distress HEENT: normal Neck: no JVD, carotid bruits, or masses Cardiac: RRR; no murmurs, rubs, or  gallops,no edema  Respiratory:  clear to auscultation bilaterally, normal work of breathing GI: soft, nontender, nondistended, + BS MS: no deformity or atrophy Skin: warm and dry, no rash Neuro:  Strength and sensation are intact Psych: euthymic mood, full affect   EKG:  EKG is not ordered today.    Recent Labs: 12/10/2014: ALT 25 12/12/2014: BUN 58*; Creatinine 6.28*; Hemoglobin 9.5*; Platelets 282; Potassium 4.3; Sodium 139    Lipid Panel    Component Value Date/Time   CHOL 151 12/11/2014 0401   TRIG 109 12/11/2014 0401   HDL 39* 12/11/2014 0401   CHOLHDL 3.9 12/11/2014 0401   VLDL 22 12/11/2014 0401   LDLCALC 90 12/11/2014 0401      Wt Readings from Last 3 Encounters:  01/22/15 185 lb 6.4 oz (84.097 kg)  12/12/14 179 lb 3.7 oz (81.3 kg)  12/02/14 183 lb (83.008 kg)    ASSESSMENT AND PLAN:  1. Atypical chest pain that is sharp and fleeting. Dobutamine stress echo showed no ischemia in October 2015.Gastric emptying study showed no gastroparesis.   Her symptoms have improved with Reglan and TUMS.When I last saw her she was placed on Protonix 40mg  daily and her indigestion has resolved. Symptoms are atypical for ischemia.  2. HTN - contolled. Continue Coreg/ACE I/cardura and amlodipine 3. ESRD 4. DM per PCP   Current medicines are reviewed at length with the patient today.  The patient does not have concerns regarding medicines.  The following changes have been made:  no change  Labs/ tests ordered today include: see above assessment and plan No orders of the defined types were placed in this encounter.       Disposition:   FU with me in 6 months   Signed, Sueanne Margarita, MD  01/22/2015 11:13 AM    [facility] [address], [place], [place]  [address] Phone: [phone]; Fax: [phone]

## 2015-01-22 ENCOUNTER — Ambulatory Visit (INDEPENDENT_AMBULATORY_CARE_PROVIDER_SITE_OTHER): Payer: Medicare Other | Admitting: Cardiology

## 2015-01-22 ENCOUNTER — Encounter: Payer: Self-pay | Admitting: Cardiology

## 2015-01-22 VITALS — BP 120/58 | HR 89 | Ht 64.0 in | Wt 185.4 lb

## 2015-01-22 DIAGNOSIS — R55 Syncope and collapse: Secondary | ICD-10-CM

## 2015-01-22 DIAGNOSIS — R079 Chest pain, unspecified: Secondary | ICD-10-CM | POA: Diagnosis not present

## 2015-01-22 DIAGNOSIS — I10 Essential (primary) hypertension: Secondary | ICD-10-CM | POA: Diagnosis not present

## 2015-01-22 NOTE — Patient Instructions (Signed)
Your physician wants you to follow-up in: 6 months with Dr Radford Pax. (October 2016).  You will receive a reminder letter in the mail two months in advance. If you don't receive a letter, please call our office to schedule the follow-up appointment.

## 2015-02-03 ENCOUNTER — Ambulatory Visit: Payer: Medicaid Other | Admitting: Certified Nurse Midwife

## 2015-03-18 ENCOUNTER — Telehealth: Payer: Self-pay | Admitting: Cardiology

## 2015-03-18 NOTE — Telephone Encounter (Signed)
Patient needs to call her REnal MD immediately to let them know her symptoms and BP

## 2015-03-18 NOTE — Telephone Encounter (Signed)
Pt c/o BP issue: STAT if pt c/o blurred vision, one-sided weakness or slurred speech  1. What are your last 5 BP readings? 189/102, 156/94  2. Are you having any other symptoms (ex. Dizziness, headache, blurred vision, passed out)? Ongoing HA  3. What is your BP issue? Pt is concerned bc BP has been high x few weeks

## 2015-03-18 NOTE — Telephone Encounter (Signed)
Spoke with [name] and she states that this AM at dialysis her BP was 189/102. [name] check BP again after returning home and it was 156/94, no HR available. [name] states that she just took her BP meds after returning home from dialysis. [name] states that her BP has been staying high for the last few weeks- systolic has been staying [id number] and diastolic has stayed above 80. [name] complains of continuous headache and dizziness and lightheadedness when BP high.  Denies other symptoms. [name] states that she is taking her meds as directed. [name] has appt on 6/16 with Ignacia Bayley, NP. Will forward to Dr. Radford Pax for review and advisement.

## 2015-03-18 NOTE — Telephone Encounter (Signed)
Instructed patient to call her kidey MD immediately to notify of symptoms and BP. Patient agrees with treatment plan.

## 2015-04-02 ENCOUNTER — Ambulatory Visit (INDEPENDENT_AMBULATORY_CARE_PROVIDER_SITE_OTHER): Payer: Medicare Other | Admitting: Nurse Practitioner

## 2015-04-02 ENCOUNTER — Encounter: Payer: Self-pay | Admitting: Nurse Practitioner

## 2015-04-02 VITALS — BP 130/60 | HR 86 | Ht 64.0 in | Wt 180.0 lb

## 2015-04-02 DIAGNOSIS — E785 Hyperlipidemia, unspecified: Secondary | ICD-10-CM | POA: Insufficient documentation

## 2015-04-02 DIAGNOSIS — I10 Essential (primary) hypertension: Secondary | ICD-10-CM

## 2015-04-02 DIAGNOSIS — I5032 Chronic diastolic (congestive) heart failure: Secondary | ICD-10-CM

## 2015-04-02 NOTE — Patient Instructions (Signed)
Medication Instructions:   Your physician recommends that you continue on your current medications as directed. Please refer to the Current Medication list given to you today.   Labwork:   Testing/Procedures:   Follow-Up:  Your physician wants you to follow-up in:  IN [place] will receive a reminder letter in the mail two months in advance. If you don't receive a letter, please call our office to schedule the follow-up appointment.   Any Other Special Instructions Will Be Listed Below (If Applicable).

## 2015-04-02 NOTE — Progress Notes (Signed)
Patient Name: [name] Date of Encounter: 04/02/2015  Primary Care Provider:  Kevan Ny, MD Primary Cardiologist:  T. Radford Pax, MD  Chief Complaint  59 y/o female with a h/o chest pain, htn, and esrd, who presents for f/u related to recent HTN.  Past Medical History   Past Medical History  Diagnosis Date  . Hypertension   . Hyperlipidemia   . Diabetes mellitus     diagnosed in 1988, on insulin.  . Anemia     2/2 renal failure  . Diabetic retinopathy     s/p PRP OS  . History of UTI   . Depression   . History of TIAs     10/08- twisted face and slurred speech without extremity weakness  . Bleeding     History of retinal bleed on full dose aspirin  . Arthritis     ? Hands  . CHF (congestive heart failure)   . Myocardial infarction     a. 2003 reportedly nl cath; b. 2011 neg MV; c. 07/2014 Dob Echo: nonischemic.  Marland Kitchen Hyperparathyroidism   . Thyroid disease   . Pneumonia     hx  . Obstructive sleep apnea     to have new sleep study 04/25/13 does not use cpap  . Chronic diastolic CHF (congestive heart failure)     a. 11/2014 Echo: EF 55-60%, Gr 2 DD, Ao sclerosis, triv MR.  . ESRD on hemodialysis     Started HD in 2015   Past Surgical History  Procedure Laterality Date  . Hernia repair    . Cholecystectomy    . Abdominal hysterectomy    . Umbical hernia    . Eye surgery      Laser DM   . Av fistula placement  12/05/2011    Procedure: ARTERIOVENOUS (AV) FISTULA CREATION;  Surgeon: Elam Dutch, MD;  Location: [facility] OR;  Service: Vascular;  Laterality: Right;  Creation of Arteriovenous fistula right lower arm   . Revison of arteriovenous fistula Right 04/17/2013    Procedure: REVISON OF ARTERIOVENOUS FISTULA and superficialization;  Surgeon: Elam Dutch, MD;  Location: [facility];  Service: Vascular;  Laterality: Right;    Allergies  Allergies  Allergen Reactions  . Codeine Nausea And Vomiting  . Hydrocodone Other (See Comments)    hallucinations  .  Hydrocodone-Acetaminophen Nausea And Vomiting    Causes hallucination as well    HPI  59 y/o female with the above complex problem list.  She has a h/o chest pain with neg work-ups in the past (last - dobut echo @ [facility] - nl - report in care everywhere).  She also has a h/o ESRD and is on MWF dialysis.  She does not take her usual BP meds on dialysis days b/c she also has a h/o syncope in the setting of hypotension and cerebral hypoperfusion.  Last week, she noted elevated blood pressures prior to her dialysis sessions and called in for advice.  She notes that pre dialysis BP's are often in the 160's, whereas BP's on non-dialysis days, after meds, are much more likely to be in the 120's to 130's.  She is not symptomatic with higher bp's.  She denies any recent h/o chest pain, sob, pnd, orthopnea, n, v, dizziness, syncope, edema, or early satiety.  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  Home Medications  Prior to Admission medications   Medication Sig Start Date End  Date Taking? Authorizing Provider  amLODipine (NORVASC) 10 MG tablet Take 10 mg by mouth daily. 01/09/15  Yes Historical Provider, MD  aspirin EC 81 MG tablet Take 1 tablet (81 mg total) by mouth daily. 12/13/14  Yes Oswald Hillock, MD  B-D ULTRAFINE III SHORT PEN 31G X 8 MM MISC 3 (three) times daily. 12/02/14  Yes Historical Provider, MD  carvedilol (COREG) 25 MG tablet Take 25 mg by mouth 2 (two) times daily with a meal.     Yes Historical Provider, MD  CVS D3 2000 UNITS CAPS Take 2,000 Units by mouth daily.  08/12/14  Yes Historical Provider, MD  doxazosin (CARDURA) 2 MG tablet Take 2 mg by mouth at bedtime. 12/30/14  Yes Historical Provider, MD  epoetin alfa (PROCRIT) 02725 UNIT/ML injection Inject 20,000 Units into the skin every 14 (fourteen) days. In dialysis   Yes Historical Provider, MD  insulin glargine (LANTUS) 100 UNIT/ML injection Inject 0.45 mLs (45 Units  total) into the skin at bedtime. 12/13/14  Yes Oswald Hillock, MD  lisinopril (PRINIVIL,ZESTRIL) 10 MG tablet Take 1 tablet (10 mg total) by mouth every evening. 12/13/14  Yes Oswald Hillock, MD  lisinopril (PRINIVIL,ZESTRIL) 20 MG tablet Take 20 mg by mouth at bedtime. 03/24/15  Yes Historical Provider, MD  metoCLOPramide (REGLAN) 5 MG tablet Take 1 tablet (5 mg total) by mouth 3 (three) times daily before meals. 09/02/14  Yes Reyne Dumas, MD  multivitamin (RENA-VIT) TABS tablet Take 1 tablet by mouth daily.   Yes Historical Provider, MD  NOVOLOG MIX 70/30 FLEXPEN (70-30) 100 UNIT/ML FlexPen Inject 65 Units as directed daily. 11/27/14  Yes Historical Provider, MD  pantoprazole (PROTONIX) 40 MG tablet Take 1 tablet (40 mg total) by mouth daily. 12/02/14  Yes Sueanne Margarita, MD  RENVELA 800 MG tablet Take 800-2,400 mg by mouth See admin instructions. Take 3 tablets with meals and take 1 tablet with snacks 08/27/14  Yes Historical Provider, MD  SENSIPAR 30 MG tablet Take 30 mg by mouth daily. 03/25/15  Yes Historical Provider, MD    Review of Systems  She denies chest pain, palpitations, dyspnea, pnd, orthopnea, n, v, dizziness, syncope, edema, weight gain, or early satiety.  All other systems reviewed and are otherwise negative except as noted above.  Physical Exam  VS:  BP 130/60 mmHg  Pulse 86  Ht 5\' 4"  (1.626 m)  Wt 180 lb (81.647 kg)  BMI 30.88 kg/m2 , BMI Body mass index is 30.88 kg/(m^2). GEN: Well nourished, well developed, in no acute distress. HEENT: normal. Neck: Supple, no JVD, carotid bruits, or masses. Cardiac: RRR, rubs, or gallops. Soft systolic murmur @ base.  No clubbing, cyanosis, edema.  Radials/DP/PT 2+ and equal bilaterally.  Respiratory:  Respirations regular and unlabored, clear to auscultation bilaterally. GI: Soft, nontender, nondistended, BS + x 4. MS: no deformity or atrophy. Skin: warm and dry, no rash. Neuro:  Strength and sensation are intact. Psych: Normal  affect.  Accessory Clinical Findings  None  Assessment & Plan  1.  Essential HTN:  Her BP is well-controlled today.  BP tends to be higher on non-dialysis days as she does not take her meds those morning b/c she has a prior h/o hypotension with dialysis @ one time resulting in syncope.  Based on nl BP today, and prior hx, I am not going to change her regimen any today.  She will continue to track her BP's during the week and if she continues to see  markedly elevated pressures prior to dialysis (more than what she is used to - 160's), than she will touch base with Dr. Marval Regal.  2.  ESRD:  MWF dialysis.  3.  Dispo:  F/u Dr. Radford Pax in 6 mos or sooner if necessary.   Murray Hodgkins, NP 04/02/2015, 4:57 PM

## 2015-04-29 IMAGING — CR DG KNEE COMPLETE 4+V*R*
4 series · 4 of 4 positions shown · non-contrast
Comparison: None.

CLINICAL DATA: Pain post trauma

EXAM:
RIGHT KNEE - COMPLETE 4+ VIEW

[t knee ap right]
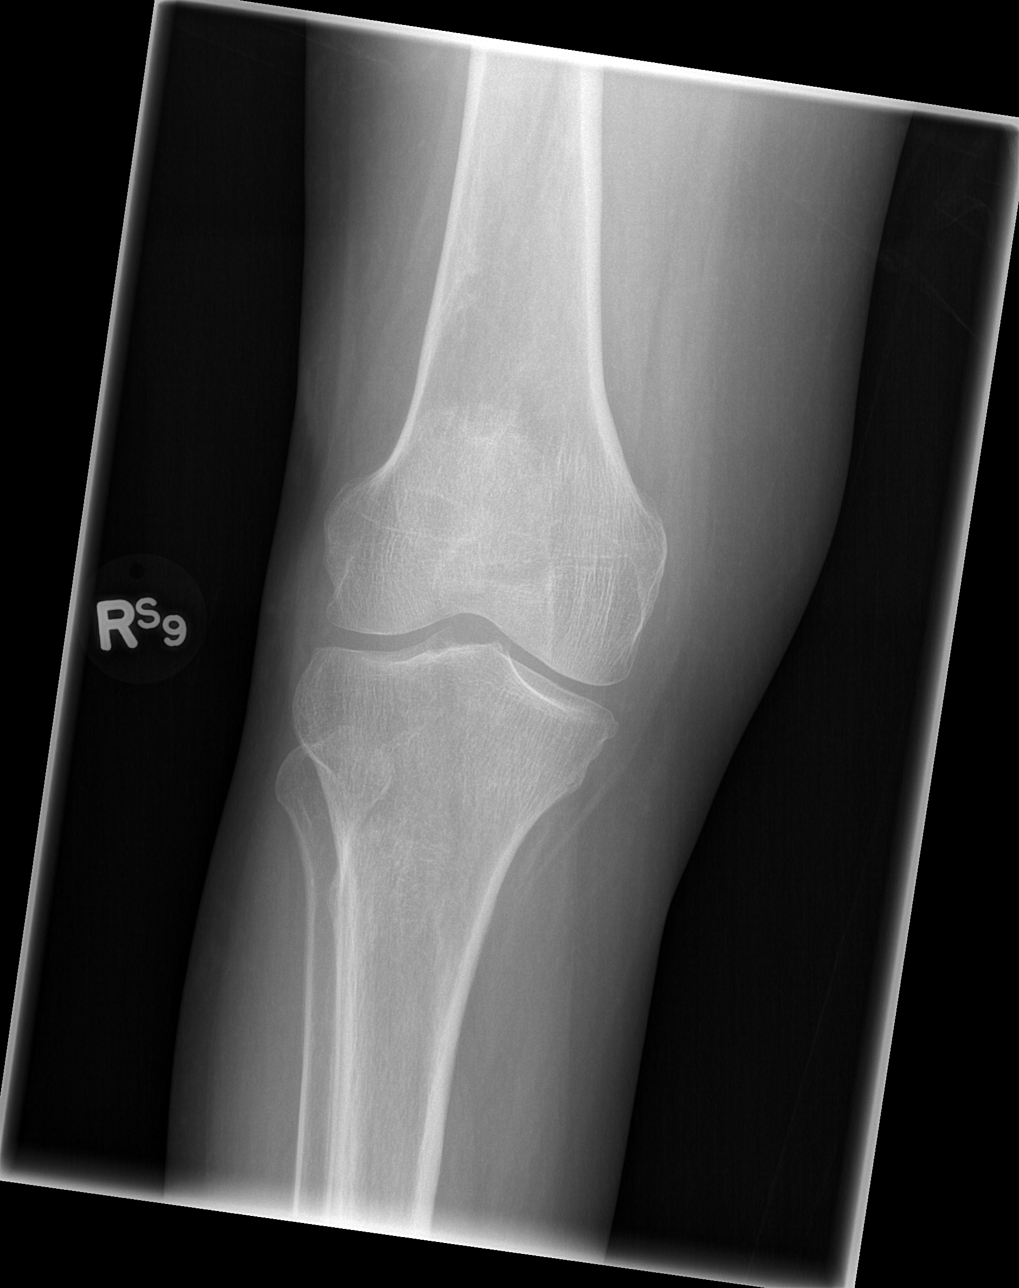

[t knee oblique right (1 of 2)]
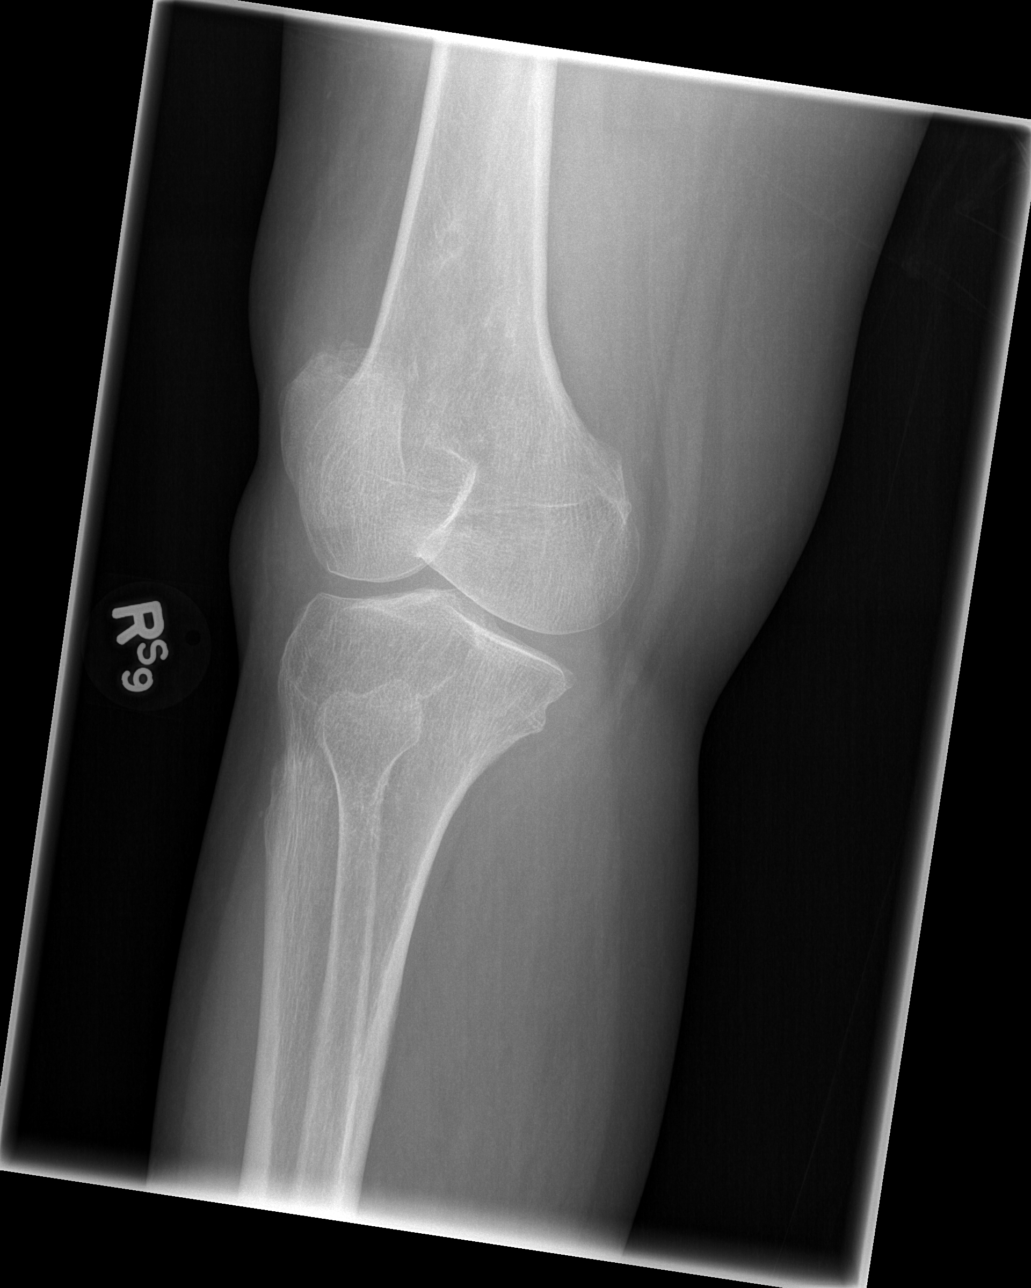

[t knee oblique right (2 of 2)]
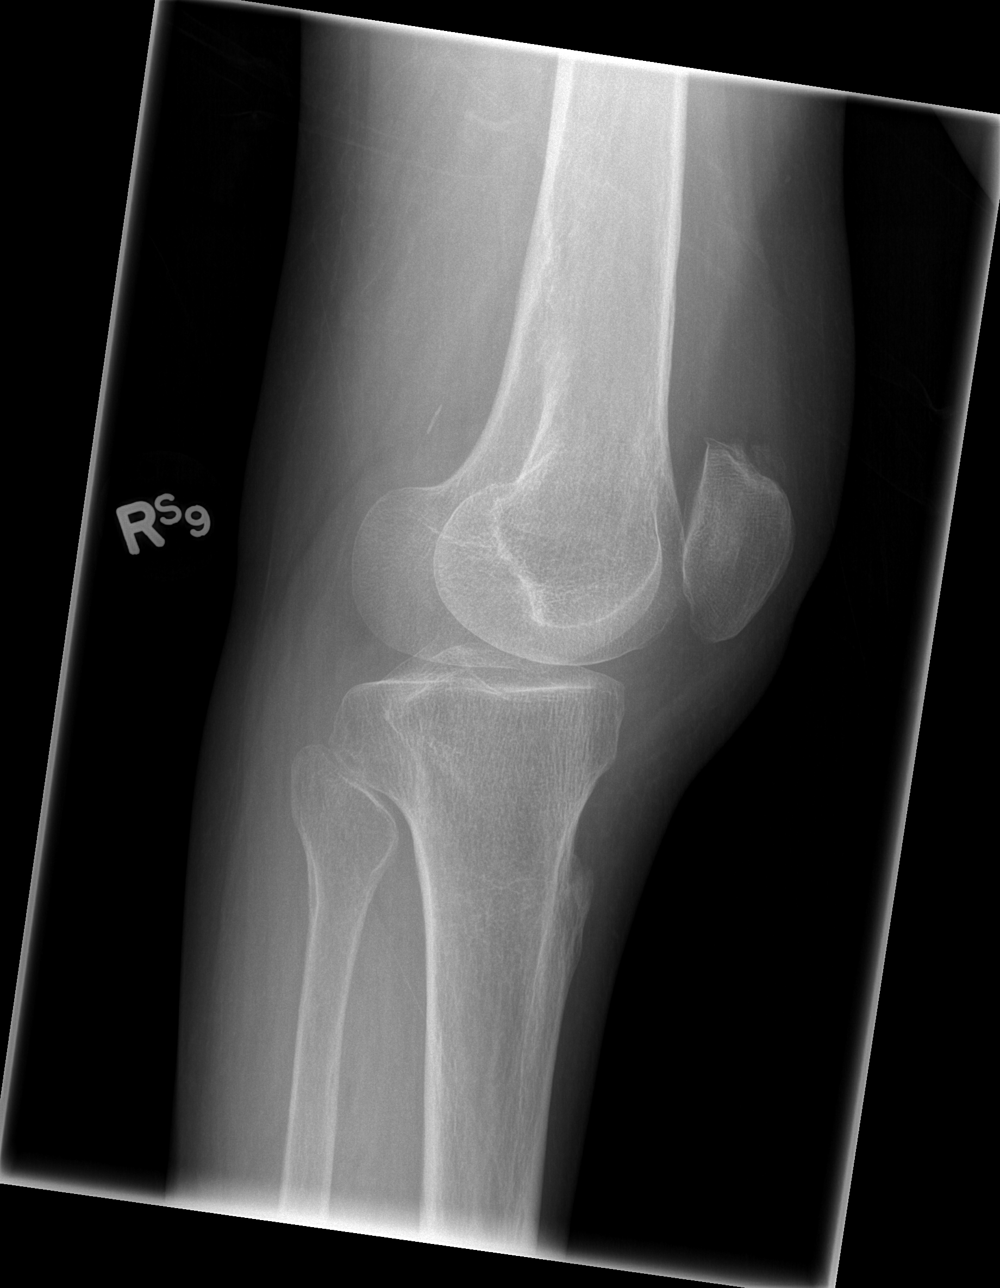

[t knee lat right]
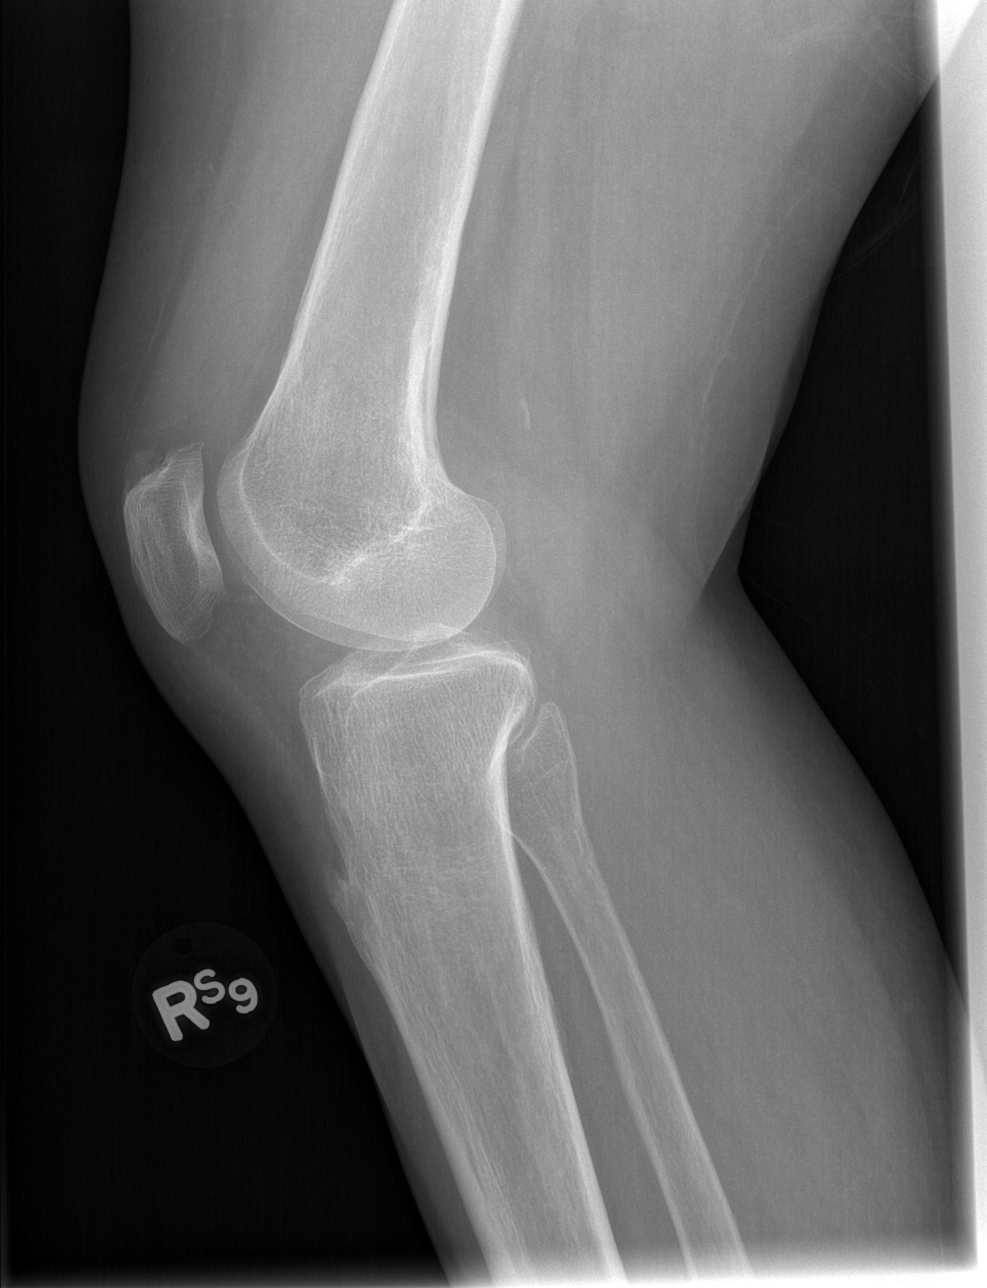

[4 of 4 positions shown; findings below may reference images not displayed]

FINDINGS: Frontal, lateral, and bilateral oblique views were obtained. There
is no fracture, dislocation, or effusion. Joint spaces appear intact
there is calcification along the distal insertion site of the
quadriceps tendon on the patella.
IMPRESSION: Probable distal quadriceps tendinosis.  No fracture or effusion.

## 2015-05-20 ENCOUNTER — Encounter: Payer: Self-pay | Admitting: Cardiology

## 2015-08-19 ENCOUNTER — Encounter: Payer: Self-pay | Admitting: Cardiology

## 2015-09-29 ENCOUNTER — Ambulatory Visit (INDEPENDENT_AMBULATORY_CARE_PROVIDER_SITE_OTHER): Payer: Medicare Other | Admitting: Cardiology

## 2015-09-29 ENCOUNTER — Encounter: Payer: Self-pay | Admitting: Cardiology

## 2015-09-29 VITALS — BP 140/60 | HR 89 | Ht 64.0 in | Wt 173.0 lb

## 2015-09-29 DIAGNOSIS — R079 Chest pain, unspecified: Secondary | ICD-10-CM

## 2015-09-29 DIAGNOSIS — R0989 Other specified symptoms and signs involving the circulatory and respiratory systems: Secondary | ICD-10-CM | POA: Diagnosis not present

## 2015-09-29 DIAGNOSIS — I10 Essential (primary) hypertension: Secondary | ICD-10-CM | POA: Diagnosis not present

## 2015-09-29 DIAGNOSIS — I6529 Occlusion and stenosis of unspecified carotid artery: Secondary | ICD-10-CM

## 2015-09-29 DIAGNOSIS — I5032 Chronic diastolic (congestive) heart failure: Secondary | ICD-10-CM

## 2015-09-29 HISTORY — DX: Occlusion and stenosis of unspecified carotid artery: I65.29

## 2015-09-29 NOTE — Patient Instructions (Signed)
Medication Instructions:  Your physician recommends that you continue on your current medications as directed. Please refer to the Current Medication list given to you today.   Labwork: None  Testing/Procedures: Your physician has requested that you have a carotid duplex. This test is an ultrasound of the carotid arteries in your neck. It looks at blood flow through these arteries that supply the brain with blood. Allow one hour for this exam. There are no restrictions or special instructions.  Follow-Up: Your physician wants you to follow-up in: 1 year with Dr. Radford Pax. You will receive a reminder letter in the mail two months in advance. If you don't receive a letter, please call our office to schedule the follow-up appointment.   Any Other Special Instructions Will Be Listed Below (If Applicable).     If you need a refill on your cardiac medications before your next appointment, please call your pharmacy.

## 2015-09-29 NOTE — Progress Notes (Signed)
Cardiology Office Note   Date:  09/29/2015   ID:  Dana, Fonti Jan 06, 1956, MRN [id number]  PCP:  Kevan Ny, MD    Chief Complaint  Patient presents with  . Follow-up    CHF, PT HAS NO COMPLAINTS      History of Present Illness: AVALYNN RASBURY is a 59 y.o. female who presents for followup of chest pain. The patient has a history of DM, HTN, OSA and ESRD on HD with ? history of CAD in medical records including cardiac cath in 2003 and stress test 2011 as per our records is negative, as well patient reports she had negative dobutamine stress test done in 07/31/2014 at [facility] as a part of her workup to be on a transplant list, patient reports stress test was normal. She apparently had CP during HD 09/01/2014 that was epigastric and left sided and did not radiate. There was no associated SOB/N/V or diaphoresis. She complained of belching and early satiety. She ruled out by serial tropoinins and EKG showed no ischemia. It was felt she had gastroparesis and no further cardiac workup indicated. Since I saw her last she has undergone a kidney transplant and is doing well She denies any LE edema, palpitations, chest pain, SOB, DOE, dizziness or syncope.     Past Medical History  Diagnosis Date  . Hypertension   . Hyperlipidemia   . Diabetes mellitus     diagnosed in 1988, on insulin.  . Anemia     2/2 renal failure  . Diabetic retinopathy     s/p PRP OS  . History of UTI   . Depression   . History of TIAs     10/08- twisted face and slurred speech without extremity weakness  . Bleeding     History of retinal bleed on full dose aspirin  . Arthritis     ? Hands  . CHF (congestive heart failure) ([place])   . Myocardial infarction [facility])     a. 2003 reportedly nl cath; b. 2011 neg MV; c. 07/2014 Dob Echo: nonischemic.  Marland Kitchen Hyperparathyroidism ([place])   . Thyroid disease   . Pneumonia     hx  . Obstructive sleep apnea     to have new  sleep study 04/25/13 does not use cpap  . Chronic diastolic CHF (congestive heart failure) ([place])     a. 11/2014 Echo: EF 55-60%, Gr 2 DD, Ao sclerosis, triv MR.  . ESRD on hemodialysis ([place])     Started HD in 2015    Past Surgical History  Procedure Laterality Date  . Hernia repair    . Cholecystectomy    . Abdominal hysterectomy    . Umbical hernia    . Eye surgery      Laser DM   . Av fistula placement  12/05/2011    Procedure: ARTERIOVENOUS (AV) FISTULA CREATION;  Surgeon: Elam Dutch, MD;  Location: [facility] OR;  Service: Vascular;  Laterality: Right;  Creation of Arteriovenous fistula right lower arm   . Revison of arteriovenous fistula Right 04/17/2013    Procedure: REVISON OF ARTERIOVENOUS FISTULA and superficialization;  Surgeon: Elam Dutch, MD;  Location: [place];  Service: Vascular;  Laterality: Right;  . Kidney transplant       Current Outpatient Prescriptions  Medication Sig Dispense Refill  . aspirin EC 81 MG tablet Take 1 tablet (81 mg  total) by mouth daily. 30 tablet 2  . HUMALOG KWIKPEN 100 UNIT/ML KiwkPen INJECT 2 UNITS SQ WITH EACH MEAL IN ADDITION TO CORRECTION SCALE [2-10 UNITS AS INSTRUCTED] (E11.65)  2  . insulin glargine (LANTUS) 100 UNIT/ML injection Inject 0.45 mLs (45 Units total) into the skin at bedtime. (Patient taking differently: Inject 20 Units into the skin at bedtime. ) 10 mL 11  . LANTUS SOLOSTAR 100 UNIT/ML Solostar Pen Inject 20 Units as directed daily.   2  . NIFEdipine (PROCARDIA XL/ADALAT-CC) 90 MG 24 hr tablet Take 1 tablet by mouth daily.  1  . sodium bicarbonate 650 MG tablet Take 2 tablets by mouth daily.  5  . sulfamethoxazole-trimethoprim (BACTRIM,SEPTRA) 400-80 MG tablet Take 1 tablet by mouth 3 (three) times a week.  11   No current facility-administered medications for this visit.    Allergies:   Codeine; Hydrocodone; Hydrocodone-acetaminophen; and Clonidine derivatives    Social History:  The patient  reports that she quit smoking  about 32 years ago. Her smoking use included Cigarettes. She has a 10 pack-year smoking history. She has never used smokeless tobacco. She reports that she does not drink alcohol or use illicit drugs.   Family History:  The patient's family history includes Cancer in her mother and sister; Diabetes in her father, mother, and son; Heart attack in her father and mother; Heart disease in her father, mother, and son; Hyperlipidemia in her father and mother; Hypertension in her father, maternal grandmother, mother, paternal grandmother, and son. There is no history of Colon cancer or Anesthesia problems.    ROS:  Please see the history of present illness.   Otherwise, review of systems are positive for none.   All other systems are reviewed and negative.    PHYSICAL EXAM: VS:  BP 140/60 mmHg  Pulse 89  Ht 5\' 4"  (1.626 m)  Wt 173 lb (78.472 kg)  BMI 29.68 kg/m2  SpO2 99% , BMI Body mass index is 29.68 kg/(m^2). GEN: Well nourished, well developed, in no acute distress HEENT: normal Neck: no JVD or masses.  Bilateral carotid artery bruits Cardiac: RRR; no murmurs, rubs, or gallops,no edema  Respiratory:  clear to auscultation bilaterally, normal work of breathing GI: soft, nontender, nondistended, + BS MS: no deformity or atrophy Skin: warm and dry, no rash Neuro:  Strength and sensation are intact Psych: euthymic mood, full affect   EKG:  EKG is not ordered today.    Recent Labs: 12/10/2014: ALT 25 12/12/2014: BUN 58*; Creatinine, Ser 6.28*; Hemoglobin 9.5*; Platelets 282; Potassium 4.3; Sodium 139    Lipid Panel    Component Value Date/Time   CHOL 151 12/11/2014 0401   TRIG 109 12/11/2014 0401   HDL 39* 12/11/2014 0401   CHOLHDL 3.9 12/11/2014 0401   VLDL 22 12/11/2014 0401   LDLCALC 90 12/11/2014 0401      Wt Readings from Last 3 Encounters:  09/29/15 173 lb (78.472 kg)  04/02/15 180 lb (81.647 kg)  01/22/15 185 lb 6.4 oz (84.097 kg)    ASSESSMENT AND PLAN:  1.  Atypical chest pain that has resolved.  Dobutamine stress echo showed no ischemia in October 2015.Gastric emptying study showed no gastroparesis. Her symptoms have resolved on Protonix.  2. HTN - contolled. Continue Coreg/ACE I/cardura and amlodipine 3. ESRD 4. DM per PCP 5.  Chronic diastolic CHF - appears euvolemic 6.  Bilateral carotid artery bruits    Current medicines are reviewed at length with the patient today.  The patient does not have concerns regarding medicines.  The following changes have been made:  no change  Labs/ tests ordered today: See above Assessment and Plan No orders of the defined types were placed in this encounter.     Disposition:   FU with me in 1 year  Signed, Sueanne Margarita, MD  09/29/2015 8:25 AM    [facility] [place], [place], [place]  [address] Phone: [phone]; Fax: [phone]

## 2015-10-06 ENCOUNTER — Ambulatory Visit (HOSPITAL_COMMUNITY)
Admission: RE | Admit: 2015-10-06 | Discharge: 2015-10-06 | Disposition: A | Discharge status: Home / Self Care | Payer: Medicare Other | Source: Ambulatory Visit | Attending: Cardiology | Admitting: Cardiology

## 2015-10-06 ENCOUNTER — Telehealth: Payer: Self-pay | Admitting: Cardiology

## 2015-10-06 DIAGNOSIS — E119 Type 2 diabetes mellitus without complications: Secondary | ICD-10-CM | POA: Diagnosis not present

## 2015-10-06 DIAGNOSIS — N186 End stage renal disease: Secondary | ICD-10-CM | POA: Diagnosis not present

## 2015-10-06 DIAGNOSIS — E785 Hyperlipidemia, unspecified: Secondary | ICD-10-CM | POA: Diagnosis not present

## 2015-10-06 DIAGNOSIS — R0989 Other specified symptoms and signs involving the circulatory and respiratory systems: Secondary | ICD-10-CM | POA: Insufficient documentation

## 2015-10-06 DIAGNOSIS — I120 Hypertensive chronic kidney disease with stage 5 chronic kidney disease or end stage renal disease: Secondary | ICD-10-CM | POA: Diagnosis not present

## 2015-10-06 DIAGNOSIS — I6523 Occlusion and stenosis of bilateral carotid arteries: Secondary | ICD-10-CM | POA: Diagnosis not present

## 2015-10-06 DIAGNOSIS — Z992 Dependence on renal dialysis: Secondary | ICD-10-CM | POA: Diagnosis not present

## 2015-10-06 NOTE — Telephone Encounter (Signed)
Form received with updated med list. Med list corrected in EPIC.

## 2015-10-06 NOTE — Addendum Note (Signed)
Addended by: Harland German A on: 10/06/2015 03:26 PM   Modules accepted: Orders, Medications

## 2015-10-06 NOTE — Telephone Encounter (Signed)
Walk in patient form dropped off medical list. Will give to [facility] 10/06/15 fbg.

## 2015-10-07 ENCOUNTER — Encounter: Payer: Self-pay | Admitting: Cardiology

## 2015-10-07 ENCOUNTER — Telehealth: Payer: Self-pay

## 2015-10-07 DIAGNOSIS — I6529 Occlusion and stenosis of unspecified carotid artery: Secondary | ICD-10-CM

## 2015-10-07 NOTE — Telephone Encounter (Signed)
Informed patient of results and verbal understanding expressed.  Repeat carotids ordered to be scheduled in 1 year. Patient agrees with treatment plan. 

## 2015-10-07 NOTE — Telephone Encounter (Signed)
-----   Message from Sueanne Margarita, MD sent at 10/07/2015 10:52 AM EST ----- Heterogeneous plaque, bilaterally.  Stable 40-59% RICA stenosis.  Stable 123456 LICA stenosis. Elevated right subclavian artery velocities. Patent vertebral arteries with antegrade flow.  Repeat study in 1 year

## 2016-05-14 IMAGING — CT CT HEAD W/O CM
1 of 2 series · 16 of 30 positions shown, 20 images · non-contrast
Comparison: 11/12/2013

CLINICAL DATA: Possible ongoing stroke

EXAM:
CT HEAD WITHOUT CONTRAST
TECHNIQUE: Contiguous axial images were obtained from the base of the skull
through the vertex without intravenous contrast.

[Series 3: head 2.0 h70h · axial · 0.41mm/px · z∈[-51,+95]mm · 16 of 83 slices shown, 20 images]
[im 5/83  brain]
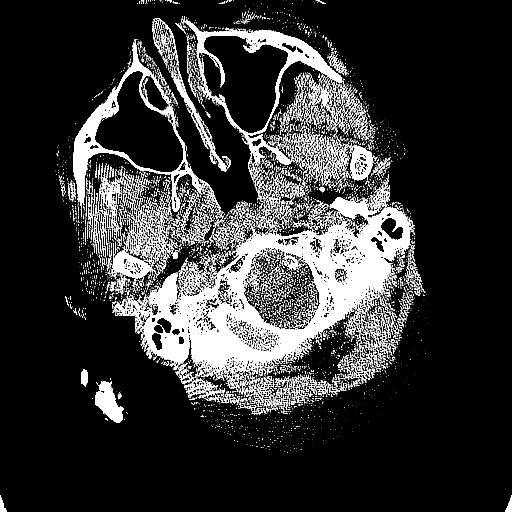
[im 5/83  bone]
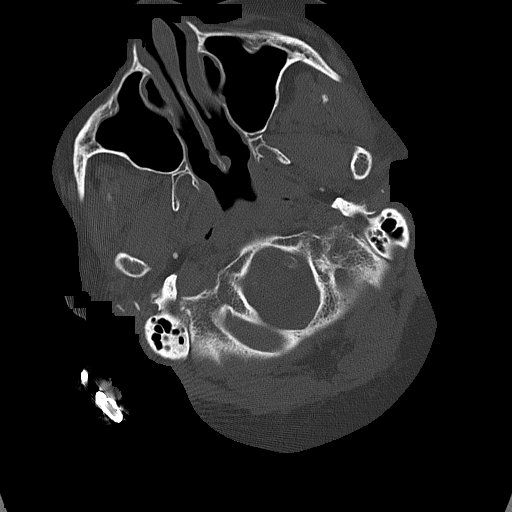
[im 9/83  brain]
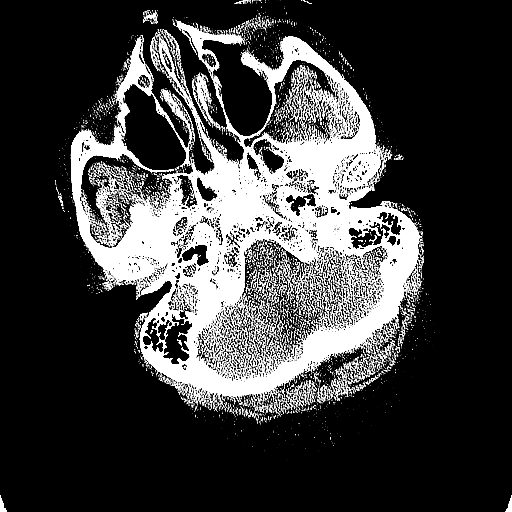
[im 13/83  brain]
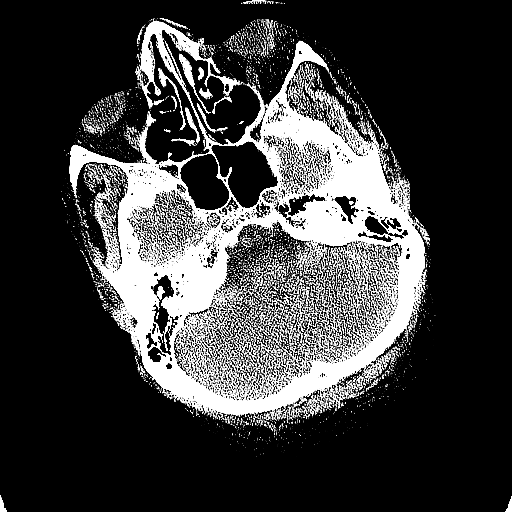
[im 21/83  brain]
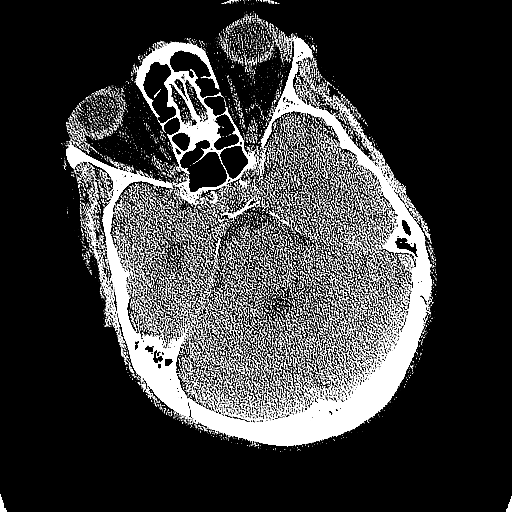
[im 25/83  brain]
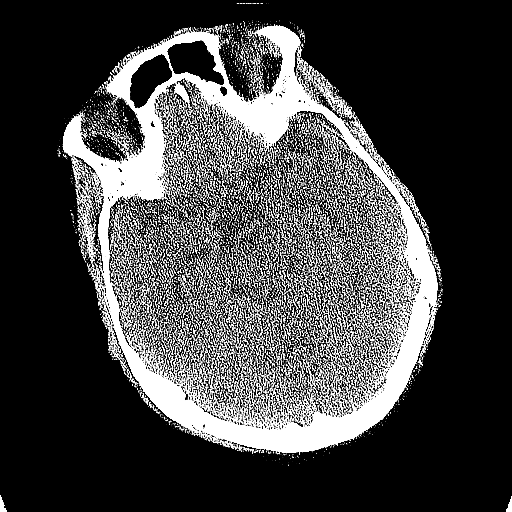
[im 25/83  bone]
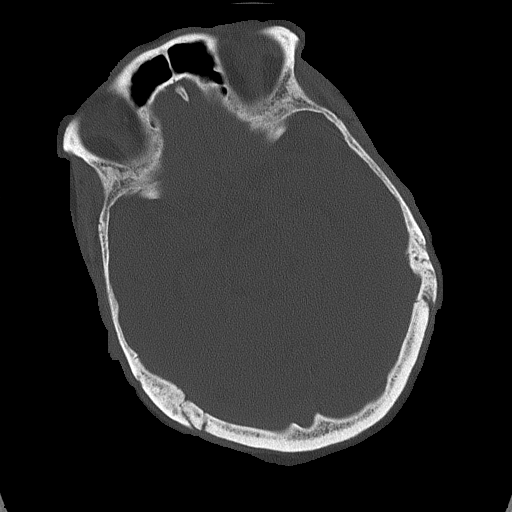
[im 29/83  brain]
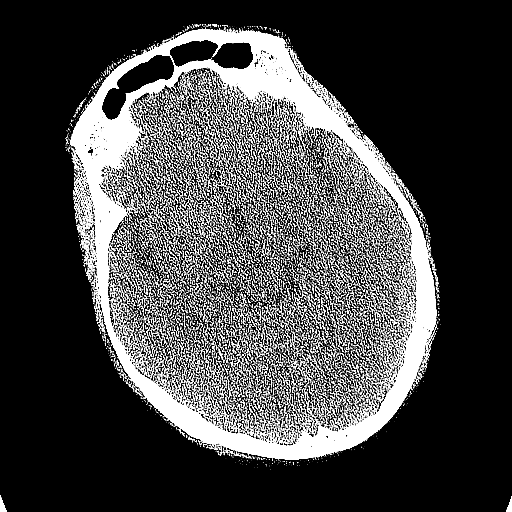
[im 33/83  brain]
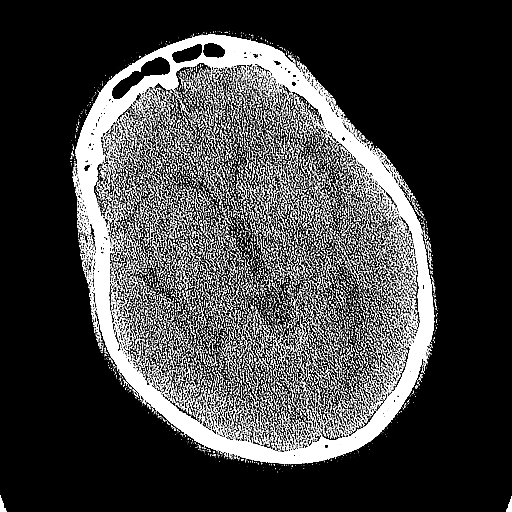
[im 37/83  brain]
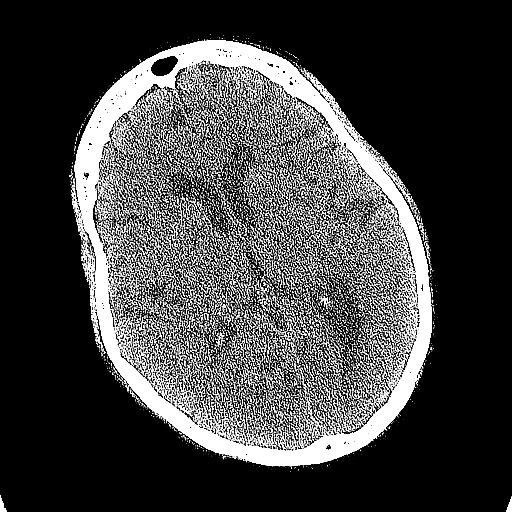
[im 46/83  brain]
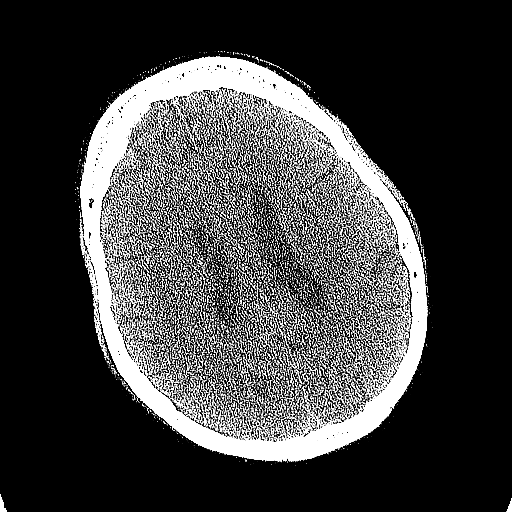
[im 46/83  bone]
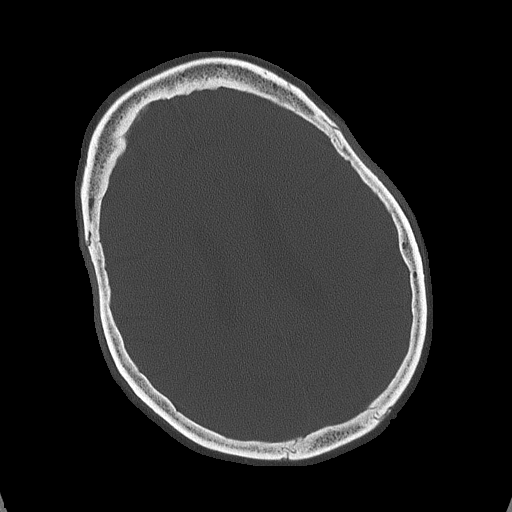
[im 50/83  brain]
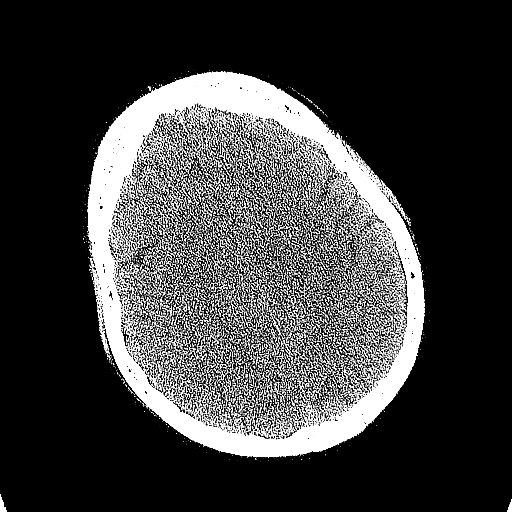
[im 54/83  brain]
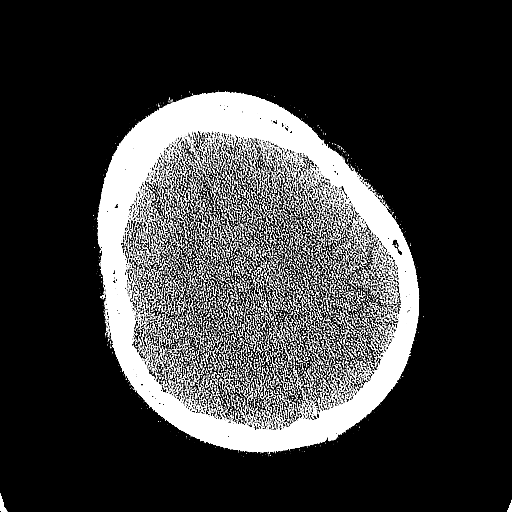
[im 58/83  brain]
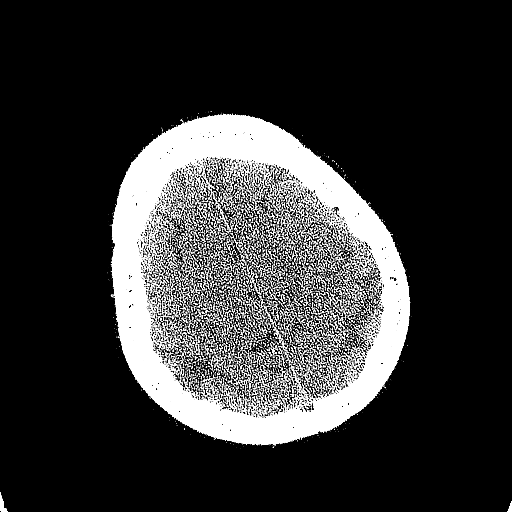
[im 62/83  brain]
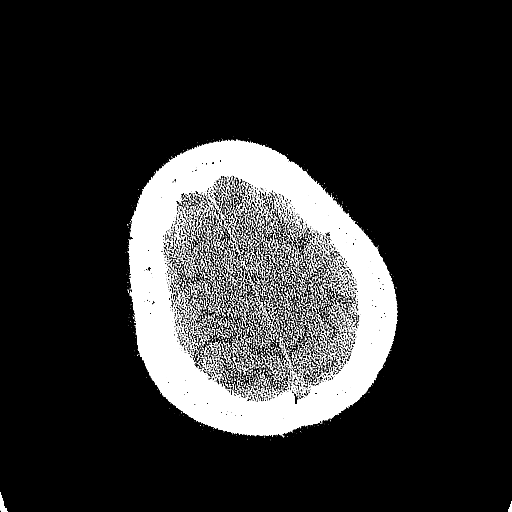
[im 62/83  bone]
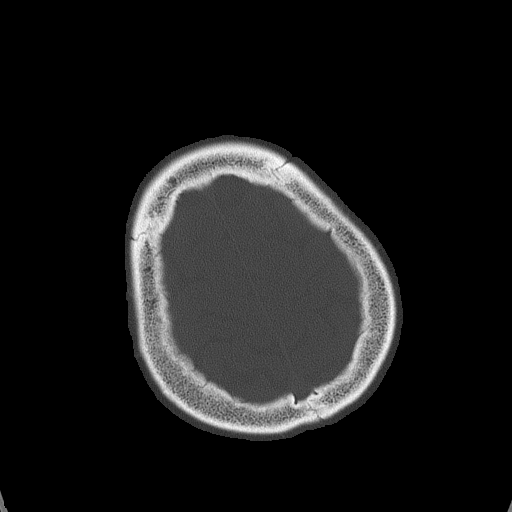
[im 70/83  brain]
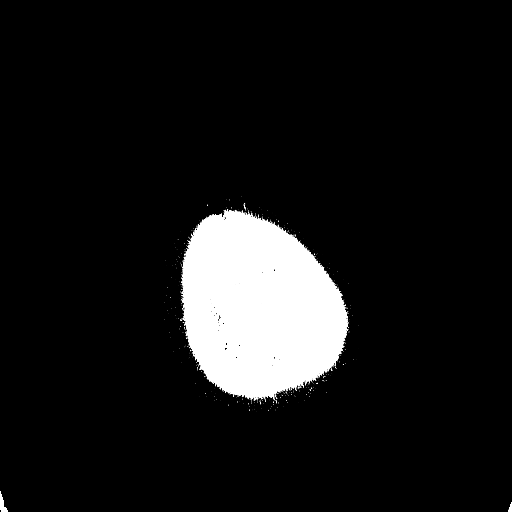
[im 74/83  brain]
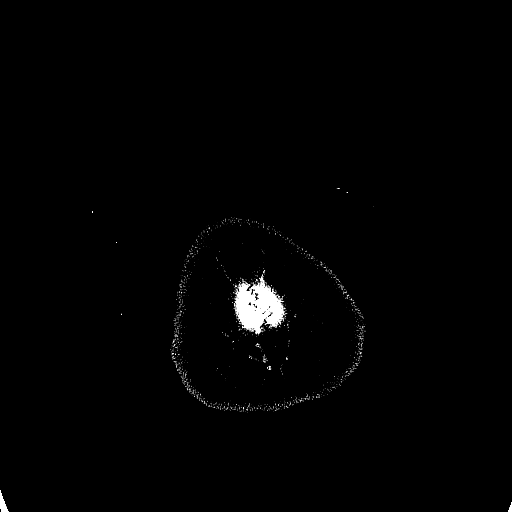
[im 78/83  brain]
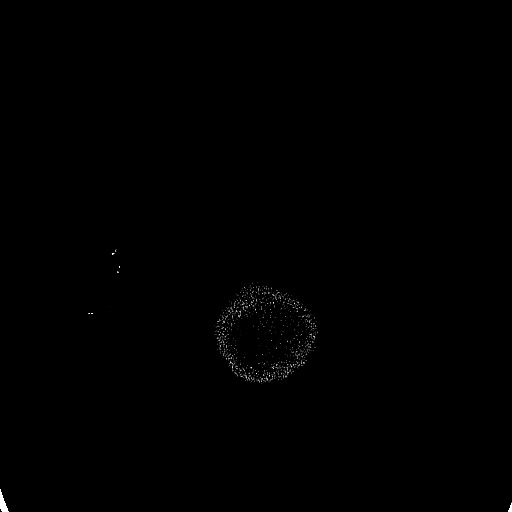

[16 of 30 positions shown; findings below may reference images not displayed]

FINDINGS: The bony calvarium is intact. No gross soft tissue abnormality is
noted. No acute hemorrhage, acute infarction or space-occupying mass
lesion is identified. Mild atrophic changes are noted.
IMPRESSION: Mild atrophic changes.  No acute abnormality is noted.

## 2016-10-07 ENCOUNTER — Ambulatory Visit (HOSPITAL_COMMUNITY)
Admission: RE | Admit: 2016-10-07 | Discharge: 2016-10-07 | Disposition: A | Discharge status: Home / Self Care | Payer: Medicare Other | Source: Ambulatory Visit | Attending: Cardiology | Admitting: Cardiology

## 2016-10-07 DIAGNOSIS — E119 Type 2 diabetes mellitus without complications: Secondary | ICD-10-CM | POA: Diagnosis not present

## 2016-10-07 DIAGNOSIS — E785 Hyperlipidemia, unspecified: Secondary | ICD-10-CM | POA: Diagnosis not present

## 2016-10-07 DIAGNOSIS — I6529 Occlusion and stenosis of unspecified carotid artery: Secondary | ICD-10-CM

## 2016-10-07 DIAGNOSIS — Z8673 Personal history of transient ischemic attack (TIA), and cerebral infarction without residual deficits: Secondary | ICD-10-CM | POA: Insufficient documentation

## 2016-10-07 DIAGNOSIS — I10 Essential (primary) hypertension: Secondary | ICD-10-CM | POA: Diagnosis not present

## 2016-10-07 DIAGNOSIS — Z87891 Personal history of nicotine dependence: Secondary | ICD-10-CM | POA: Diagnosis not present

## 2016-10-07 DIAGNOSIS — I6523 Occlusion and stenosis of bilateral carotid arteries: Secondary | ICD-10-CM | POA: Diagnosis not present

## 2016-10-13 ENCOUNTER — Telehealth: Payer: Self-pay

## 2016-10-13 DIAGNOSIS — E785 Hyperlipidemia, unspecified: Secondary | ICD-10-CM

## 2016-10-13 NOTE — Telephone Encounter (Signed)
-----   Message from Sueanne Margarita, MD sent at 10/12/2016 11:00 PM EST ----- Please have her come in for FLP and ALT  Traci Turner ----- Message ----- From: Theodoro Parma, RN Sent: 10/12/2016  10:58 AM To: Sueanne Margarita, MD  Informed patient of results and verbal understanding expressed.  Patient states she was on Simvastatin a few years ago but it was discontinued by her PCP at the time because it interfered with another medication she was taking. She did not have any problems/side effects from the Simvastatin. She understands she will be called with Dr. Theodosia Blender medication recommendations.

## 2016-10-13 NOTE — Telephone Encounter (Signed)
Per Dr. Radford Pax, scheduled patient for fasting FLP and ALT Tuesday, 10/18/16. Patient agrees with treatment plan.

## 2016-10-18 ENCOUNTER — Other Ambulatory Visit: Payer: Medicare Other | Admitting: *Deleted

## 2016-10-19 LAB — LIPID PANEL
Chol/HDL Ratio: 4.6 ratio — ABNORMAL HIGH (ref 0.0–4.4)
Cholesterol, Total: 255 mg/dL — ABNORMAL HIGH (ref 100–199)
HDL: 55 mg/dL
LDL Calculated: 169 mg/dL — ABNORMAL HIGH (ref 0–99)
Triglycerides: 156 mg/dL — ABNORMAL HIGH (ref 0–149)
VLDL Cholesterol Cal: 31 mg/dL (ref 5–40)

## 2016-10-19 LAB — ALT: ALT: 25 IU/L (ref 0–32)

## 2016-10-20 ENCOUNTER — Telehealth: Payer: Self-pay

## 2016-10-20 DIAGNOSIS — E785 Hyperlipidemia, unspecified: Secondary | ICD-10-CM

## 2016-10-20 MED ORDER — ATORVASTATIN CALCIUM 20 MG PO TABS: 20.0000 mg | ORAL_TABLET | Freq: Every day | ORAL | 11 refills | Status: DC

## 2016-10-20 NOTE — Telephone Encounter (Signed)
Instructed patient to START LIPITOR 20 mg daily. She understands not to take Lipitor and bicarb within the same two hour period. Patient has OV with Dr. Radford Pax 2/22. She will come fasting for lab work that day. Patient agrees with treatment plan.   Patient is followed by [facility] for kidneys. Her lab work is in Care Everywhere - last creatinine 10/06/16 was 1.80.

## 2016-10-20 NOTE — Telephone Encounter (Signed)
-----   Message from Sueanne Margarita, MD sent at 10/19/2016  3:05 PM EST ----- Start Lipitor 20mg  daily and repeat FLP and ALT in 6 weeks.  Please find out who her renal MD is and get recent labs

## 2016-11-08 ENCOUNTER — Ambulatory Visit: Payer: Medicare Other | Admitting: Cardiology

## 2016-12-08 ENCOUNTER — Encounter: Payer: Self-pay | Admitting: Cardiology

## 2016-12-08 ENCOUNTER — Encounter (INDEPENDENT_AMBULATORY_CARE_PROVIDER_SITE_OTHER): Payer: Self-pay

## 2016-12-08 ENCOUNTER — Ambulatory Visit (INDEPENDENT_AMBULATORY_CARE_PROVIDER_SITE_OTHER): Payer: Medicare Other | Admitting: Cardiology

## 2016-12-08 VITALS — BP 156/70 | HR 85 | Ht 64.0 in | Wt 187.1 lb

## 2016-12-08 DIAGNOSIS — R079 Chest pain, unspecified: Secondary | ICD-10-CM | POA: Diagnosis not present

## 2016-12-08 DIAGNOSIS — I10 Essential (primary) hypertension: Secondary | ICD-10-CM

## 2016-12-08 DIAGNOSIS — I5032 Chronic diastolic (congestive) heart failure: Secondary | ICD-10-CM

## 2016-12-08 DIAGNOSIS — I6523 Occlusion and stenosis of bilateral carotid arteries: Secondary | ICD-10-CM | POA: Diagnosis not present

## 2016-12-08 DIAGNOSIS — E7800 Pure hypercholesterolemia, unspecified: Secondary | ICD-10-CM | POA: Diagnosis not present

## 2016-12-08 DIAGNOSIS — N186 End stage renal disease: Secondary | ICD-10-CM | POA: Diagnosis not present

## 2016-12-08 DIAGNOSIS — I6529 Occlusion and stenosis of unspecified carotid artery: Secondary | ICD-10-CM | POA: Insufficient documentation

## 2016-12-08 NOTE — Progress Notes (Signed)
Cardiology Office Note    Date:  12/08/2016   ID:  Rheagan, Nayak 1956/07/11, MRN [id number]  PCP:  Rogers Blocker, MD  Cardiologist:  Fransico Him, MD   Chief Complaint  Patient presents with  . Hypertension  . Hyperlipidemia    History of Present Illness:  [name] is a 61 y.o. female with a history of DM, HTN, OSA and ESRD on HD and dobutamine stress test done in 07/31/2014 at [facility] as a part of her workup to be on a transplant list, patient reports stress test was normal. She is here today for followup.  She had a kidney transplant 04/2015 and is doing well.  She has had a chronic cough since April after getting PNA.  She denies any LE edema, palpitations, chest pain, dizziness or syncope.  SHe has had some SOB with her cough but it is minimal.      Past Medical History:  Diagnosis Date  . Anemia    2/2 renal failure  . Arthritis    ? Hands  . Bleeding    History of retinal bleed on full dose aspirin  . Carotid artery stenosis    1-39% bilateral stenosis by dopplers 09/2016  . Chronic diastolic CHF (congestive heart failure) ([place])    a. 11/2014 Echo: EF 55-60%, Gr 2 DD, Ao sclerosis, triv MR.  . Depression   . Diabetes mellitus    diagnosed in 1988, on insulin.  . Diabetic retinopathy    s/p PRP OS  . ESRD on hemodialysis [facility])    s/p kidney transplant 2016  . History of TIAs    10/08- twisted face and slurred speech without extremity weakness  . History of UTI   . Hyperlipidemia   . Hyperparathyroidism ([place])   . Hypertension   . Myocardial infarction    a. 2003 reportedly nl cath; b. 2011 neg MV; c. 07/2014 Dob Echo: nonischemic.  Marland Kitchen Obstructive sleep apnea    not on CPAP  . Pneumonia    hx  . Thyroid disease     Past Surgical History:  Procedure Laterality Date  . ABDOMINAL HYSTERECTOMY    . AV FISTULA PLACEMENT  12/05/2011   Procedure: ARTERIOVENOUS (AV) FISTULA CREATION;  Surgeon: Elam Dutch, MD;  Location: [place];   Service: Vascular;  Laterality: Right;  Creation of Arteriovenous fistula right lower arm   . CHOLECYSTECTOMY    . EYE SURGERY     Laser DM   . HERNIA REPAIR    . KIDNEY TRANSPLANT    . REVISON OF ARTERIOVENOUS FISTULA Right 04/17/2013   Procedure: REVISON OF ARTERIOVENOUS FISTULA and superficialization;  Surgeon: Elam Dutch, MD;  Location: [facility] OR;  Service: Vascular;  Laterality: Right;  . Umbical Hernia      Current Medications: Current Meds  Medication Sig  . aspirin EC 81 MG tablet Take 1 tablet (81 mg total) by mouth daily.  Marland Kitchen atorvastatin (LIPITOR) 20 MG tablet Take 1 tablet (20 mg total) by mouth daily at 6 PM. Do not take within two hours of taking sodium bicarbonate.  . belatacept (NULOJIX) 250 MG SOLR injection Inject 400 mg into the vein every 30 (thirty) days.  . insulin lispro (HUMALOG) 100 UNIT/ML injection Inject 4 Units into the skin 3 (three) times daily before meals.  Marland Kitchen labetalol (NORMODYNE) 200 MG tablet Take 400 mg by mouth 3 (three) times daily.  . mycophenolate (MYFORTIC) 180 MG EC tablet Take 360  mg by mouth 2 (two) times daily.  Marland Kitchen NIFEdipine (PROCARDIA XL/ADALAT-CC) 90 MG 24 hr tablet Take 1 tablet by mouth daily.  Marland Kitchen omeprazole (PRILOSEC) 20 MG capsule Take 20 mg by mouth daily.  . predniSONE (DELTASONE) 5 MG tablet Take 15 mg by mouth daily with breakfast.  . sodium bicarbonate 650 MG tablet Take 2 tablets by mouth daily.  Marland Kitchen sulfamethoxazole-trimethoprim (BACTRIM,SEPTRA) 400-80 MG tablet Take 1 tablet by mouth 3 (three) times a week.  . valGANciclovir (VALCYTE) 450 MG tablet Take 450 mg by mouth every Monday, Wednesday, and Friday.    Allergies:   Codeine; Hydrocodone; Hydrocodone-acetaminophen; and Clonidine derivatives   Social History   Social History  . Marital status: Married    Spouse name: Mateo Flow  . Number of children: 2  . Years of education: 12   Occupational History  . [employer]   Social History Main Topics  . Smoking  status: Former Smoker    Packs/day: 1.00    Years: 10.00    Types: Cigarettes    Quit date: 01/12/1983  . Smokeless tobacco: Never Used  . Alcohol use No  . Drug use: No  . Sexual activity: Not Asked   Other Topics Concern  . None   Social History Narrative   Recently unemployed from the health department, and she is actively looking for work. She lives with husband( monogamous sexual partner) and 2 grandsons. She has 2 adult children. Patient is receiving unemployment at the moment; her husband is unemployed.     Family History:  The patient's family history includes Cancer in her mother and sister; Diabetes in her father, mother, and son; Heart attack in her father and mother; Heart disease in her father, mother, and son; Hyperlipidemia in her father and mother; Hypertension in her father, maternal grandmother, mother, paternal grandmother, and son.   ROS:   Please see the history of present illness.    ROS All other systems reviewed and are negative.  No flowsheet data found.     PHYSICAL EXAM:   VS:  BP (!) 156/70   Pulse 85   Ht 5\' 4"  (1.626 m)   Wt 187 lb 1.9 oz (84.9 kg)   BMI 32.12 kg/m    GEN: Well nourished, well developed, in no acute distress  HEENT: normal  Neck: no JVD, carotid bruits, or masses Cardiac: RRR; no murmurs, rubs, or gallops,no edema.  Intact distal pulses bilaterally.  Respiratory:  clear to auscultation bilaterally, normal work of breathing GI: soft, nontender, nondistended, + BS MS: no deformity or atrophy  Skin: warm and dry, no rash Neuro:  Alert and Oriented x 3, Strength and sensation are intact Psych: euthymic mood, full affect  Wt Readings from Last 3 Encounters:  12/08/16 187 lb 1.9 oz (84.9 kg)  09/29/15 173 lb (78.5 kg)  04/02/15 180 lb (81.6 kg)      Studies/Labs Reviewed:   EKG:  EKG is ordered today.  The ekg ordered today demonstrates NSR at 85bpm with no ST changes and LVH by voltage  Recent Labs: 10/18/2016: ALT 25    Lipid Panel    Component Value Date/Time   CHOL 255 (H) 10/18/2016 0753   TRIG 156 (H) 10/18/2016 0753   HDL 55 10/18/2016 0753   CHOLHDL 4.6 (H) 10/18/2016 0753   CHOLHDL 3.9 12/11/2014 0401   VLDL 22 12/11/2014 0401   LDLCALC 169 (H) 10/18/2016 0753    Additional studies/ records that were reviewed today include:  none  ASSESSMENT:    1. Chest pain, unspecified type   2. Essential hypertension   3. ESRD (end stage renal disease) ([place])   4. Bilateral carotid artery stenosis   5. Pure hypercholesterolemia   6. Chronic diastolic CHF (congestive heart failure) ([facility])      PLAN:  In order of problems listed above:   1. Atypical chest pain that has resolved.  Dobutamine stress echo showed no ischemia in October 2015.Gastric emptying study showed no gastroparesis. Her symptoms have resolved on Protonix.  2. HTN - borderline contolled. Continue Labetolol and procardia.  I have asked her to check her BP daily for a week and call with results. 3. ESRD - followed by renal 4.  Bilateral carotid artery bruits (1-39% bilateral stenosis by dopplers 09/2016).  Continue statin and ASA.   5.  Hyperlipidemia with LDL goal < 70 due to #6.  She was just started on Liptor and has FLP pending.  6.  Chronic diastolic CHF - appears euvolemic    Medication Adjustments/Labs and Tests Ordered: Current medicines are reviewed at length with the patient today.  Concerns regarding medicines are outlined above.  Medication changes, Labs and Tests ordered today are listed in the Patient Instructions below.  There are no Patient Instructions on file for this visit.   Signed, Fransico Him, MD  12/08/2016 8:52 AM    [place] [address], [place], [place]  [address] Phone: [phone]; Fax: [phone]

## 2016-12-08 NOTE — Patient Instructions (Signed)
Medication Instructions:  Your physician recommends that you continue on your current medications as directed. Please refer to the Current Medication list given to you today.   Labwork: Please return for FASTING LABs.  Testing/Procedures: None  Follow-Up: Your physician wants you to follow-up in: 1 year with Dr. Radford Pax. You will receive a reminder letter in the mail two months in advance. If you don't receive a letter, please call our office to schedule the follow-up appointment.   Any Other Special Instructions Will Be Listed Below (If Applicable). Please check your BLOOD PRESSURE daily for a week and call with results.    If you need a refill on your cardiac medications before your next appointment, please call your pharmacy.

## 2016-12-12 ENCOUNTER — Other Ambulatory Visit: Payer: Medicare Other | Admitting: *Deleted

## 2016-12-12 DIAGNOSIS — E7800 Pure hypercholesterolemia, unspecified: Secondary | ICD-10-CM

## 2016-12-12 LAB — LIPID PANEL
CHOL/HDL RATIO: 3.2 ratio (ref 0.0–4.4)
Cholesterol, Total: 155 mg/dL (ref 100–199)
HDL: 49 mg/dL (ref >39)
LDL CALC: 86 mg/dL (ref 0–99)
Triglycerides: 99 mg/dL (ref 0–149)
VLDL Cholesterol Cal: 20 mg/dL (ref 5–40)

## 2016-12-12 LAB — HEPATIC FUNCTION PANEL
ALBUMIN: 4.2 g/dL (ref 3.6–4.8)
ALK PHOS: 194 IU/L — AB (ref 39–117)
ALT: 27 IU/L (ref 0–32)
AST: 11 IU/L (ref 0–40)
BILIRUBIN, DIRECT: 0.07 mg/dL (ref 0.00–0.40)
Bilirubin Total: 0.3 mg/dL (ref 0.0–1.2)
TOTAL PROTEIN: 6.5 g/dL (ref 6.0–8.5)

## 2016-12-15 ENCOUNTER — Telehealth: Payer: Self-pay

## 2016-12-15 DIAGNOSIS — E785 Hyperlipidemia, unspecified: Secondary | ICD-10-CM

## 2016-12-15 MED ORDER — ATORVASTATIN CALCIUM 40 MG PO TABS: 40.0000 mg | ORAL_TABLET | Freq: Every day | ORAL | 11 refills | Status: DC

## 2016-12-15 NOTE — Telephone Encounter (Signed)
-----   Message from Sueanne Margarita, MD sent at 12/12/2016  8:03 PM EST ----- LDL not at goal.  Increase Lipitor to 40mg  daily and repeat FLP and ALT in 6 weeks

## 2016-12-15 NOTE — Telephone Encounter (Signed)
Informed patient of results and verbal understanding expressed.  Instructed patient to INCREASE LIPITOR to 40 mg daily. FLP and ALT scheduled 4/20. Patient agrees with treatment plan.

## 2017-01-31 ENCOUNTER — Telehealth: Payer: Self-pay | Admitting: Cardiology

## 2017-01-31 NOTE — Telephone Encounter (Signed)
New message    Pt is calling asking if she can have the blood work she needs here done at her doctor appt Friday at [place].

## 2017-01-31 NOTE — Telephone Encounter (Signed)
Patient does not know the name of the clinic, phone or fax number to send lab orders. She will check and have her social worker call with clinic name and fax number to send lab orders. She was grateful for assistance.

## 2017-01-31 NOTE — Telephone Encounter (Signed)
[name] called from the Transplant Center at [facility] to get verbal orders for lab work. Gave verbal order for patient to have FLP and ALT drawn for hyperlipidemia. She will fax results to [phone]. She was grateful for assistance.

## 2017-02-03 ENCOUNTER — Other Ambulatory Visit: Payer: Medicare Other

## 2017-12-02 ENCOUNTER — Other Ambulatory Visit: Payer: Self-pay | Admitting: Cardiology

## 2017-12-05 ENCOUNTER — Other Ambulatory Visit: Payer: Self-pay | Admitting: Cardiology

## 2017-12-05 MED ORDER — ATORVASTATIN CALCIUM 40 MG PO TABS: ORAL_TABLET | ORAL | 1 refills | Status: AC

## 2017-12-05 NOTE — Telephone Encounter (Signed)
Pt's medication was sent to pt's pharmacy as requested. Confirmation received.  °

## 2018-01-21 NOTE — Progress Notes (Addendum)
Cardiology Office Note:    Date:  01/22/2018   ID:  [name], DOB Feb 01, 1956, MRN [id number]  PCP:  Rogers Blocker, MD  Cardiologist:  No primary care provider on file.    Referring MD: Rogers Blocker, MD   Chief Complaint  Patient presents with  . Hypertension  . Hyperlipidemia  . Congestive Heart Failure    History of Present Illness:    [name] is a 62 y.o. female with a hx of DM, HTN, bilateral carotid artery stenosis, hyperlipidemia, chronic diastolic CHF and ESRD on HD s/p kidney transplant 04/2015. She is here today for followup and is doing well.  She denies any SOB, DOE, PND, orthopnea, LE edema, dizziness, palpitations or syncope. She says that her brother was murdered a month ago and she started having pain in her chest starting in her neck and then went into her back and chest that would be off and on for 3 days. She denied any associated sx.  She described it as a stabbing pain.  It resolved on its own and she has not had any since then.  She is compliant with her meds and is tolerating meds with no SE.      Past Medical History:  Diagnosis Date  . Anemia    2/2 renal failure  . Arthritis    ? Hands  . Bleeding    History of retinal bleed on full dose aspirin  . Carotid artery stenosis    1-39% bilateral stenosis by dopplers 09/2016  . Chronic diastolic CHF (congestive heart failure) ([place])    a. 11/2014 Echo: EF 55-60%, Gr 2 DD, Ao sclerosis, triv MR.  . Depression   . Diabetes mellitus    diagnosed in 1988, on insulin.  . Diabetic retinopathy    s/p PRP OS  . ESRD on hemodialysis [facility])    s/p kidney transplant 2016  . History of TIAs    10/08- twisted face and slurred speech without extremity weakness  . History of UTI   . Hyperlipidemia   . Hyperparathyroidism ([place])   . Hypertension   . Myocardial infarction [facility])    a. 2003 reportedly nl cath; b. 2011 neg MV; c. 07/2014 Dob Echo: nonischemic.  Marland Kitchen Obstructive sleep apnea    not on CPAP  . Pneumonia     hx  . Thyroid disease     Past Surgical History:  Procedure Laterality Date  . ABDOMINAL HYSTERECTOMY    . AV FISTULA PLACEMENT  12/05/2011   Procedure: ARTERIOVENOUS (AV) FISTULA CREATION;  Surgeon: Elam Dutch, MD;  Location: [facility];  Service: Vascular;  Laterality: Right;  Creation of Arteriovenous fistula right lower arm   . CHOLECYSTECTOMY    . EYE SURGERY     Laser DM   . HERNIA REPAIR    . KIDNEY TRANSPLANT    . REVISON OF ARTERIOVENOUS FISTULA Right 04/17/2013   Procedure: REVISON OF ARTERIOVENOUS FISTULA and superficialization;  Surgeon: Elam Dutch, MD;  Location: [facility] OR;  Service: Vascular;  Laterality: Right;  . Umbical Hernia      Current Medications: Current Meds  Medication Sig  . aspirin EC 81 MG tablet Take 1 tablet (81 mg total) by mouth daily.  Marland Kitchen atorvastatin (LIPITOR) 40 MG tablet Take 1 tablet by mouth daily at 6 p.m. Do not take within two hours of taking sodium bicarbonate. Please keep upcoming appt. Thanks  . belatacept (NULOJIX) 250 MG SOLR injection Inject 400 mg into  the vein every 30 (thirty) days.  . insulin lispro (HUMALOG) 100 UNIT/ML injection Inject 8 Units into the skin 3 (three) times daily before meals.   Marland Kitchen labetalol (NORMODYNE) 200 MG tablet Take 400 mg by mouth 3 (three) times daily.  . mycophenolate (MYFORTIC) 180 MG EC tablet Take 360 mg by mouth 2 (two) times daily.  Marland Kitchen NIFEdipine (PROCARDIA XL/ADALAT-CC) 90 MG 24 hr tablet Take 1 tablet by mouth daily.  Marland Kitchen omeprazole (PRILOSEC) 20 MG capsule Take 20 mg by mouth daily.  . predniSONE (DELTASONE) 5 MG tablet Take 15 mg by mouth daily with breakfast.  . sodium bicarbonate 650 MG tablet Take 2 tablets by mouth 3 (three) times daily.   Marland Kitchen sulfamethoxazole-trimethoprim (BACTRIM,SEPTRA) 400-80 MG tablet Take 1 tablet by mouth 3 (three) times a week.  . valGANciclovir (VALCYTE) 450 MG tablet Take 450 mg by mouth every Monday, Wednesday, and Friday.     Allergies:   Codeine; Hydrocodone;  Hydrocodone-acetaminophen; and Clonidine derivatives   Social History   Socioeconomic History  . Marital status: Married    Spouse name: Mateo Flow  . Number of children: 2  . Years of education: 29  . Highest education level: Not on file  Occupational History  . Occupation: Garment/textile technologist: [employer]  Social Needs  . Financial resource strain: Not on file  . Food insecurity:    Worry: Not on file    Inability: Not on file  . Transportation needs:    Medical: Not on file    Non-medical: Not on file  Tobacco Use  . Smoking status: Former Smoker    Packs/day: 1.00    Years: 10.00    Pack years: 10.00    Types: Cigarettes    Last attempt to quit: 01/12/1983    Years since quitting: 35.0  . Smokeless tobacco: Never Used  Substance and Sexual Activity  . Alcohol use: No  . Drug use: No  . Sexual activity: Not on file  Lifestyle  . Physical activity:    Days per week: Not on file    Minutes per session: Not on file  . Stress: Not on file  Relationships  . Social connections:    Talks on phone: Not on file    Gets together: Not on file    Attends religious service: Not on file    Active member of club or organization: Not on file    Attends meetings of clubs or organizations: Not on file    Relationship status: Not on file  Other Topics Concern  . Not on file  Social History Narrative   Recently unemployed from the health department, and she is actively looking for work. She lives with husband( monogamous sexual partner) and 2 grandsons. She has 2 adult children. Patient is receiving unemployment at the moment; her husband is unemployed.     Family History: The patient's family history includes Cancer in her mother and sister; Diabetes in her father, mother, and son; Heart attack in her father and mother; Heart disease in her father, mother, and son; Hyperlipidemia in her father and mother; Hypertension in her father, maternal grandmother, mother, paternal  grandmother, and son. There is no history of Colon cancer or Anesthesia problems.  ROS:   Please see the history of present illness.    Review of Systems  Psychiatric/Behavioral: Positive for depression.    All other systems reviewed and negative.   EKGs/Labs/Other Studies Reviewed:    The following studies were reviewed  today: none  EKG:  EKG is  ordered today.  The ekg ordered today demonstrates NSR with no ST changes  Recent Labs: No results found for requested labs within last 8760 hours.   Recent Lipid Panel    Component Value Date/Time   CHOL 155 12/12/2016 0735   TRIG 99 12/12/2016 0735   HDL 49 12/12/2016 0735   CHOLHDL 3.2 12/12/2016 0735   CHOLHDL 3.9 12/11/2014 0401   VLDL 22 12/11/2014 0401   LDLCALC 86 12/12/2016 0735    Physical Exam:    VS:  BP (!) 142/80   Pulse 90   Ht 5\' 5"  (1.651 m)   Wt 191 lb (86.6 kg)   BMI 31.78 kg/m     Wt Readings from Last 3 Encounters:  01/22/18 191 lb (86.6 kg)  12/08/16 187 lb 1.9 oz (84.9 kg)  09/29/15 173 lb (78.5 kg)     GEN:  Well nourished, well developed in no acute distress HEENT: Normal NECK: No JVD; No carotid bruits LYMPHATICS: No lymphadenopathy CARDIAC: RRR, no murmurs, rubs, gallops RESPIRATORY:  Clear to auscultation without rales, wheezing or rhonchi  ABDOMEN: Soft, non-tender, non-distended MUSCULOSKELETAL:  No edema; No deformity  SKIN: Warm and dry NEUROLOGIC:  Alert and oriented x 3 PSYCHIATRIC:  Normal affect   ASSESSMENT:    1. Essential hypertension   2. ESRD (end stage renal disease) ([place])   3. Bilateral carotid artery stenosis   4. Pure hypercholesterolemia   5. Chronic diastolic CHF (congestive heart failure) ([facility])   6. Chest pain, unspecified type    PLAN:    In order of problems listed above:  1.  HTN - BP is well controlled on exam today.  He will continue on Procardia XL 90mg  daily, Labetolol 400mg  TID.   2.  ESRD s/p renal Tx - followed by renal.  3.  Bilateral  carotid artery stenosis - dopplers 09/2016 showed 1-39% stenosis.  She will continue on ASA 81mg  daily and statin.   4.  Hyperlipidemia - LDL goal is < 70.  She will continue on atorvastatin 40mg  daily.  I will get an FLP and ALT.Marland Kitchen  5.  Chronic diastolic CHF - she appears euvolemic on exam today and weight is stable.  She has not required diuretics.  6.  Atypical CP that was likely related to her brothers death.  The pain was sharp and stabbing and resolved spontaneously.  She has not had any further episodes. I asked her to let me know if it reoccurred but for now no further workup.   Medication Adjustments/Labs and Tests Ordered: Current medicines are reviewed at length with the patient today.  Concerns regarding medicines are outlined above.  Orders Placed This Encounter  Procedures  . EKG 12-Lead   No orders of the defined types were placed in this encounter.   Signed, Fransico Him, MD  01/22/2018 8:39 AM    [place]

## 2018-01-22 ENCOUNTER — Ambulatory Visit (INDEPENDENT_AMBULATORY_CARE_PROVIDER_SITE_OTHER): Payer: Medicare Other | Admitting: Cardiology

## 2018-01-22 ENCOUNTER — Encounter: Payer: Self-pay | Admitting: Cardiology

## 2018-01-22 VITALS — BP 142/80 | HR 90 | Ht 65.0 in | Wt 191.0 lb

## 2018-01-22 DIAGNOSIS — I10 Essential (primary) hypertension: Secondary | ICD-10-CM

## 2018-01-22 DIAGNOSIS — I6523 Occlusion and stenosis of bilateral carotid arteries: Secondary | ICD-10-CM | POA: Diagnosis not present

## 2018-01-22 DIAGNOSIS — E7800 Pure hypercholesterolemia, unspecified: Secondary | ICD-10-CM

## 2018-01-22 DIAGNOSIS — I5032 Chronic diastolic (congestive) heart failure: Secondary | ICD-10-CM | POA: Diagnosis not present

## 2018-01-22 DIAGNOSIS — N186 End stage renal disease: Secondary | ICD-10-CM

## 2018-01-22 DIAGNOSIS — R079 Chest pain, unspecified: Secondary | ICD-10-CM

## 2018-01-22 LAB — LIPID PANEL
Chol/HDL Ratio: 2.4 ratio (ref 0.0–4.4)
Cholesterol, Total: 143 mg/dL (ref 100–199)
HDL: 59 mg/dL (ref >39)
LDL Calculated: 65 mg/dL (ref 0–99)
TRIGLYCERIDES: 93 mg/dL (ref 0–149)
VLDL Cholesterol Cal: 19 mg/dL (ref 5–40)

## 2018-01-22 LAB — HEPATIC FUNCTION PANEL
ALK PHOS: 177 IU/L — AB (ref 39–117)
ALT: 33 IU/L — AB (ref 0–32)
AST: 21 IU/L (ref 0–40)
Albumin: 4.3 g/dL (ref 3.6–4.8)
Bilirubin Total: 0.3 mg/dL (ref 0.0–1.2)
Bilirubin, Direct: 0.13 mg/dL (ref 0.00–0.40)
Total Protein: 6.4 g/dL (ref 6.0–8.5)

## 2018-01-22 NOTE — Patient Instructions (Signed)
Medication Instructions:  Your physician recommends that you continue on your current medications as directed. Please refer to the Current Medication list given to you today.  If you need a refill on your cardiac medications, please contact your pharmacy first.  Labwork: Today for liver function test and cholesterol   Testing/Procedures: None ordered   Follow-Up: Your physician wants you to follow-up in: 1 year with Dr. Radford Pax. You will receive a reminder letter in the mail two months in advance. If you don't receive a letter, please call our office to schedule the follow-up appointment.  Any Other Special Instructions Will Be Listed Below (If Applicable).   Thank you for choosing [facility], RN  [phone]  If you need a refill on your cardiac medications before your next appointment, please call your pharmacy.

## 2018-01-24 ENCOUNTER — Other Ambulatory Visit: Payer: Self-pay | Admitting: Internal Medicine

## 2018-01-24 DIAGNOSIS — Z1231 Encounter for screening mammogram for malignant neoplasm of breast: Secondary | ICD-10-CM

## 2018-01-26 ENCOUNTER — Ambulatory Visit
Admission: RE | Admit: 2018-01-26 | Discharge: 2018-01-26 | Disposition: A | Discharge status: Home / Self Care | Payer: Medicare Other | Source: Ambulatory Visit | Attending: Internal Medicine | Admitting: Internal Medicine

## 2018-01-26 DIAGNOSIS — Z1231 Encounter for screening mammogram for malignant neoplasm of breast: Secondary | ICD-10-CM

## 2019-02-26 ENCOUNTER — Telehealth: Payer: Self-pay | Admitting: Cardiology

## 2019-02-26 NOTE — Telephone Encounter (Signed)
Pt. Does not have a smart phone. Pt. Has been having chest pains. She has an apt. With her primary on 5/14 so she was unable to schedule with [place] for that day.     Virtual Visit Pre-Appointment Phone Call  "(Name), I am calling you today to discuss your upcoming appointment. We are currently trying to limit exposure to the virus that causes COVID-19 by seeing patients at home rather than in the office."  1. "What is the BEST phone number to call the day of the visit?" - include this in appointment notes  2. Do you have or have access to (through a family member/friend) a smartphone with video capability that we can use for your visit?" a. If yes - list this number in appt notes as cell (if different from BEST phone #) and list the appointment type as a VIDEO visit in appointment notes b. If no - list the appointment type as a PHONE visit in appointment notes  3. Confirm consent - "In the setting of the current Covid19 crisis, you are scheduled for a (phone or video) visit with your provider on (date) at (time).  Just as we do with many in-office visits, in order for you to participate in this visit, we must obtain consent.  If you'd like, I can send this to your mychart (if signed up) or email for you to review.  Otherwise, I can obtain your verbal consent now.  All virtual visits are billed to your insurance company just like a normal visit would be.  By agreeing to a virtual visit, we'd like you to understand that the technology does not allow for your provider to perform an examination, and thus may limit your provider's ability to fully assess your condition. If your provider identifies any concerns that need to be evaluated in person, we will make arrangements to do so.  Finally, though the technology is pretty good, we cannot assure that it will always work on either your or our end, and in the setting of a video visit, we may have to convert it to a phone-only visit.  In either situation, we  cannot ensure that we have a secure connection.  Are you willing to proceed?"  YES  4. Advise patient to be prepared - "Two hours prior to your appointment, go ahead and check your blood pressure, pulse, oxygen saturation, and your weight (if you have the equipment to check those) and write them all down. When your visit starts, your provider will ask you for this information. If you have an Apple Watch or Kardia device, please plan to have heart rate information ready on the day of your appointment. Please have a pen and paper handy nearby the day of the visit as well."  5. Give patient instructions for MyChart download to smartphone OR Doximity/Doxy.me as below if video visit (depending on what platform provider is using)  6. Inform patient they will receive a phone call 15 minutes prior to their appointment time (may be from unknown caller ID) so they should be prepared to answer    TELEPHONE CALL NOTE  [name] has been deemed a candidate for a follow-up tele-health visit to limit community exposure during the Covid-19 pandemic. I spoke with the patient via phone to ensure availability of phone/video source, confirm preferred email & phone number, and discuss instructions and expectations.  I reminded [name] to be prepared with any vital sign and/or heart rhythm information that could potentially  be obtained via home monitoring, at the time of her visit. I reminded [name] to expect a phone call prior to her visit.  [name] 02/26/2019 4:17 PM   INSTRUCTIONS FOR DOWNLOADING THE MYCHART APP TO SMARTPHONE  - The patient must first make sure to have activated MyChart and know their login information - If Apple, go to [employer] and type in MyChart in the search bar and download the app. If Android, ask patient to go to [employer] and type in [place] in the search bar and download the app. The app is free but as with any other app downloads, their phone may require  them to verify saved payment information or Apple/Android password.  - The patient will need to then log into the app with their MyChart username and password, and select [facility] as their healthcare provider to link the account. When it is time for your visit, go to the MyChart app, find appointments, and click Begin Video Visit. Be sure to Select Allow for your device to access the Microphone and Camera for your visit. You will then be connected, and your provider will be with you shortly.  **If they have any issues connecting, or need assistance please contact MyChart service desk [phone])**  **If using a computer, in order to ensure the best quality for their visit they will need to use either of the following Internet Browsers: [employer], or Google Chrome**  IF USING DOXIMITY or DOXY.ME - The patient will receive a link just prior to their visit by text.     FULL LENGTH CONSENT FOR TELE-HEALTH VISIT   I hereby voluntarily request, consent and authorize [facility] and its employed or contracted physicians, physician assistants, nurse practitioners or other licensed health care professionals (the Practitioner), to provide me with telemedicine health care services (the Services") as deemed necessary by the treating Practitioner. I acknowledge and consent to receive the Services by the Practitioner via telemedicine. I understand that the telemedicine visit will involve communicating with the Practitioner through live audiovisual communication technology and the disclosure of certain medical information by electronic transmission. I acknowledge that I have been given the opportunity to request an in-person assessment or other available alternative prior to the telemedicine visit and am voluntarily participating in the telemedicine visit.  I understand that I have the right to withhold or withdraw my consent to the use of telemedicine in the course of my care at any  time, without affecting my right to future care or treatment, and that the Practitioner or I may terminate the telemedicine visit at any time. I understand that I have the right to inspect all information obtained and/or recorded in the course of the telemedicine visit and may receive copies of available information for a reasonable fee.  I understand that some of the potential risks of receiving the Services via telemedicine include:   Delay or interruption in medical evaluation due to technological equipment failure or disruption;  Information transmitted may not be sufficient (e.g. poor resolution of images) to allow for appropriate medical decision making by the Practitioner; and/or   In rare instances, security protocols could fail, causing a breach of personal health information.  Furthermore, I acknowledge that it is my responsibility to provide information about my medical history, conditions and care that is complete and accurate to the best of my ability. I acknowledge that Practitioner's advice, recommendations, and/or decision may be based on factors not within their control, such  as incomplete or inaccurate data provided by me or distortions of diagnostic images or specimens that may result from electronic transmissions. I understand that the practice of medicine is not an exact science and that Practitioner makes no warranties or guarantees regarding treatment outcomes. I acknowledge that I will receive a copy of this consent concurrently upon execution via email to the email address I last provided but may also request a printed copy by calling the office of [facility].    I understand that my insurance will be billed for this visit.   I have read or had this consent read to me.  I understand the contents of this consent, which adequately explains the benefits and risks of the Services being provided via telemedicine.   I have been provided ample opportunity to ask questions  regarding this consent and the Services and have had my questions answered to my satisfaction.  I give my informed consent for the services to be provided through the use of telemedicine in my medical care  By participating in this telemedicine visit I agree to the above.

## 2019-03-07 ENCOUNTER — Telehealth: Payer: Medicare Other | Admitting: Cardiology

## 2019-03-07 ENCOUNTER — Telehealth: Payer: Self-pay | Admitting: Cardiology

## 2019-03-07 NOTE — Telephone Encounter (Signed)
Patient came in she said she was suppose to have a echo done today? There is no order or appointment please call patient

## 2019-03-08 NOTE — Telephone Encounter (Signed)
Spoke with the patient, she was having pain shooting down her back, neck and chest. She stated that it has occurred for 3 weeks. She was experiencing high stress and did not hardly sleep for multiple days. She was prescribed a mirtazapine 15 mg to help. She has not had any pain for 3 days. She stated she wanted to wait and see if the pain returns. Her stressful situation has resolved.  She has an appointment in July. I advised her to contact our office if the pain returns.

## 2019-03-16 ENCOUNTER — Emergency Department (HOSPITAL_COMMUNITY): Payer: Medicare Other

## 2019-03-16 ENCOUNTER — Other Ambulatory Visit: Payer: Self-pay

## 2019-03-16 ENCOUNTER — Emergency Department (HOSPITAL_COMMUNITY)
Admission: EM | Admit: 2019-03-16 | Discharge: 2019-03-16 | Disposition: A | Discharge status: Home / Self Care | Payer: Medicare Other | Attending: Emergency Medicine | Admitting: Emergency Medicine

## 2019-03-16 DIAGNOSIS — I5032 Chronic diastolic (congestive) heart failure: Secondary | ICD-10-CM | POA: Insufficient documentation

## 2019-03-16 DIAGNOSIS — Z794 Long term (current) use of insulin: Secondary | ICD-10-CM | POA: Insufficient documentation

## 2019-03-16 DIAGNOSIS — I132 Hypertensive heart and chronic kidney disease with heart failure and with stage 5 chronic kidney disease, or end stage renal disease: Secondary | ICD-10-CM | POA: Insufficient documentation

## 2019-03-16 DIAGNOSIS — R51 Headache: Secondary | ICD-10-CM

## 2019-03-16 DIAGNOSIS — Z79899 Other long term (current) drug therapy: Secondary | ICD-10-CM | POA: Diagnosis not present

## 2019-03-16 DIAGNOSIS — Z7982 Long term (current) use of aspirin: Secondary | ICD-10-CM | POA: Insufficient documentation

## 2019-03-16 DIAGNOSIS — R479 Unspecified speech disturbances: Secondary | ICD-10-CM | POA: Insufficient documentation

## 2019-03-16 DIAGNOSIS — N186 End stage renal disease: Secondary | ICD-10-CM | POA: Insufficient documentation

## 2019-03-16 DIAGNOSIS — E103299 Type 1 diabetes mellitus with mild nonproliferative diabetic retinopathy without macular edema, unspecified eye: Secondary | ICD-10-CM | POA: Diagnosis not present

## 2019-03-16 DIAGNOSIS — Z940 Kidney transplant status: Secondary | ICD-10-CM | POA: Diagnosis not present

## 2019-03-16 DIAGNOSIS — E1122 Type 2 diabetes mellitus with diabetic chronic kidney disease: Secondary | ICD-10-CM | POA: Diagnosis not present

## 2019-03-16 DIAGNOSIS — Z87891 Personal history of nicotine dependence: Secondary | ICD-10-CM | POA: Diagnosis not present

## 2019-03-16 DIAGNOSIS — R519 Headache, unspecified: Secondary | ICD-10-CM

## 2019-03-16 LAB — COMPREHENSIVE METABOLIC PANEL
ALT: 58 U/L — ABNORMAL HIGH (ref 0–44)
AST: 33 U/L (ref 15–41)
Albumin: 3.4 g/dL — ABNORMAL LOW (ref 3.5–5.0)
Alkaline Phosphatase: 137 U/L — ABNORMAL HIGH (ref 38–126)
Anion gap: 11 (ref 5–15)
BUN: 19 mg/dL (ref 8–23)
CO2: 21 mmol/L — ABNORMAL LOW (ref 22–32)
Calcium: 9.4 mg/dL (ref 8.9–10.3)
Chloride: 107 mmol/L (ref 98–111)
Creatinine, Ser: 1.62 mg/dL — ABNORMAL HIGH (ref 0.44–1.00)
GFR calc Af Amer: 38 mL/min — ABNORMAL LOW (ref >60)
GFR calc non Af Amer: 33 mL/min — ABNORMAL LOW (ref >60)
Glucose, Bld: 148 mg/dL — ABNORMAL HIGH (ref 70–99)
Potassium: 3.8 mmol/L (ref 3.5–5.1)
Sodium: 139 mmol/L (ref 135–145)
Total Bilirubin: 0.6 mg/dL (ref 0.3–1.2)
Total Protein: 6.3 g/dL — ABNORMAL LOW (ref 6.5–8.1)

## 2019-03-16 LAB — I-STAT CHEM 8, ED
BUN: 22 mg/dL (ref 8–23)
Calcium, Ion: 1.18 mmol/L (ref 1.15–1.40)
Chloride: 106 mmol/L (ref 98–111)
Creatinine, Ser: 1.6 mg/dL — ABNORMAL HIGH (ref 0.44–1.00)
Glucose, Bld: 145 mg/dL — ABNORMAL HIGH (ref 70–99)
HCT: 35 % — ABNORMAL LOW (ref 36.0–46.0)
Hemoglobin: 11.9 g/dL — ABNORMAL LOW (ref 12.0–15.0)
Potassium: 3.8 mmol/L (ref 3.5–5.1)
Sodium: 139 mmol/L (ref 135–145)
TCO2: 23 mmol/L (ref 22–32)

## 2019-03-16 LAB — CBC
HCT: 35.8 % — ABNORMAL LOW (ref 36.0–46.0)
Hemoglobin: 10.9 g/dL — ABNORMAL LOW (ref 12.0–15.0)
MCH: 27.1 pg (ref 26.0–34.0)
MCHC: 30.4 g/dL (ref 30.0–36.0)
MCV: 89.1 fL (ref 80.0–100.0)
Platelets: 256 10*3/uL (ref 150–400)
RBC: 4.02 MIL/uL (ref 3.87–5.11)
RDW: 13.6 % (ref 11.5–15.5)
WBC: 4.7 10*3/uL (ref 4.0–10.5)
nRBC: 0 % (ref 0.0–0.2)

## 2019-03-16 LAB — DIFFERENTIAL
Abs Immature Granulocytes: 0.03 10*3/uL (ref 0.00–0.07)
Basophils Absolute: 0 10*3/uL (ref 0.0–0.1)
Basophils Relative: 0 %
Eosinophils Absolute: 0.1 10*3/uL (ref 0.0–0.5)
Eosinophils Relative: 3 %
Immature Granulocytes: 1 %
Lymphocytes Relative: 6 %
Lymphs Abs: 0.3 10*3/uL — ABNORMAL LOW (ref 0.7–4.0)
Monocytes Absolute: 0.7 10*3/uL (ref 0.1–1.0)
Monocytes Relative: 14 %
Neutro Abs: 3.6 10*3/uL (ref 1.7–7.7)
Neutrophils Relative %: 76 %

## 2019-03-16 LAB — PROTIME-INR
INR: 1 (ref 0.8–1.2)
Prothrombin Time: 13 seconds (ref 11.4–15.2)

## 2019-03-16 LAB — APTT: aPTT: 28 seconds (ref 24–36)

## 2019-03-16 MED ORDER — SODIUM CHLORIDE 0.9% FLUSH
3.0000 mL | Freq: Once | INTRAVENOUS | Status: AC
  Administered 2019-03-16: 3 mL via INTRAVENOUS

## 2019-03-16 NOTE — ED Provider Notes (Addendum)
[facility] EMERGENCY DEPARTMENT Provider Note   CSN: [id number] Arrival date & time: 03/16/19  1458    History   Chief Complaint Chief Complaint  Patient presents with   Stroke Symptoms    HPI [name] is a 63 y.o. female with a past medical history of diabetes, carotid artery stenosis, CHF, diabetic retinopathy, history of end-stage renal disease no longer on dialysis after kidney transplant, History of TIAs, history of MI who presents emergency department with with chief complaint of headache and speech changes.  Patient states that around 2 PM she was in her car when she had sudden onset of a posterior headache which she describes as sharp.  She denies severe pain.  She had onset of dysarthric speech and states that her right side went weak and she was unable to move it.  That lasted approximately 5 minutes and has resolved.  Patient continues to have difficulty with her speech.  She denies other symptoms     HPI  Past Medical History:  Diagnosis Date   Anemia    2/2 renal failure   Arthritis    ? Hands   Bleeding    History of retinal bleed on full dose aspirin   Carotid artery stenosis    1-39% bilateral stenosis by dopplers 09/2016   Chronic diastolic CHF (congestive heart failure) ([place])    a. 11/2014 Echo: EF 55-60%, Gr 2 DD, Ao sclerosis, triv MR.   Depression    Diabetes mellitus    diagnosed in 1988, on insulin.   Diabetic retinopathy    s/p PRP OS   ESRD on hemodialysis ([place])    s/p kidney transplant 2016   History of TIAs    10/08- twisted face and slurred speech without extremity weakness   History of UTI    Hyperlipidemia    Hyperparathyroidism ([place])    Hypertension    Myocardial infarction ([place])    a. 2003 reportedly nl cath; b. 2011 neg MV; c. 07/2014 Dob Echo: nonischemic.   Obstructive sleep apnea    not on CPAP   Pneumonia    hx   Thyroid disease     Patient Active Problem List   Diagnosis Date Noted     Carotid artery stenosis 12/08/2016   Bilateral carotid bruits 09/29/2015   Chronic diastolic CHF (congestive heart failure) ([place])    Hyperlipidemia    Stroke-like symptoms    Syncope 12/10/2014   CVA (cerebral vascular accident) ([place]) 12/10/2014   Chest pain 09/01/2014   Encounter for adequacy testing for hemodialysis ([place]) 05/16/2013   OSA (obstructive sleep apnea) 34/12/7094   Other complications due to renal dialysis device, implant, and graft 07/19/2012   End stage renal disease ([place]) 11/24/2011   Diabetes mellitus ([place]) 05/27/2010   Hyperlipemia 05/27/2010   Anemia of chronic renal failure 05/27/2010   HTN (hypertension) 05/27/2010   CONSTIPATION, CHRONIC 05/27/2010   ESRD (end stage renal disease) ([place]) 05/27/2010   LEG PAIN, RIGHT 05/27/2010   FLANK PAIN 05/27/2010    Past Surgical History:  Procedure Laterality Date   ABDOMINAL HYSTERECTOMY     AV FISTULA PLACEMENT  12/05/2011   Procedure: ARTERIOVENOUS (AV) FISTULA CREATION;  Surgeon: Elam Dutch, MD;  Location: [facility];  Service: Vascular;  Laterality: Right;  Creation of Arteriovenous fistula right lower arm    CHOLECYSTECTOMY     EYE SURGERY     Laser DM    HERNIA REPAIR     KIDNEY TRANSPLANT  REVISON OF ARTERIOVENOUS FISTULA Right 04/17/2013   Procedure: REVISON OF ARTERIOVENOUS FISTULA and superficialization;  Surgeon: Elam Dutch, MD;  Location: [facility] OR;  Service: Vascular;  Laterality: Right;   Umbical Hernia       OB History   No obstetric history on file.      Home Medications    Prior to Admission medications   Medication Sig Start Date End Date Taking? Authorizing Provider  aspirin EC 81 MG tablet Take 1 tablet (81 mg total) by mouth daily. 12/13/14   Oswald Hillock, MD  atorvastatin (LIPITOR) 40 MG tablet Take 1 tablet by mouth daily at 6 p.m. Do not take within two hours of taking sodium bicarbonate. Please keep upcoming appt. Thanks 12/05/17   Sueanne Margarita, MD   belatacept (NULOJIX) 250 MG SOLR injection Inject 400 mg into the vein every 30 (thirty) days.    [provider]  insulin lispro (HUMALOG) 100 UNIT/ML injection Inject 8 Units into the skin 3 (three) times daily before meals.     [provider]  labetalol (NORMODYNE) 200 MG tablet Take 400 mg by mouth 3 (three) times daily.    [provider]  mycophenolate (MYFORTIC) 180 MG EC tablet Take 360 mg by mouth 2 (two) times daily.    [provider]  NIFEdipine (PROCARDIA XL/ADALAT-CC) 90 MG 24 hr tablet Take 1 tablet by mouth daily. 09/14/15   [provider]  omeprazole (PRILOSEC) 20 MG capsule Take 20 mg by mouth daily.    [provider]  predniSONE (DELTASONE) 5 MG tablet Take 15 mg by mouth daily with breakfast.    [provider]  sodium bicarbonate 650 MG tablet Take 2 tablets by mouth 3 (three) times daily.  09/02/15   [provider]  sulfamethoxazole-trimethoprim (BACTRIM,SEPTRA) 400-80 MG tablet Take 1 tablet by mouth 3 (three) times a week. 09/15/15   [provider]  valGANciclovir (VALCYTE) 450 MG tablet Take 450 mg by mouth every Monday, Wednesday, and Friday.    [provider]    Family History Family History  Problem Relation Age of Onset   Cancer Mother        lung smoker   Diabetes Mother    Hypertension Mother    Heart disease Mother        CHF, died at 28   Hyperlipidemia Mother    Heart attack Mother    Diabetes Father    Hypertension Father    Heart disease Father        CHF, died at 7   Hyperlipidemia Father    Heart attack Father    Cancer Sister        breast cancer   Diabetes Son    Heart disease Son    Hypertension Son    Hypertension Maternal Grandmother    Hypertension Paternal Grandmother    Colon cancer Neg Hx    Anesthesia problems Neg Hx     Social History Social History   Tobacco Use   Smoking status: Former Smoker     Packs/day: 1.00    Years: 10.00    Pack years: 10.00    Types: Cigarettes    Last attempt to quit: 01/12/1983    Years since quitting: 36.1   Smokeless tobacco: Never Used  Substance Use Topics   Alcohol use: No   Drug use: No     Allergies   Codeine; Hydrocodone; Hydrocodone-acetaminophen; and Clonidine derivatives   Review of Systems Review  of Systems  Ten systems reviewed and are negative for acute change, except as noted in the HPI.   Physical Exam Updated Vital Signs BP (!) 167/77    Pulse 86    Temp 98.2 F (36.8 C) (Oral)    Resp 17    SpO2 100%   Physical Exam Vitals signs and nursing note reviewed.  Constitutional:      General: She is not in acute distress.    Appearance: She is well-developed. She is not diaphoretic.  HENT:     Head: Normocephalic and atraumatic.  Eyes:     General: No scleral icterus.    Conjunctiva/sclera: Conjunctivae normal.  Neck:     Musculoskeletal: Normal range of motion.  Cardiovascular:     Rate and Rhythm: Normal rate and regular rhythm.     Heart sounds: Normal heart sounds. No murmur. No friction rub. No gallop.   Pulmonary:     Effort: Pulmonary effort is normal. No respiratory distress.     Breath sounds: Normal breath sounds.  Abdominal:     General: Bowel sounds are normal. There is no distension.     Palpations: Abdomen is soft. There is no mass.     Tenderness: There is no abdominal tenderness. There is no guarding.  Skin:    General: Skin is warm and dry.  Neurological:     Mental Status: She is alert and oriented to person, place, and time.     Comments: Speech dysarthric but goal oriented, follows commands Major Cranial nerves without deficit, no facial droop Normal strength in upper and lower extremities bilaterally including dorsiflexion and plantar flexion, strong and equal grip strength Sensation normal to light and sharp touch except in the legs secondary to chronic peripheral neuropathy Moves  extremities without ataxia, coordination intact Normal finger to nose and rapid alternating movements Neg romberg, no pronator drift    Psychiatric:        Behavior: Behavior normal.      ED Treatments / Results  Labs (all labs ordered are listed, but only abnormal results are displayed) Labs Reviewed  CBC - Abnormal; Notable for the following components:      Result Value   Hemoglobin 10.9 (*)    HCT 35.8 (*)    All other components within normal limits  DIFFERENTIAL - Abnormal; Notable for the following components:   Lymphs Abs 0.3 (*)    All other components within normal limits  COMPREHENSIVE METABOLIC PANEL - Abnormal; Notable for the following components:   CO2 21 (*)    Glucose, Bld 148 (*)    Creatinine, Ser 1.62 (*)    Total Protein 6.3 (*)    Albumin 3.4 (*)    ALT 58 (*)    Alkaline Phosphatase 137 (*)    GFR calc non Af Amer 33 (*)    GFR calc Af Amer 38 (*)    All other components within normal limits  I-STAT CHEM 8, ED - Abnormal; Notable for the following components:   Creatinine, Ser 1.60 (*)    Glucose, Bld 145 (*)    Hemoglobin 11.9 (*)    HCT 35.0 (*)    All other components within normal limits  PROTIME-INR  APTT    EKG EKG Interpretation  Date/Time:  Saturday Mar 16 2019 15:40:20 EDT Ventricular Rate:  89 PR Interval:    QRS Duration: 87 QT Interval:  374 QTC Calculation: 456 R Axis:   35 Text Interpretation:  Sinus rhythm Low voltage,  precordial leads Abnormal R-wave progression, early transition No significant change since last tracing Confirmed by Deno Etienne [phone]) on 03/16/2019 6:18:24 PM   Radiology Mr Jodene Nam Head Wo Contrast  Result Date: 03/16/2019 CLINICAL DATA:  Headache and dysarthria. EXAM: MRI HEAD WITHOUT CONTRAST MRA HEAD WITHOUT CONTRAST MRA NECK WITHOUT CONTRAST TECHNIQUE: Multiplanar, multiecho pulse sequences of the brain and surrounding structures were obtained without intravenous contrast. Angiographic images of the  Circle of Willis were obtained using MRA technique without intravenous contrast. Angiographic images of the neck were obtained using MRA technique without intravenous contrast. Carotid stenosis measurements (when applicable) are obtained utilizing NASCET criteria, using the distal internal carotid diameter as the denominator. COMPARISON:  Head CT from earlier today.  Brain MRI 12/10/2014 FINDINGS: MRI HEAD FINDINGS Brain: No acute infarction, hemorrhage, hydrocephalus, extra-axial collection or mass lesion. Mild chronic small vessel ischemic change in the cerebral white matter. Mild cerebral volume loss. Vascular: Major flow voids are preserved Skull and upper cervical spine: Negative for marrow lesion Sinuses/Orbits: Bilateral cataract resection. Mucosal thickening in the inferior maxillary sinuses. Partial right mastoid opacification with negative nasopharynx. MRA HEAD FINDINGS Vessels are smooth and widely patent. Negative for aneurysm. Unremarkable intracranial anatomy. MRA NECK FINDINGS Three vessel arch. Defect at the bilateral carotid bulb attributed to atheromatous plaque. No flow limiting stenosis or evidence of ulceration. Proximal subclavian arteries are widely patent. Artifact versus mild/moderate narrowing at the slightly non dominant right vertebral origin. IMPRESSION: Brain MRI: 1. Negative for infarct or other acute finding. 2. Mild chronic small vessel ischemia. 3. Partial right mastoid opacification with negative nasopharynx. Intracranial MRA: Negative. Neck MRA: 1. Cervical carotid atherosclerosis without flow limiting stenosis. 2. There is mild or moderate narrowing at the right vertebral origin. Electronically Signed   By: Monte Fantasia M.D.   On: 03/16/2019 18:17   Mr Jodene Nam Neck Wo Contrast  Result Date: 03/16/2019 CLINICAL DATA:  Headache and dysarthria. EXAM: MRI HEAD WITHOUT CONTRAST MRA HEAD WITHOUT CONTRAST MRA NECK WITHOUT CONTRAST TECHNIQUE: Multiplanar, multiecho pulse sequences of  the brain and surrounding structures were obtained without intravenous contrast. Angiographic images of the Circle of Willis were obtained using MRA technique without intravenous contrast. Angiographic images of the neck were obtained using MRA technique without intravenous contrast. Carotid stenosis measurements (when applicable) are obtained utilizing NASCET criteria, using the distal internal carotid diameter as the denominator. COMPARISON:  Head CT from earlier today.  Brain MRI 12/10/2014 FINDINGS: MRI HEAD FINDINGS Brain: No acute infarction, hemorrhage, hydrocephalus, extra-axial collection or mass lesion. Mild chronic small vessel ischemic change in the cerebral white matter. Mild cerebral volume loss. Vascular: Major flow voids are preserved Skull and upper cervical spine: Negative for marrow lesion Sinuses/Orbits: Bilateral cataract resection. Mucosal thickening in the inferior maxillary sinuses. Partial right mastoid opacification with negative nasopharynx. MRA HEAD FINDINGS Vessels are smooth and widely patent. Negative for aneurysm. Unremarkable intracranial anatomy. MRA NECK FINDINGS Three vessel arch. Defect at the bilateral carotid bulb attributed to atheromatous plaque. No flow limiting stenosis or evidence of ulceration. Proximal subclavian arteries are widely patent. Artifact versus mild/moderate narrowing at the slightly non dominant right vertebral origin. IMPRESSION: Brain MRI: 1. Negative for infarct or other acute finding. 2. Mild chronic small vessel ischemia. 3. Partial right mastoid opacification with negative nasopharynx. Intracranial MRA: Negative. Neck MRA: 1. Cervical carotid atherosclerosis without flow limiting stenosis. 2. There is mild or moderate narrowing at the right vertebral origin. Electronically Signed   By: Monte Fantasia M.D.   On: 03/16/2019  18:17   Mr Brain Wo Contrast  Result Date: 03/16/2019 CLINICAL DATA:  Headache and dysarthria. EXAM: MRI HEAD WITHOUT CONTRAST  MRA HEAD WITHOUT CONTRAST MRA NECK WITHOUT CONTRAST TECHNIQUE: Multiplanar, multiecho pulse sequences of the brain and surrounding structures were obtained without intravenous contrast. Angiographic images of the Circle of Willis were obtained using MRA technique without intravenous contrast. Angiographic images of the neck were obtained using MRA technique without intravenous contrast. Carotid stenosis measurements (when applicable) are obtained utilizing NASCET criteria, using the distal internal carotid diameter as the denominator. COMPARISON:  Head CT from earlier today.  Brain MRI 12/10/2014 FINDINGS: MRI HEAD FINDINGS Brain: No acute infarction, hemorrhage, hydrocephalus, extra-axial collection or mass lesion. Mild chronic small vessel ischemic change in the cerebral white matter. Mild cerebral volume loss. Vascular: Major flow voids are preserved Skull and upper cervical spine: Negative for marrow lesion Sinuses/Orbits: Bilateral cataract resection. Mucosal thickening in the inferior maxillary sinuses. Partial right mastoid opacification with negative nasopharynx. MRA HEAD FINDINGS Vessels are smooth and widely patent. Negative for aneurysm. Unremarkable intracranial anatomy. MRA NECK FINDINGS Three vessel arch. Defect at the bilateral carotid bulb attributed to atheromatous plaque. No flow limiting stenosis or evidence of ulceration. Proximal subclavian arteries are widely patent. Artifact versus mild/moderate narrowing at the slightly non dominant right vertebral origin. IMPRESSION: Brain MRI: 1. Negative for infarct or other acute finding. 2. Mild chronic small vessel ischemia. 3. Partial right mastoid opacification with negative nasopharynx. Intracranial MRA: Negative. Neck MRA: 1. Cervical carotid atherosclerosis without flow limiting stenosis. 2. There is mild or moderate narrowing at the right vertebral origin. Electronically Signed   By: Monte Fantasia M.D.   On: 03/16/2019 18:17   Ct Head Code  Stroke Wo Contrast  Result Date: 03/16/2019 CLINICAL DATA:  Code stroke.  Slurred speech EXAM: CT HEAD WITHOUT CONTRAST TECHNIQUE: Contiguous axial images were obtained from the base of the skull through the vertex without intravenous contrast. COMPARISON:  12/10/2014 FINDINGS: Brain: No evidence of acute infarction, hemorrhage, hydrocephalus, extra-axial collection or mass lesion/mass effect. Vascular: No hyperdense vessel or unexpected calcification. Skull: Normal. Negative for fracture or focal lesion. Sinuses/Orbits: No acute finding. Other: These results were communicated to Dr. Cheral Marker at 3:22 pmon 5/30/2020by text page via the [facility] messaging system. ASPECTS [facility] Stroke Program Early CT Score) - Ganglionic level infarction (caudate, lentiform nuclei, internal capsule, insula, M1-M3 cortex): 7 - Supraganglionic infarction (M4-M6 cortex): 3 Total score (0-10 with 10 being normal): 10 IMPRESSION: No acute finding.  ASPECTS is 10. Electronically Signed   By: Monte Fantasia M.D.   On: 03/16/2019 15:23    Procedures Procedures (including critical care time)  Medications Ordered in ED Medications  sodium chloride flush (NS) 0.9 % injection 3 mL (3 mLs Intravenous Given 03/16/19 1604)     Initial Impression / Assessment and Plan / ED Course  I have reviewed the triage vital signs and the nursing notes.  Pertinent labs & imaging results that were available during my care of the patient were reviewed by me and considered in my medical decision making (see chart for details).  Clinical Course as of Mar 16 2223  Sat Mar 16, 2019  1713 Creatinine(!): 1.60 [AH]    Clinical Course User Index [AH] Margarita Mail, PA-C      Patient seen as acute code stroke.  Onset of symptoms at approximately 2 or 2:30 PM.  Seen by Dr. Cheral Marker who canceled her code stroke.  Patient will need MRI/MRA of the head and  neck.  She cannot get CT angios secondary to renal transplant status.  She denies fevers or  chills.    CC: dysarthria, weakness VS: BP (!) 167/77    Pulse 86    Temp 98.2 F (36.8 C) (Oral)    Resp 17    SpO2 100%  UJ:WJXBJYN is gathered by patient  and review of EMR. DDX:The differential diagnosis for AMS is extensive and includes, but is not limited to: drug overdose - opioids, alcohol, sedatives, antipsychotics, drug withdrawal, others; Metabolic: hypoxia, hypoglycemia, hyperglycemia, hypercalcemia, hypernatremia, hyponatremia, uremia, hepatic encephalopathy, hypothyroidism, hyperthyroidism, vitamin B12 or thiamine deficiency, carbon monoxide poisoning, Wilson's disease, Lactic acidosis, DKA/HHOS; Infectious: meningitis, encephalitis, bacteremia/sepsis, urinary tract infection, pneumonia, neurosyphilis; Structural: Space-occupying lesion, (brain tumor, subdural hematoma, hydrocephalus,); Vascular: stroke, subarachnoid hemorrhage, coronary ischemia, hypertensive encephalopathy, CNS vasculitis, thrombotic thrombocytopenic purpura, disseminated intravascular coagulation, hyperviscosity; Psychiatric: Schizophrenia, depression; Other: Seizure, hypothermia, heat stroke, ICU psychosis, dementia -"sundowning." Labs: I reviewed the labs which show hyperglycemia, elevated alk phos and alt. Chronic renal insufficiency in the setting of renal transplant.   Imaging: I personally reviewed the images (ct, mri ,mra HEAD and neck) which show(s) Shows no evidence of acute infarct or dissection. EKG:.  EKG Interpretation  Date/Time:  Saturday Mar 16 2019 15:40:20 EDT Ventricular Rate:  89 PR Interval:    QRS Duration: 87 QT Interval:  374 QTC Calculation: 456 R Axis:   35 Text Interpretation:  Sinus rhythm Low voltage, precordial leads Abnormal R-wave progression, early transition No significant change since last tracing Confirmed by Deno Etienne [phone]) on 03/16/2019 6:18:24 PM     No arrhythmia MDM: With acute dysarthria which upon my initial examination as well as Dr. Cheral Marker appeared  predominantly functional.  No other neurologic abnormalities on assessment.  The patient very likely had an acute stress reaction and admits to being under severe stress at home.  The patient's headache has completely resolved.  She is back to baseline with no dysarthria, no weakness and feels ready to go home.  Feel that this is the appropriate disposition at this time and I discussed return precautions and outpatient follow-up. Patient disposition: Discharge Patient condition: Good. The patient appears reasonably screened and/or stabilized for discharge and I doubt any other medical condition or other [facility] requiring further screening, evaluation, or treatment in the ED at this time prior to discharge. I have discussed lab and/or imaging findings with the patient and answered all questions/concerns to the best of my ability. I have discussed return precautions and OP follow up.     Final Clinical Impressions(s) / ED Diagnoses   Final diagnoses:  Speech-related complaint  Bad headache    ED Discharge Orders    None           Margarita Mail, PA-C 03/16/19 [place], [place], DO 03/16/19 2310

## 2019-03-16 NOTE — ED Notes (Signed)
[facility] called to activate code stroke 

## 2019-03-16 NOTE — ED Triage Notes (Addendum)
Patient arrived with family who report sudden onset of slurred speech at 1440 today. Patient tearful on arrival and complains also of headache

## 2019-03-16 NOTE — ED Notes (Signed)
Pt returned from MRI °

## 2019-03-16 NOTE — ED Notes (Signed)
Pt's daughter updated with pt's permission

## 2019-03-16 NOTE — Consult Note (Addendum)
NEURO HOSPITALIST CONSULT NOTE   Requestig physician: Dr. Kenton Kingfisher  Reason for Consult: Dysphasia  History obtained from:   Patient, EDP and Chart    HPI:                                                                                                                                          [name] is an 63 y.o. female who presented to the ED with sudden onset of slurred speech at 1440 today. She was tearful on initial assessment by Triage RN and also had a headache. Her speech had a possible functional component per ED staff. The patient also stated that her right side had become weak and that she had been unable to move it for 5 minutes at the time of onset of her speech difficulty, but the weakness completely resolved. Her speech deficit has persisted.   CT head was negative.    Past Medical History:  Diagnosis Date  . Anemia    2/2 renal failure  . Arthritis    ? Hands  . Bleeding    History of retinal bleed on full dose aspirin  . Carotid artery stenosis    1-39% bilateral stenosis by dopplers 09/2016  . Chronic diastolic CHF (congestive heart failure) ([place])    a. 11/2014 Echo: EF 55-60%, Gr 2 DD, Ao sclerosis, triv MR.  . Depression   . Diabetes mellitus    diagnosed in 1988, on insulin.  . Diabetic retinopathy    s/p PRP OS  . ESRD on hemodialysis [facility])    s/p kidney transplant 2016  . History of TIAs    10/08- twisted face and slurred speech without extremity weakness  . History of UTI   . Hyperlipidemia   . Hyperparathyroidism ([place])   . Hypertension   . Myocardial infarction [facility])    a. 2003 reportedly nl cath; b. 2011 neg MV; c. 07/2014 Dob Echo: nonischemic.  Marland Kitchen Obstructive sleep apnea    not on CPAP  . Pneumonia    hx  . Thyroid disease     Past Surgical History:  Procedure Laterality Date  . ABDOMINAL HYSTERECTOMY    . AV FISTULA PLACEMENT  12/05/2011   Procedure: ARTERIOVENOUS (AV) FISTULA CREATION;  Surgeon: Elam Dutch, MD;   Location: [place];  Service: Vascular;  Laterality: Right;  Creation of Arteriovenous fistula right lower arm   . CHOLECYSTECTOMY    . EYE SURGERY     Laser DM   . HERNIA REPAIR    . KIDNEY TRANSPLANT    . REVISON OF ARTERIOVENOUS FISTULA Right 04/17/2013   Procedure: REVISON OF ARTERIOVENOUS FISTULA and superficialization;  Surgeon: Elam Dutch, MD;  Location: [place];  Service: Vascular;  Laterality: Right;  . Umbical Hernia  Family History  Problem Relation Age of Onset  . Cancer Mother        lung smoker  . Diabetes Mother   . Hypertension Mother   . Heart disease Mother        CHF, died at 30  . Hyperlipidemia Mother   . Heart attack Mother   . Diabetes Father   . Hypertension Father   . Heart disease Father        CHF, died at 44  . Hyperlipidemia Father   . Heart attack Father   . Cancer Sister        breast cancer  . Diabetes Son   . Heart disease Son   . Hypertension Son   . Hypertension Maternal Grandmother   . Hypertension Paternal Grandmother   . Colon cancer Neg Hx   . Anesthesia problems Neg Hx               Social History:  reports that she quit smoking about 36 years ago. Her smoking use included cigarettes. She has a 10.00 pack-year smoking history. She has never used smokeless tobacco. She reports that she does not drink alcohol or use drugs.  Allergies  Allergen Reactions  . Codeine Nausea And Vomiting  . Hydrocodone Nausea And Vomiting and Other (See Comments)    hallucinations  . Hydrocodone-Acetaminophen Nausea And Vomiting    Causes hallucination as well  . Clonidine Derivatives Other (See Comments)    HALLUCINATE    MEDICATIONS:                                                                                                                     ASA Atorvastatin Belatacept Insulin Labetalol Mycophenolate Nifedipine Omeprazole Prednisone 15 mg qd Na-bicarb Septra Valcyte   ROS:                                                                                                                                        As per HPI. All other systems reviewed and are negative.    Blood pressure 131/70, resp. rate 18.   General Examination:  Physical Exam  HEENT-  [place]/AT  Lungs- Respirations unlabored Extremities- Warm and well perfused    Neurological Examination Mental Status:  Alert and oriented. Speech has a stuttering quality that waxes and wanes, improves with distraction, worsens with close attention and does not fit a pattern consistent with a lesional aphasia. In this context she has no errors of grammar or syntax, with naming, comprehension and repetition intact. The speech also has a dysarthric component that seems most likely to be due to suppressed pharyngeal movement during consonant and vowel formation, also with a quality that is most consistent with embellishment.  Cranial Nerves: II: Visual fields grossly normal. PERRL.  III,IV, VI: ptosis not present, extra-ocular motions intact bilaterally  V,VII: smile symmetric, facial light touch sensation normal bilaterally VIII: hearing intact to voice IX,X: As above XI: Symmetric XII: midline tongue extension Motor: Right : Upper extremity   5/5    Left:     Upper extremity   5/5  Lower extremity   5/5     Lower extremity   5/5 Normal tone throughout; no atrophy noted Sensory: Temp and light touch intact throughout, bilaterally Deep Tendon Reflexes: No asymmetry noted  Plantars: Right: downgoing   Left: downgoing Cerebellar: No ataxia with FNF bilaterally  Gait: Deferred   Lab Results: Basic Metabolic Panel: Recent Labs  Lab 03/16/19 1540  NA 139  K 3.8  CL 106  GLUCOSE 145*  BUN 22  CREATININE 1.60*    CBC: Recent Labs  Lab 03/16/19 1533 03/16/19 1540  WBC 4.7  --   NEUTROABS 3.6  --   HGB 10.9* 11.9*  HCT 35.8* 35.0*  MCV 89.1  --   PLT  256  --     Cardiac Enzymes: No results for input(s): CKTOTAL, CKMB, CKMBINDEX, TROPONINI in the last 168 hours.  Lipid Panel: No results for input(s): CHOL, TRIG, HDL, CHOLHDL, VLDL, LDLCALC in the last 168 hours.  Imaging: Ct Head Code Stroke Wo Contrast  Result Date: 03/16/2019 CLINICAL DATA:  Code stroke.  Slurred speech EXAM: CT HEAD WITHOUT CONTRAST TECHNIQUE: Contiguous axial images were obtained from the base of the skull through the vertex without intravenous contrast. COMPARISON:  12/10/2014 FINDINGS: Brain: No evidence of acute infarction, hemorrhage, hydrocephalus, extra-axial collection or mass lesion/mass effect. Vascular: No hyperdense vessel or unexpected calcification. Skull: Normal. Negative for fracture or focal lesion. Sinuses/Orbits: No acute finding. Other: These results were communicated to Dr. Cheral Marker at 3:22 pmon 5/30/2020by text page via the [facility] messaging system. ASPECTS [facility] Stroke Program Early CT Score) - Ganglionic level infarction (caudate, lentiform nuclei, internal capsule, insula, M1-M3 cortex): 7 - Supraganglionic infarction (M4-M6 cortex): 3 Total score (0-10 with 10 being normal): 10 IMPRESSION: No acute finding.  ASPECTS is 10. Electronically Signed   By: Monte Fantasia M.D.   On: 03/16/2019 15:23   Brain MRI: 1. Negative for infarct or other acute finding. 2. Mild chronic small vessel ischemia. 3. Partial right mastoid opacification with negative nasopharynx.  Intracranial MRA: Negative.  Neck MRA: 1. Cervical carotid atherosclerosis without flow limiting stenosis. 2. There is mild or moderate narrowing at the right vertebral Origin.   Assessment: 63 year old female with c/c of dysarthria. Quality of her speech is most consistent with a functional dysarthria.   1. No lateralized findings on exam. Speech is fluent with intact naming, comprehension and repetition.  2. Stroke risk factors: Carotid stenosis, CHF, DM, HLD, history of TIAs,  HTN, MI and OSA.  3.  Resolved  symptom of right sided weakness. Work up in the ED is negative. Given her functional dysarthria and no flow limiting stenosis of the carotid arteries, TTE can be obtained as an outpatient. Last TTE was in 2016.   Recommendations: 1. Outpatient follow up with her PCP Rogers Blocker) for TTE 2. Continue ASA and atorvastatin.   Electronically signed: Dr. Kerney Elbe 03/16/2019, 4:34 PM

## 2019-03-16 NOTE — ED Triage Notes (Signed)
Code stroke cancelled on pt, MRI to further evaluate. Suspected by neuro to be anxiety/psych related. Pt endorses multiple recent stressors including COVID 19 and the risk of being immunocompromised, her son being in a nursing home, her daughter going to [place] against her wishes, and eight people in her life dying since March, and a neighbor getting shot last night, and she is unaware if whether he survived

## 2019-03-16 NOTE — Discharge Instructions (Signed)
Get help right away if: You have chest pain or an irregular heartbeat. You have any symptoms of stroke. The acronym BEFAST is an easy way to remember the main warning signs of stroke. B - Balance problems. Signs include dizziness, sudden trouble walking, or loss of balance. E - Eye problems. This includes trouble seeing or a sudden change in vision. F - Face changes. This includes sudden weakness or numbness of the face, or the face or eyelid drooping to one side. A - Arm weakness or numbness. This happens suddenly and usually on one side of the body. S - Speech problems. This includes trouble speaking or trouble understanding speech. T - Time. Time to call 911 or seek emergency care. Do not wait to see if symptoms will go away. Make note of the time your symptoms started. Other signs of stroke may include: A sudden, severe headache with no known cause. Nausea or vomiting. Seizure.

## 2019-04-09 ENCOUNTER — Other Ambulatory Visit: Payer: Self-pay

## 2019-04-09 ENCOUNTER — Encounter (HOSPITAL_COMMUNITY): Payer: Self-pay | Admitting: Emergency Medicine

## 2019-04-09 ENCOUNTER — Emergency Department (HOSPITAL_COMMUNITY)
Admission: EM | Admit: 2019-04-09 | Discharge: 2019-04-09 | Disposition: A | Discharge status: Home / Self Care | Payer: Medicare Other | Attending: Emergency Medicine | Admitting: Emergency Medicine

## 2019-04-09 ENCOUNTER — Emergency Department (HOSPITAL_COMMUNITY): Payer: Medicare Other

## 2019-04-09 DIAGNOSIS — N186 End stage renal disease: Secondary | ICD-10-CM | POA: Insufficient documentation

## 2019-04-09 DIAGNOSIS — Z992 Dependence on renal dialysis: Secondary | ICD-10-CM | POA: Diagnosis not present

## 2019-04-09 DIAGNOSIS — Z79899 Other long term (current) drug therapy: Secondary | ICD-10-CM | POA: Insufficient documentation

## 2019-04-09 DIAGNOSIS — R55 Syncope and collapse: Secondary | ICD-10-CM | POA: Insufficient documentation

## 2019-04-09 DIAGNOSIS — E1122 Type 2 diabetes mellitus with diabetic chronic kidney disease: Secondary | ICD-10-CM | POA: Insufficient documentation

## 2019-04-09 DIAGNOSIS — Z940 Kidney transplant status: Secondary | ICD-10-CM | POA: Insufficient documentation

## 2019-04-09 DIAGNOSIS — Z7982 Long term (current) use of aspirin: Secondary | ICD-10-CM | POA: Insufficient documentation

## 2019-04-09 DIAGNOSIS — I252 Old myocardial infarction: Secondary | ICD-10-CM | POA: Insufficient documentation

## 2019-04-09 DIAGNOSIS — I5032 Chronic diastolic (congestive) heart failure: Secondary | ICD-10-CM | POA: Insufficient documentation

## 2019-04-09 DIAGNOSIS — E11319 Type 2 diabetes mellitus with unspecified diabetic retinopathy without macular edema: Secondary | ICD-10-CM | POA: Diagnosis not present

## 2019-04-09 DIAGNOSIS — Z794 Long term (current) use of insulin: Secondary | ICD-10-CM | POA: Diagnosis not present

## 2019-04-09 DIAGNOSIS — I132 Hypertensive heart and chronic kidney disease with heart failure and with stage 5 chronic kidney disease, or end stage renal disease: Secondary | ICD-10-CM | POA: Diagnosis not present

## 2019-04-09 DIAGNOSIS — Z87891 Personal history of nicotine dependence: Secondary | ICD-10-CM | POA: Diagnosis not present

## 2019-04-09 DIAGNOSIS — E213 Hyperparathyroidism, unspecified: Secondary | ICD-10-CM | POA: Diagnosis not present

## 2019-04-09 LAB — CBC WITH DIFFERENTIAL/PLATELET
Abs Immature Granulocytes: 0.02 10*3/uL (ref 0.00–0.07)
Basophils Absolute: 0 10*3/uL (ref 0.0–0.1)
Basophils Relative: 0 %
Eosinophils Absolute: 0.1 10*3/uL (ref 0.0–0.5)
Eosinophils Relative: 1 %
HCT: 38.1 % (ref 36.0–46.0)
Hemoglobin: 11.7 g/dL — ABNORMAL LOW (ref 12.0–15.0)
Immature Granulocytes: 0 %
Lymphocytes Relative: 5 %
Lymphs Abs: 0.3 10*3/uL — ABNORMAL LOW (ref 0.7–4.0)
MCH: 27 pg (ref 26.0–34.0)
MCHC: 30.7 g/dL (ref 30.0–36.0)
MCV: 87.8 fL (ref 80.0–100.0)
Monocytes Absolute: 0.5 10*3/uL (ref 0.1–1.0)
Monocytes Relative: 8 %
Neutro Abs: 5.1 10*3/uL (ref 1.7–7.7)
Neutrophils Relative %: 86 %
Platelets: 243 10*3/uL (ref 150–400)
RBC: 4.34 MIL/uL (ref 3.87–5.11)
RDW: 13.2 % (ref 11.5–15.5)
WBC: 5.9 10*3/uL (ref 4.0–10.5)
nRBC: 0 % (ref 0.0–0.2)

## 2019-04-09 LAB — CBG MONITORING, ED: Glucose-Capillary: 244 mg/dL — ABNORMAL HIGH (ref 70–99)

## 2019-04-09 LAB — COMPREHENSIVE METABOLIC PANEL
ALT: 50 U/L — ABNORMAL HIGH (ref 0–44)
AST: 20 U/L (ref 15–41)
Albumin: 3.6 g/dL (ref 3.5–5.0)
Alkaline Phosphatase: 136 U/L — ABNORMAL HIGH (ref 38–126)
Anion gap: 10 (ref 5–15)
BUN: 13 mg/dL (ref 8–23)
CO2: 22 mmol/L (ref 22–32)
Calcium: 9.5 mg/dL (ref 8.9–10.3)
Chloride: 105 mmol/L (ref 98–111)
Creatinine, Ser: 1.49 mg/dL — ABNORMAL HIGH (ref 0.44–1.00)
GFR calc Af Amer: 42 mL/min — ABNORMAL LOW (ref >60)
GFR calc non Af Amer: 36 mL/min — ABNORMAL LOW (ref >60)
Glucose, Bld: 246 mg/dL — ABNORMAL HIGH (ref 70–99)
Potassium: 4.5 mmol/L (ref 3.5–5.1)
Sodium: 137 mmol/L (ref 135–145)
Total Bilirubin: 0.8 mg/dL (ref 0.3–1.2)
Total Protein: 6.2 g/dL — ABNORMAL LOW (ref 6.5–8.1)

## 2019-04-09 NOTE — ED Triage Notes (Signed)
Pt reports was near syncopal on Saturday because of a blood sugar of 45. States today doesn't feel well, not eating, no energy since her son died 2 weeks ago. Pt tearful, grieving.

## 2019-04-09 NOTE — ED Notes (Signed)
Patient verbalizes understanding of discharge instructions. Opportunity for questioning and answers were provided. Armband removed by staff, pt discharged from ED.  

## 2019-04-09 NOTE — Discharge Instructions (Addendum)
Take the medications as directed. Return to the ED for worsening symptoms, lightheadedness, loss of consciousness, chest pain or shortness of breath.

## 2019-04-09 NOTE — ED Provider Notes (Signed)
[name] EMERGENCY DEPARTMENT Provider Note   CSN: [id number] Arrival date & time: 04/09/19  1131    History   Chief Complaint No chief complaint on file.   HPI [name] is a 63 y.o. female with a past medical history if anemia, HTN, HLD, CHF, s/p kidney transplant in 2016, prior MI, who presents to ED for near syncopal episode on 04/06/2019.  States that her blood sugar at the time was 45.  States that she did drink a beverage which improved her symptoms and her blood sugar.  She does note that she has "not been taking care of myself" since the death of her son 2 weeks ago.  She reports decreased appetite and energy.  She has been compliant with her home medications but "I'm scared to eat anything because of my blood sugar."  She denies any chest pain, abdominal pain, vomiting, diarrhea, head injuries, vision changes, fever or cough.  No sick contacts with similar symptoms.     HPI  Past Medical History:  Diagnosis Date   Anemia    2/2 renal failure   Arthritis    ? Hands   Bleeding    History of retinal bleed on full dose aspirin   Carotid artery stenosis    1-39% bilateral stenosis by dopplers 09/2016   Chronic diastolic CHF (congestive heart failure) ([place])    a. 11/2014 Echo: EF 55-60%, Gr 2 DD, Ao sclerosis, triv MR.   Depression    Diabetes mellitus    diagnosed in 1988, on insulin.   Diabetic retinopathy    s/p PRP OS   ESRD on hemodialysis ([place])    s/p kidney transplant 2016   History of TIAs    10/08- twisted face and slurred speech without extremity weakness   History of UTI    Hyperlipidemia    Hyperparathyroidism ([place])    Hypertension    Myocardial infarction ([place])    a. 2003 reportedly nl cath; b. 2011 neg MV; c. 07/2014 Dob Echo: nonischemic.   Obstructive sleep apnea    not on CPAP   Pneumonia    hx   Thyroid disease     Patient Active Problem List   Diagnosis Date Noted   Carotid artery stenosis  12/08/2016   Bilateral carotid bruits 09/29/2015   Chronic diastolic CHF (congestive heart failure) ([place])    Hyperlipidemia    Stroke-like symptoms    Syncope 12/10/2014   CVA (cerebral vascular accident) ([place]) 12/10/2014   Chest pain 09/01/2014   Encounter for adequacy testing for hemodialysis ([place]) 05/16/2013   OSA (obstructive sleep apnea) 14/48/1856   Other complications due to renal dialysis device, implant, and graft 07/19/2012   End stage renal disease ([place]) 11/24/2011   Diabetes mellitus ([place]) 05/27/2010   Hyperlipemia 05/27/2010   Anemia of chronic renal failure 05/27/2010   HTN (hypertension) 05/27/2010   CONSTIPATION, CHRONIC 05/27/2010   ESRD (end stage renal disease) ([place]) 05/27/2010   LEG PAIN, RIGHT 05/27/2010   FLANK PAIN 05/27/2010    Past Surgical History:  Procedure Laterality Date   ABDOMINAL HYSTERECTOMY     AV FISTULA PLACEMENT  12/05/2011   Procedure: ARTERIOVENOUS (AV) FISTULA CREATION;  Surgeon: Elam Dutch, MD;  Location: [place];  Service: Vascular;  Laterality: Right;  Creation of Arteriovenous fistula right lower arm    CHOLECYSTECTOMY     EYE SURGERY     Laser DM    HERNIA REPAIR     KIDNEY TRANSPLANT  REVISON OF ARTERIOVENOUS FISTULA Right 04/17/2013   Procedure: REVISON OF ARTERIOVENOUS FISTULA and superficialization;  Surgeon: Elam Dutch, MD;  Location: [facility];  Service: Vascular;  Laterality: Right;   Umbical Hernia       OB History   No obstetric history on file.      Home Medications    Prior to Admission medications   Medication Sig Start Date End Date Taking? Authorizing Provider  aspirin EC 81 MG tablet Take 1 tablet (81 mg total) by mouth daily. 12/13/14   Oswald Hillock, MD  atorvastatin (LIPITOR) 40 MG tablet Take 1 tablet by mouth daily at 6 p.m. Do not take within two hours of taking sodium bicarbonate. Please keep upcoming appt. Thanks 12/05/17   Sueanne Margarita, MD  belatacept (NULOJIX) 250  MG SOLR injection Inject 400 mg into the vein every 30 (thirty) days.    [provider]  insulin lispro (HUMALOG) 100 UNIT/ML injection Inject 8 Units into the skin 3 (three) times daily before meals.     [provider]  labetalol (NORMODYNE) 200 MG tablet Take 400 mg by mouth 3 (three) times daily.    [provider]  mycophenolate (MYFORTIC) 180 MG EC tablet Take 360 mg by mouth 2 (two) times daily.    [provider]  NIFEdipine (PROCARDIA XL/ADALAT-CC) 90 MG 24 hr tablet Take 1 tablet by mouth daily. 09/14/15   [provider]  omeprazole (PRILOSEC) 20 MG capsule Take 20 mg by mouth daily.    [provider]  predniSONE (DELTASONE) 5 MG tablet Take 15 mg by mouth daily with breakfast.    [provider]  sodium bicarbonate 650 MG tablet Take 2 tablets by mouth 3 (three) times daily.  09/02/15   [provider]  sulfamethoxazole-trimethoprim (BACTRIM,SEPTRA) 400-80 MG tablet Take 1 tablet by mouth 3 (three) times a week. 09/15/15   [provider]  valGANciclovir (VALCYTE) 450 MG tablet Take 450 mg by mouth every Monday, Wednesday, and Friday.    [provider]    Family History Family History  Problem Relation Age of Onset   Cancer Mother        lung smoker   Diabetes Mother    Hypertension Mother    Heart disease Mother        CHF, died at 51   Hyperlipidemia Mother    Heart attack Mother    Diabetes Father    Hypertension Father    Heart disease Father        CHF, died at 50   Hyperlipidemia Father    Heart attack Father    Cancer Sister        breast cancer   Diabetes Son    Heart disease Son    Hypertension Son    Hypertension Maternal Grandmother    Hypertension Paternal Grandmother    Colon cancer Neg Hx    Anesthesia problems Neg Hx     Social History Social History   Tobacco Use   Smoking status: Former Smoker    Packs/day: 1.00    Years: 10.00     Pack years: 10.00    Types: Cigarettes    Quit date: 01/12/1983    Years since quitting: 36.2   Smokeless tobacco: Never Used  Substance Use Topics   Alcohol use: No   Drug use: No     Allergies   Codeine, Hydrocodone, Hydrocodone-acetaminophen, and Clonidine derivatives   Review of Systems Review of Systems  Constitutional: Negative for appetite change, chills and fever.  HENT: Negative for ear pain, rhinorrhea, sneezing and sore throat.   Eyes: Negative for photophobia and visual disturbance.  Respiratory: Negative for cough, chest tightness, shortness of breath and wheezing.   Cardiovascular: Negative for chest pain and palpitations.  Gastrointestinal: Negative for abdominal pain, blood in stool, constipation, diarrhea, nausea and vomiting.  Genitourinary: Negative for dysuria, hematuria and urgency.  Musculoskeletal: Negative for myalgias.  Skin: Negative for rash.  Neurological: Negative for dizziness, weakness and light-headedness.       Near syncope     Physical Exam Updated Vital Signs BP (!) 180/81    Pulse 89    Temp 98.5 F (36.9 C) (Oral)    Resp 15    SpO2 100%   Physical Exam Vitals signs and nursing note reviewed.  Constitutional:      General: She is not in acute distress.    Appearance: She is well-developed.     Comments: Tearful. No acute distress.  HENT:     Head: Normocephalic and atraumatic.     Nose: Nose normal.  Eyes:     General: No scleral icterus.       Left eye: No discharge.     Conjunctiva/sclera: Conjunctivae normal.     Pupils: Pupils are equal, round, and reactive to light.  Neck:     Musculoskeletal: Normal range of motion and neck supple.  Cardiovascular:     Rate and Rhythm: Normal rate and regular rhythm.     Heart sounds: Normal heart sounds. No murmur. No friction rub. No gallop.   Pulmonary:     Effort: Pulmonary effort is normal. No respiratory distress.     Breath sounds: Normal breath sounds.  Abdominal:      General: Bowel sounds are normal. There is no distension.     Palpations: Abdomen is soft.     Tenderness: There is no abdominal tenderness. There is no guarding.  Musculoskeletal: Normal range of motion.  Skin:    General: Skin is warm and dry.     Findings: No rash.  Neurological:     General: No focal deficit present.     Mental Status: She is alert and oriented to person, place, and time.     Cranial Nerves: No cranial nerve deficit.     Sensory: No sensory deficit.     Motor: No weakness or abnormal muscle tone.     Coordination: Coordination normal.     Comments: Pupils reactive. No facial asymmetry noted. Cranial nerves appear grossly intact. Sensation intact to light touch on face, BUE and BLE. Strength 5/5 in BUE and BLE.       ED Treatments / Results  Labs (all labs ordered are listed, but only abnormal results are displayed) Labs Reviewed  COMPREHENSIVE METABOLIC PANEL - Abnormal; Notable for the following components:      Result Value   Glucose, Bld 246 (*)    Creatinine, Ser 1.49 (*)    Total Protein 6.2 (*)    ALT 50 (*)    Alkaline Phosphatase 136 (*)    GFR calc non Af Amer 36 (*)    GFR calc Af Amer 42 (*)    All other components within normal limits  CBC WITH DIFFERENTIAL/PLATELET - Abnormal; Notable for the following components:   Hemoglobin 11.7 (*)    Lymphs Abs 0.3 (*)    All other components within normal limits  CBG MONITORING, ED - Abnormal; Notable for the  following components:   Glucose-Capillary 244 (*)    All other components within normal limits    EKG EKG Interpretation  Date/Time:  Tuesday April 09 2019 14:28:22 EDT Ventricular Rate:  83 PR Interval:  132 QRS Duration: 88 QT Interval:  378 QTC Calculation: 444 R Axis:   9 Text Interpretation:   Poor data quality, interpretation may be adversely affected Normal sinus rhythm Left ventricular hypertrophy Abnormal ECG deep q wave improved with lead placement correction Confirmed by Deno Etienne [phone]) on 04/09/2019 3:12:34 PM   Radiology Dg Chest 2 View  Result Date: 04/09/2019 CLINICAL DATA:  Syncopal episode 4 days ago. EXAM: CHEST - 2 VIEW COMPARISON:  Chest x-ray dated September 01, 2014. FINDINGS: The heart size and mediastinal contours are within normal limits. Atherosclerotic calcification of the aortic arch. Normal pulmonary vascularity. No focal consolidation, pleural effusion, or pneumothorax. No acute osseous abnormality. IMPRESSION: No active cardiopulmonary disease. Electronically Signed   By: Titus Dubin M.D.   On: 04/09/2019 12:24    Procedures Procedures (including critical care time)  Medications Ordered in ED Medications - No data to display   Initial Impression / Assessment and Plan / ED Course  I have reviewed the triage vital signs and the nursing notes.  Pertinent labs & imaging results that were available during my care of the patient were reviewed by me and considered in my medical decision making (see chart for details).        63 year old female with past medical history of anemia, hypertension, hyperlipidemia, CHF, prior MI presents to ED for near syncopal episode on 04/06/2019.  States that her blood sugar at that time was 45.  This improved with oral intake.  She does admit that she has had a decreased energy since the death of her son 2 weeks ago.  She denies any specific chest pain, fever, abdominal pain or vomiting, diarrhea, head injuries.  On exam patient is overall well-appearing.  No deficits neurological exam noted.  Vital signs here are reassuring.  She is not hypoglycemic here.  EKG shows no changes from prior tracings.  Chest x-ray is unremarkable.  CBC, BMP unremarkable.  Patient able to ambulate here with no recurrence of her symptoms.  Able to tolerate p.o. intake without difficulty.  Suspect that her symptoms are due to her hypoglycemia 2 days ago.  Doubt stroke, cardiac cause of symptoms. She encouraged her to increase her p.o.  intake and continue her home medications as previously prescribed.  She denies any SI, HI.  We will have her follow-up with PCP and return for worsening symptoms.   Patient is hemodynamically stable, in NAD, and able to ambulate in the ED. Evaluation does not show pathology that would require ongoing emergent intervention or inpatient treatment. I explained the diagnosis to the patient. Pain has been managed and has no complaints prior to discharge. Patient is comfortable with above plan and is stable for discharge at this time. All questions were answered prior to disposition. Strict return precautions for returning to the ED were discussed. Encouraged follow up with PCP.   An After Visit Summary was printed and given to the patient.   Portions of this note were generated with [employer]. Dictation errors may occur despite best attempts at proofreading.  Final Clinical Impressions(s) / ED Diagnoses   Final diagnoses:  Near syncope    ED Discharge Orders    None       Delia Heady, [place] 04/09/19 1518  Deno Etienne, DO 04/09/19 1551

## 2019-04-09 NOTE — ED Notes (Signed)
Patient ambulated in hall without difficulty or assistance. Returned to bed and given water to drink.

## 2019-04-11 ENCOUNTER — Emergency Department (HOSPITAL_COMMUNITY)
Admission: EM | Admit: 2019-04-11 | Discharge: 2019-04-12 | Disposition: A | Discharge status: Home / Self Care | Payer: Medicare Other | Attending: Emergency Medicine | Admitting: Emergency Medicine

## 2019-04-11 ENCOUNTER — Other Ambulatory Visit: Payer: Self-pay

## 2019-04-11 ENCOUNTER — Emergency Department (HOSPITAL_COMMUNITY): Payer: Medicare Other

## 2019-04-11 ENCOUNTER — Encounter (HOSPITAL_COMMUNITY): Payer: Self-pay | Admitting: Emergency Medicine

## 2019-04-11 DIAGNOSIS — I132 Hypertensive heart and chronic kidney disease with heart failure and with stage 5 chronic kidney disease, or end stage renal disease: Secondary | ICD-10-CM | POA: Insufficient documentation

## 2019-04-11 DIAGNOSIS — N186 End stage renal disease: Secondary | ICD-10-CM | POA: Diagnosis not present

## 2019-04-11 DIAGNOSIS — R5383 Other fatigue: Secondary | ICD-10-CM | POA: Diagnosis not present

## 2019-04-11 DIAGNOSIS — E1122 Type 2 diabetes mellitus with diabetic chronic kidney disease: Secondary | ICD-10-CM | POA: Diagnosis not present

## 2019-04-11 DIAGNOSIS — R519 Headache, unspecified: Secondary | ICD-10-CM

## 2019-04-11 DIAGNOSIS — Z794 Long term (current) use of insulin: Secondary | ICD-10-CM | POA: Insufficient documentation

## 2019-04-11 DIAGNOSIS — I252 Old myocardial infarction: Secondary | ICD-10-CM | POA: Insufficient documentation

## 2019-04-11 DIAGNOSIS — H538 Other visual disturbances: Secondary | ICD-10-CM | POA: Diagnosis not present

## 2019-04-11 DIAGNOSIS — R51 Headache: Secondary | ICD-10-CM | POA: Insufficient documentation

## 2019-04-11 DIAGNOSIS — Z7982 Long term (current) use of aspirin: Secondary | ICD-10-CM | POA: Insufficient documentation

## 2019-04-11 DIAGNOSIS — Z940 Kidney transplant status: Secondary | ICD-10-CM | POA: Diagnosis not present

## 2019-04-11 DIAGNOSIS — Z87891 Personal history of nicotine dependence: Secondary | ICD-10-CM | POA: Insufficient documentation

## 2019-04-11 DIAGNOSIS — H539 Unspecified visual disturbance: Secondary | ICD-10-CM

## 2019-04-11 DIAGNOSIS — Z79899 Other long term (current) drug therapy: Secondary | ICD-10-CM | POA: Diagnosis not present

## 2019-04-11 DIAGNOSIS — I5032 Chronic diastolic (congestive) heart failure: Secondary | ICD-10-CM | POA: Insufficient documentation

## 2019-04-11 DIAGNOSIS — I10 Essential (primary) hypertension: Secondary | ICD-10-CM

## 2019-04-11 LAB — BASIC METABOLIC PANEL
Anion gap: 9 (ref 5–15)
BUN: 18 mg/dL (ref 8–23)
CO2: 23 mmol/L (ref 22–32)
Calcium: 9.6 mg/dL (ref 8.9–10.3)
Chloride: 107 mmol/L (ref 98–111)
Creatinine, Ser: 1.63 mg/dL — ABNORMAL HIGH (ref 0.44–1.00)
GFR calc Af Amer: 38 mL/min — ABNORMAL LOW (ref >60)
GFR calc non Af Amer: 33 mL/min — ABNORMAL LOW (ref >60)
Glucose, Bld: 150 mg/dL — ABNORMAL HIGH (ref 70–99)
Potassium: 3.7 mmol/L (ref 3.5–5.1)
Sodium: 139 mmol/L (ref 135–145)

## 2019-04-11 LAB — PROTIME-INR
INR: 0.9 (ref 0.8–1.2)
Prothrombin Time: 12.4 seconds (ref 11.4–15.2)

## 2019-04-11 LAB — APTT: aPTT: 29 seconds (ref 24–36)

## 2019-04-11 LAB — DIFFERENTIAL
Basophils Absolute: 0 10*3/uL (ref 0.0–0.1)
Basophils Relative: 0 %
Eosinophils Absolute: 0.1 10*3/uL (ref 0.0–0.5)
Eosinophils Relative: 2 %
Lymphocytes Relative: 6 %
Lymphs Abs: 0.3 10*3/uL — ABNORMAL LOW (ref 0.7–4.0)
Monocytes Absolute: 0.7 10*3/uL (ref 0.1–1.0)
Monocytes Relative: 16 %
Neutro Abs: 3.5 10*3/uL (ref 1.7–7.7)
Neutrophils Relative %: 75 %

## 2019-04-11 LAB — CBC
HCT: 37.9 % (ref 36.0–46.0)
Hemoglobin: 11.7 g/dL — ABNORMAL LOW (ref 12.0–15.0)
MCH: 27.6 pg (ref 26.0–34.0)
MCHC: 30.9 g/dL (ref 30.0–36.0)
MCV: 89.4 fL (ref 80.0–100.0)
Platelets: 235 10*3/uL (ref 150–400)
RBC: 4.24 MIL/uL (ref 3.87–5.11)
RDW: 13.2 % (ref 11.5–15.5)
WBC: 4.7 10*3/uL (ref 4.0–10.5)
nRBC: 0 % (ref 0.0–0.2)

## 2019-04-11 LAB — ETHANOL: Alcohol, Ethyl (B): <10 mg/dL (ref <10)

## 2019-04-11 LAB — CBG MONITORING, ED: Glucose-Capillary: 164 mg/dL — ABNORMAL HIGH (ref 70–99)

## 2019-04-11 MED ORDER — METOCLOPRAMIDE HCL 5 MG/ML IJ SOLN
10.0000 mg | Freq: Once | INTRAMUSCULAR | Status: AC
  Administered 2019-04-11: 10 mg via INTRAVENOUS
  Filled 2019-04-11: qty 2

## 2019-04-11 MED ORDER — SODIUM CHLORIDE 0.9% FLUSH: 3.0000 mL | Freq: Once | INTRAVENOUS | Status: DC

## 2019-04-11 NOTE — ED Notes (Signed)
Patient currently at MRI

## 2019-04-11 NOTE — ED Provider Notes (Signed)
[facility] EMERGENCY DEPARTMENT Provider Note   CSN: [id number] Arrival date & time: 04/11/19  1823     History   Chief Complaint Chief Complaint  Patient presents with   Fatigue    HPI [name] is a 63 y.o. female.  She is a very poor historian.  She said she has not felt well since yesterday with a posterior and right temporal headache and feeling very fatigued.  Sometime today when she woke up she said she was having difficulty seeing and everything was blurry and she said she was raving at her husband.  She said he called the police on her and then ultimately the ambulance came.  It is unclear what when the vision happened because she said she took a nap afterwards and her vision was back to normal.  She said she still has a headache and does not feel well but is very vague.  She said she has been very stressed since her son died a few weeks ago.     The history is provided by the patient.  Cerebrovascular Accident This is a new problem. Episode onset: unclear - sometime today. The problem has been resolved. Associated symptoms include headaches. Pertinent negatives include no chest pain, no abdominal pain and no shortness of breath. Nothing aggravates the symptoms. Nothing relieves the symptoms. She has tried nothing for the symptoms. The treatment provided no relief.    Past Medical History:  Diagnosis Date   Anemia    2/2 renal failure   Arthritis    ? Hands   Bleeding    History of retinal bleed on full dose aspirin   Carotid artery stenosis    1-39% bilateral stenosis by dopplers 09/2016   Chronic diastolic CHF (congestive heart failure) ([place])    a. 11/2014 Echo: EF 55-60%, Gr 2 DD, Ao sclerosis, triv MR.   Depression    Diabetes mellitus    diagnosed in 1988, on insulin.   Diabetic retinopathy    s/p PRP OS   ESRD on hemodialysis ([place])    s/p kidney transplant 2016   History of TIAs    10/08- twisted face and slurred speech  without extremity weakness   History of UTI    Hyperlipidemia    Hyperparathyroidism ([place])    Hypertension    Myocardial infarction ([place])    a. 2003 reportedly nl cath; b. 2011 neg MV; c. 07/2014 Dob Echo: nonischemic.   Obstructive sleep apnea    not on CPAP   Pneumonia    hx   Thyroid disease     Patient Active Problem List   Diagnosis Date Noted   Carotid artery stenosis 12/08/2016   Bilateral carotid bruits 09/29/2015   Chronic diastolic CHF (congestive heart failure) ([place])    Hyperlipidemia    Stroke-like symptoms    Syncope 12/10/2014   CVA (cerebral vascular accident) ([place]) 12/10/2014   Chest pain 09/01/2014   Encounter for adequacy testing for hemodialysis ([place]) 05/16/2013   OSA (obstructive sleep apnea) 27/03/2375   Other complications due to renal dialysis device, implant, and graft 07/19/2012   End stage renal disease ([place]) 11/24/2011   Diabetes mellitus ([place]) 05/27/2010   Hyperlipemia 05/27/2010   Anemia of chronic renal failure 05/27/2010   HTN (hypertension) 05/27/2010   CONSTIPATION, CHRONIC 05/27/2010   ESRD (end stage renal disease) ([place]) 05/27/2010   LEG PAIN, RIGHT 05/27/2010   FLANK PAIN 05/27/2010    Past Surgical History:  Procedure Laterality Date  ABDOMINAL HYSTERECTOMY     AV FISTULA PLACEMENT  12/05/2011   Procedure: ARTERIOVENOUS (AV) FISTULA CREATION;  Surgeon: Elam Dutch, MD;  Location: [facility];  Service: Vascular;  Laterality: Right;  Creation of Arteriovenous fistula right lower arm    CHOLECYSTECTOMY     EYE SURGERY     Laser DM    HERNIA REPAIR     KIDNEY TRANSPLANT     REVISON OF ARTERIOVENOUS FISTULA Right 04/17/2013   Procedure: REVISON OF ARTERIOVENOUS FISTULA and superficialization;  Surgeon: Elam Dutch, MD;  Location: [facility];  Service: Vascular;  Laterality: Right;   Umbical Hernia       OB History   No obstetric history on file.      Home Medications    Prior to Admission  medications   Medication Sig Start Date End Date Taking? Authorizing Provider  aspirin EC 81 MG tablet Take 1 tablet (81 mg total) by mouth daily. 12/13/14   Oswald Hillock, MD  atorvastatin (LIPITOR) 40 MG tablet Take 1 tablet by mouth daily at 6 p.m. Do not take within two hours of taking sodium bicarbonate. Please keep upcoming appt. Thanks 12/05/17   Sueanne Margarita, MD  belatacept (NULOJIX) 250 MG SOLR injection Inject 400 mg into the vein every 30 (thirty) days.    [provider]  insulin lispro (HUMALOG) 100 UNIT/ML injection Inject 8 Units into the skin 3 (three) times daily before meals.     [provider]  labetalol (NORMODYNE) 200 MG tablet Take 400 mg by mouth 3 (three) times daily.    [provider]  mycophenolate (MYFORTIC) 180 MG EC tablet Take 360 mg by mouth 2 (two) times daily.    [provider]  NIFEdipine (PROCARDIA XL/ADALAT-CC) 90 MG 24 hr tablet Take 1 tablet by mouth daily. 09/14/15   [provider]  omeprazole (PRILOSEC) 20 MG capsule Take 20 mg by mouth daily.    [provider]  predniSONE (DELTASONE) 5 MG tablet Take 15 mg by mouth daily with breakfast.    [provider]  sodium bicarbonate 650 MG tablet Take 2 tablets by mouth 3 (three) times daily.  09/02/15   [provider]  sulfamethoxazole-trimethoprim (BACTRIM,SEPTRA) 400-80 MG tablet Take 1 tablet by mouth 3 (three) times a week. 09/15/15   [provider]  valGANciclovir (VALCYTE) 450 MG tablet Take 450 mg by mouth every Monday, Wednesday, and Friday.    [provider]    Family History Family History  Problem Relation Age of Onset   Cancer Mother        lung smoker   Diabetes Mother    Hypertension Mother    Heart disease Mother        CHF, died at 67   Hyperlipidemia Mother    Heart attack Mother    Diabetes Father    Hypertension Father    Heart disease Father        CHF, died at 58    Hyperlipidemia Father    Heart attack Father    Cancer Sister        breast cancer   Diabetes Son    Heart disease Son    Hypertension Son    Hypertension Maternal Grandmother    Hypertension Paternal Grandmother    Colon cancer Neg Hx    Anesthesia problems Neg Hx     Social History Social History   Tobacco Use   Smoking status: Former Smoker  Packs/day: 1.00    Years: 10.00    Pack years: 10.00    Types: Cigarettes    Quit date: 01/12/1983    Years since quitting: 36.2   Smokeless tobacco: Never Used  Substance Use Topics   Alcohol use: No   Drug use: No     Allergies   Codeine, Hydrocodone, Hydrocodone-acetaminophen, and Clonidine derivatives   Review of Systems Review of Systems  Constitutional: Positive for fatigue. Negative for fever.  HENT: Negative for sore throat.   Eyes: Negative for visual disturbance.  Respiratory: Negative for shortness of breath.   Cardiovascular: Negative for chest pain.  Gastrointestinal: Negative for abdominal pain.  Genitourinary: Negative for dysuria.  Musculoskeletal: Negative for back pain.  Skin: Negative for rash.  Neurological: Positive for headaches.     Physical Exam Updated Vital Signs BP (!) 181/85 (BP Location: Left Arm)    Pulse 94    Temp 97.9 F (36.6 C) (Oral)    Resp 16    Ht 5' (1.524 m)    Wt 85 kg    SpO2 100%    BMI 36.60 kg/m   Physical Exam Vitals signs and nursing note reviewed.  Constitutional:      General: She is not in acute distress.    Appearance: She is well-developed.  HENT:     Head: Normocephalic and atraumatic.  Eyes:     Extraocular Movements: Extraocular movements intact.     Conjunctiva/sclera: Conjunctivae normal.     Pupils: Pupils are equal, round, and reactive to light.  Neck:     Musculoskeletal: Neck supple.  Cardiovascular:     Rate and Rhythm: Normal rate and regular rhythm.     Heart sounds: No murmur.  Pulmonary:     Effort: Pulmonary effort is  normal. No respiratory distress.     Breath sounds: Normal breath sounds.  Abdominal:     Palpations: Abdomen is soft.     Tenderness: There is no abdominal tenderness.  Musculoskeletal: Normal range of motion.     Right lower leg: No edema.     Left lower leg: No edema.  Skin:    General: Skin is warm and dry.     Capillary Refill: Capillary refill takes less than 2 seconds.  Neurological:     General: No focal deficit present.     Mental Status: She is alert.     Comments: Patient is awake and alert.  Her speech is fluent.  There is not any obvious facial asymmetry.  She has intact strength of her upper extremities symmetric and no pronator drift.  Her lower extremities are bilaterally weak but symmetric.  She can lift them off the bed.      ED Treatments / Results  Labs (all labs ordered are listed, but only abnormal results are displayed) Labs Reviewed  BASIC METABOLIC PANEL - Abnormal; Notable for the following components:      Result Value   Glucose, Bld 150 (*)    Creatinine, Ser 1.63 (*)    GFR calc non Af Amer 33 (*)    GFR calc Af Amer 38 (*)    All other components within normal limits  CBC - Abnormal; Notable for the following components:   Hemoglobin 11.7 (*)    All other components within normal limits  DIFFERENTIAL - Abnormal; Notable for the following components:   Lymphs Abs 0.3 (*)    All other components within normal limits  CBG MONITORING, ED - Abnormal; Notable for  the following components:   Glucose-Capillary 164 (*)    All other components within normal limits  ETHANOL  PROTIME-INR  APTT    EKG EKG Interpretation  Date/Time:  Thursday April 11 2019 18:35:41 EDT Ventricular Rate:  93 PR Interval:    QRS Duration: 76 QT Interval:  362 QTC Calculation: 451 R Axis:   14 Text Interpretation:  Sinus rhythm Consider left atrial enlargement Abnormal R-wave progression, early transition similar to prior 6/20 Confirmed by Aletta Edouard [phone]) on  04/11/2019 7:07:08 PM   Radiology Ct Head Wo Contrast  Result Date: 04/11/2019 CLINICAL DATA:  TIA. Left hand tingling and slurred speech. Ataxia. EXAM: CT HEAD WITHOUT CONTRAST TECHNIQUE: Contiguous axial images were obtained from the base of the skull through the vertex without intravenous contrast. COMPARISON:  Mar 16, 2019. FINDINGS: Brain: No evidence of acute infarction, hemorrhage, hydrocephalus, extra-axial collection or mass lesion/mass effect. Chronic microvascular ischemic changes are noted bilaterally. Vascular: No hyperdense vessel or unexpected calcification. Skull: Normal. Negative for fracture or focal lesion. Sinuses/Orbits: There is mucosal thickening involving the bilateral maxillary sinuses, right worse than left. There is mucosal thickening of the ethmoid air cells. There is some opacification of the right sphenoid sinus. Otherwise, the remaining paranasal sinuses and mastoid air cells are essentially clear. The patient is status post bilateral cataract surgery. Other: None. IMPRESSION: No acute intracranial abnormality. Electronically Signed   By: Constance Holster M.D.   On: 04/11/2019 19:44   Mr Brain Wo Contrast  Result Date: 04/11/2019 CLINICAL DATA:  Slurred speech, tingling sensation in left hand. EXAM: MRI HEAD WITHOUT CONTRAST TECHNIQUE: Multiplanar, multiecho pulse sequences of the brain and surrounding structures were obtained without intravenous contrast. COMPARISON:  Head CT 04/11/2019 Brain MRI 03/16/2019 FINDINGS: BRAIN: There is no acute infarct, acute hemorrhage or extra-axial collection. The midline structures are normal. Early confluent hyperintense T2-weighted signal of the periventricular and deep white matter, most commonly due to chronic ischemic microangiopathy. Advanced atrophy for age. Two foci of chronic microhemorrhage in the left occipital lobe. No midline shift or other mass effect. VASCULAR: The major intracranial arterial and venous sinus flow voids are  normal. SKULL AND UPPER CERVICAL SPINE: Calvarial bone marrow signal is normal. There is no skull base mass. Visualized upper cervical spine and soft tissues are normal. SINUSES/ORBITS: Mild bilateral maxillary sinus mucosal thickening. Small amount of right mastoid fluid. The orbits are normal. IMPRESSION: Age advanced atrophy and chronic ischemic microangiopathy without acute intracranial abnormality. Electronically Signed   By: Ulyses Jarred M.D.   On: 04/11/2019 21:03    Procedures Procedures (including critical care time)  Medications Ordered in ED Medications  metoCLOPramide (REGLAN) injection 10 mg (10 mg Intravenous Given 04/11/19 2248)  labetalol (NORMODYNE) tablet 200 mg (200 mg Oral Given 04/12/19 0013)     Initial Impression / Assessment and Plan / ED Course  I have reviewed the triage vital signs and the nursing notes.  Pertinent labs & imaging results that were available during my care of the patient were reviewed by me and considered in my medical decision making (see chart for details).  Clinical Course as of Apr 11 936  Thu Apr 11, 2019  1928 Differential diagnosis includes stroke, TIA, psychosomatic, stress reaction, atypical migraine.   [MB]  1929 I reviewed the case with neurology on-call Dr.Kirkpatrick who recommends getting the patient an MRI without.   [MB]  2141 I reviewed the patient's results with her.  She is very upset that her blood pressure is  up now and she still has a headache.  We will give her some Reglan for her headache and recheck her blood pressure.  Anticipate will be able to be discharged if we can get her symptoms under control.   [MB]    Clinical Course User Index [MB] Hayden Rasmussen, MD        Final Clinical Impressions(s) / ED Diagnoses   Final diagnoses:  Generalized headache  Visual disturbance  Hypertension, unspecified type    ED Discharge Orders    None       Hayden Rasmussen, MD 04/12/19 [phone]

## 2019-04-11 NOTE — ED Notes (Signed)
Patient transported to MRI 

## 2019-04-11 NOTE — ED Notes (Signed)
Patient transported to CT scan . 

## 2019-04-11 NOTE — ED Triage Notes (Signed)
Arrived via EMS from home lives with husband LSN 1000 yesterday patient complaint of lethargy, left hand tingling, slurred speech, and ataxia. CBG 118. Pulse oximetry 86% room air increased to 100% 3L [place]. histroy of kidney transplant needs a month shot unknown name missed last month due to death in the family and suppose to receive the shot tomorrow at possibly [facility].

## 2019-04-12 MED ORDER — LABETALOL HCL 200 MG PO TABS
200.0000 mg | ORAL_TABLET | Freq: Once | ORAL | Status: AC
  Administered 2019-04-12: 200 mg via ORAL
  Filled 2019-04-12: qty 1

## 2019-04-22 ENCOUNTER — Telehealth: Payer: Medicare Other | Admitting: Cardiology

## 2019-07-18 DEATH — deceased
